# Patient Record
Sex: Male | Born: 1955
Health system: Southern US, Community
[De-identification: ages and names within clinical notes are randomized; demographics above are authoritative.]

## PROBLEM LIST (undated history)

## (undated) ENCOUNTER — Emergency Department: Admission: EM | Discharge status: Absent without leave | Payer: Medicare HMO

## (undated) DIAGNOSIS — C801 Malignant (primary) neoplasm, unspecified: Secondary | ICD-10-CM

## (undated) DIAGNOSIS — I071 Rheumatic tricuspid insufficiency: Secondary | ICD-10-CM

## (undated) DIAGNOSIS — C61 Malignant neoplasm of prostate: Secondary | ICD-10-CM

## (undated) DIAGNOSIS — K2970 Gastritis, unspecified, without bleeding: Secondary | ICD-10-CM

## (undated) DIAGNOSIS — R7303 Prediabetes: Secondary | ICD-10-CM

## (undated) DIAGNOSIS — I10 Essential (primary) hypertension: Secondary | ICD-10-CM

## (undated) DIAGNOSIS — I714 Abdominal aortic aneurysm, without rupture, unspecified: Secondary | ICD-10-CM

## (undated) DIAGNOSIS — I340 Nonrheumatic mitral (valve) insufficiency: Secondary | ICD-10-CM

## (undated) DIAGNOSIS — R319 Hematuria, unspecified: Secondary | ICD-10-CM

## (undated) DIAGNOSIS — I517 Cardiomegaly: Secondary | ICD-10-CM

## (undated) DIAGNOSIS — M1990 Unspecified osteoarthritis, unspecified site: Secondary | ICD-10-CM

## (undated) DIAGNOSIS — G4730 Sleep apnea, unspecified: Secondary | ICD-10-CM

## (undated) DIAGNOSIS — M5412 Radiculopathy, cervical region: Secondary | ICD-10-CM

## (undated) DIAGNOSIS — R252 Cramp and spasm: Secondary | ICD-10-CM

## (undated) DIAGNOSIS — K219 Gastro-esophageal reflux disease without esophagitis: Secondary | ICD-10-CM

## (undated) DIAGNOSIS — G500 Trigeminal neuralgia: Secondary | ICD-10-CM

## (undated) HISTORY — DX: Malignant neoplasm of prostate: C61

## (undated) HISTORY — PX: APPENDECTOMY: SHX54

## (undated) HISTORY — PX: HEMORROIDECTOMY: SUR656

---

## 2004-05-09 ENCOUNTER — Emergency Department: Payer: Self-pay | Admitting: Emergency Medicine

## 2004-06-27 ENCOUNTER — Ambulatory Visit: Payer: Self-pay | Admitting: Internal Medicine

## 2004-06-27 LAB — HM COLONOSCOPY

## 2005-07-08 ENCOUNTER — Ambulatory Visit: Payer: Self-pay | Admitting: Urology

## 2007-02-05 ENCOUNTER — Emergency Department: Payer: Self-pay | Admitting: Emergency Medicine

## 2007-09-23 ENCOUNTER — Other Ambulatory Visit: Payer: Self-pay

## 2007-09-24 ENCOUNTER — Inpatient Hospital Stay: Payer: Self-pay | Admitting: Surgery

## 2008-06-14 ENCOUNTER — Ambulatory Visit: Payer: Self-pay | Admitting: Family Medicine

## 2008-06-30 DIAGNOSIS — G4730 Sleep apnea, unspecified: Secondary | ICD-10-CM | POA: Insufficient documentation

## 2008-07-26 DIAGNOSIS — K219 Gastro-esophageal reflux disease without esophagitis: Secondary | ICD-10-CM | POA: Insufficient documentation

## 2008-07-26 DIAGNOSIS — M1990 Unspecified osteoarthritis, unspecified site: Secondary | ICD-10-CM | POA: Insufficient documentation

## 2008-07-31 DIAGNOSIS — Z87891 Personal history of nicotine dependence: Secondary | ICD-10-CM | POA: Insufficient documentation

## 2008-10-06 DIAGNOSIS — L723 Sebaceous cyst: Secondary | ICD-10-CM | POA: Insufficient documentation

## 2009-05-21 ENCOUNTER — Ambulatory Visit: Payer: Self-pay | Admitting: Family Medicine

## 2009-11-24 ENCOUNTER — Ambulatory Visit: Payer: Self-pay | Admitting: Internal Medicine

## 2009-11-26 DIAGNOSIS — M129 Arthropathy, unspecified: Secondary | ICD-10-CM | POA: Insufficient documentation

## 2009-12-24 DIAGNOSIS — R319 Hematuria, unspecified: Secondary | ICD-10-CM | POA: Insufficient documentation

## 2009-12-24 DIAGNOSIS — N390 Urinary tract infection, site not specified: Secondary | ICD-10-CM | POA: Insufficient documentation

## 2010-10-19 ENCOUNTER — Ambulatory Visit: Payer: Self-pay | Admitting: Family Medicine

## 2011-04-03 ENCOUNTER — Emergency Department: Payer: Self-pay | Admitting: Emergency Medicine

## 2011-06-21 ENCOUNTER — Observation Stay: Payer: Self-pay | Admitting: Internal Medicine

## 2011-06-21 DIAGNOSIS — R079 Chest pain, unspecified: Secondary | ICD-10-CM

## 2011-06-21 LAB — COMPREHENSIVE METABOLIC PANEL
Albumin: 4.4 g/dL (ref 3.4–5.0)
Alkaline Phosphatase: 98 U/L (ref 50–136)
Anion Gap: 9 (ref 7–16)
BUN: 20 mg/dL — ABNORMAL HIGH (ref 7–18)
Bilirubin,Total: 0.6 mg/dL (ref 0.2–1.0)
Calcium, Total: 9.4 mg/dL (ref 8.5–10.1)
Chloride: 105 mmol/L (ref 98–107)
Co2: 28 mmol/L (ref 21–32)
Creatinine: 1.19 mg/dL (ref 0.60–1.30)
EGFR (African American): >60
EGFR (Non-African Amer.): >60
Glucose: 154 mg/dL — ABNORMAL HIGH (ref 65–99)
Osmolality: 289 (ref 275–301)
Potassium: 4.5 mmol/L (ref 3.5–5.1)
SGOT(AST): 26 U/L (ref 15–37)
SGPT (ALT): 32 U/L
Sodium: 142 mmol/L (ref 136–145)
Total Protein: 7.7 g/dL (ref 6.4–8.2)

## 2011-06-21 LAB — CBC
HCT: 43.1 % (ref 40.0–52.0)
HGB: 14.4 g/dL (ref 13.0–18.0)
MCH: 27.5 pg (ref 26.0–34.0)
MCHC: 33.4 g/dL (ref 32.0–36.0)
MCV: 82 fL (ref 80–100)
Platelet: 213 10*3/uL (ref 150–440)
RBC: 5.23 10*6/uL (ref 4.40–5.90)
RDW: 13.6 % (ref 11.5–14.5)
WBC: 5.1 10*3/uL (ref 3.8–10.6)

## 2011-06-21 LAB — CK TOTAL AND CKMB (NOT AT ARMC)
CK, Total: 414 U/L — ABNORMAL HIGH (ref 35–232)
CK-MB: 1.6 ng/mL (ref 0.5–3.6)

## 2011-06-21 LAB — TROPONIN I: Troponin-I: <0.02 ng/mL

## 2011-06-21 LAB — PROTIME-INR
INR: 0.9
Prothrombin Time: 12.9 secs (ref 11.5–14.7)

## 2011-06-21 LAB — APTT: Activated PTT: 34.3 secs (ref 23.6–35.9)

## 2011-06-22 LAB — BASIC METABOLIC PANEL
Anion Gap: 11 (ref 7–16)
BUN: 25 mg/dL — ABNORMAL HIGH (ref 7–18)
Calcium, Total: 9.2 mg/dL (ref 8.5–10.1)
Chloride: 105 mmol/L (ref 98–107)
Co2: 28 mmol/L (ref 21–32)
Creatinine: 1.05 mg/dL (ref 0.60–1.30)
EGFR (African American): >60
EGFR (Non-African Amer.): >60
Glucose: 102 mg/dL — ABNORMAL HIGH (ref 65–99)
Osmolality: 291 (ref 275–301)
Potassium: 3.6 mmol/L (ref 3.5–5.1)
Sodium: 144 mmol/L (ref 136–145)

## 2011-06-22 LAB — CK TOTAL AND CKMB (NOT AT ARMC)
CK, Total: 375 U/L — ABNORMAL HIGH (ref 35–232)
CK, Total: 363 U/L — ABNORMAL HIGH (ref 35–232)
CK-MB: 1.6 ng/mL (ref 0.5–3.6)
CK-MB: 1.6 ng/mL (ref 0.5–3.6)

## 2011-06-22 LAB — CBC WITH DIFFERENTIAL/PLATELET
Basophil #: 0 10*3/uL (ref 0.0–0.1)
Basophil %: 0.7 %
Eosinophil #: 0.1 10*3/uL (ref 0.0–0.7)
Eosinophil %: 1.5 %
HCT: 42.4 % (ref 40.0–52.0)
HGB: 14.1 g/dL (ref 13.0–18.0)
Lymphocyte #: 3.2 10*3/uL (ref 1.0–3.6)
Lymphocyte %: 57.2 %
MCH: 27.3 pg (ref 26.0–34.0)
MCHC: 33.2 g/dL (ref 32.0–36.0)
MCV: 82 fL (ref 80–100)
Monocyte #: 0.6 10*3/uL (ref 0.0–0.7)
Monocyte %: 10.5 %
Neutrophil #: 1.7 10*3/uL (ref 1.4–6.5)
Neutrophil %: 30.1 %
Platelet: 207 10*3/uL (ref 150–440)
RBC: 5.16 10*6/uL (ref 4.40–5.90)
RDW: 13.3 % (ref 11.5–14.5)
WBC: 5.6 10*3/uL (ref 3.8–10.6)

## 2011-06-22 LAB — LIPID PANEL
Cholesterol: 178 mg/dL (ref 0–200)
HDL Cholesterol: 46 mg/dL (ref 40–60)
Ldl Cholesterol, Calc: 110 mg/dL — ABNORMAL HIGH (ref 0–100)
Triglycerides: 109 mg/dL (ref 0–200)
VLDL Cholesterol, Calc: 22 mg/dL (ref 5–40)

## 2011-06-22 LAB — TROPONIN I
Troponin-I: <0.02 ng/mL
Troponin-I: <0.02 ng/mL

## 2011-06-22 LAB — TSH: Thyroid Stimulating Horm: 2.1 u[IU]/mL

## 2011-06-22 LAB — HEMOGLOBIN A1C: Hemoglobin A1C: 6.6 % — ABNORMAL HIGH (ref 4.2–6.3)

## 2011-06-30 ENCOUNTER — Ambulatory Visit: Payer: Self-pay | Admitting: Family Medicine

## 2011-10-30 ENCOUNTER — Emergency Department: Payer: Self-pay

## 2012-01-02 ENCOUNTER — Ambulatory Visit: Payer: Self-pay | Admitting: Family Medicine

## 2012-09-04 ENCOUNTER — Ambulatory Visit: Payer: Self-pay | Admitting: Emergency Medicine

## 2012-12-31 ENCOUNTER — Ambulatory Visit: Payer: Self-pay | Admitting: Otolaryngology

## 2012-12-31 LAB — CREATININE, SERUM
Creatinine: 1.11 mg/dL (ref 0.60–1.30)
EGFR (African American): >60
EGFR (Non-African Amer.): >60

## 2013-07-05 ENCOUNTER — Ambulatory Visit: Payer: Self-pay | Admitting: Family Medicine

## 2014-07-06 LAB — CBC AND DIFFERENTIAL
HCT: 47 % (ref 41–53)
Hemoglobin: 15.6 g/dL (ref 13.5–17.5)
Neutrophils Absolute: 2 /uL
Platelets: 195 10*3/uL (ref 150–399)
WBC: 4 10^3/mL

## 2014-07-06 LAB — BASIC METABOLIC PANEL
BUN: 15 mg/dL (ref 4–21)
Creatinine: 1.1 mg/dL (ref 0.6–1.3)
Glucose: 98 mg/dL
Potassium: 4.8 mmol/L (ref 3.4–5.3)
Sodium: 142 mmol/L (ref 137–147)

## 2014-07-06 LAB — LIPID PANEL
Cholesterol: 161 mg/dL (ref 0–200)
HDL: 52 mg/dL (ref 35–70)
LDL Cholesterol: 86 mg/dL
LDl/HDL Ratio: 1.7
Triglycerides: 116 mg/dL (ref 40–160)

## 2014-07-06 LAB — HEPATIC FUNCTION PANEL
ALT: 27 U/L (ref 10–40)
AST: 26 U/L (ref 14–40)
Alkaline Phosphatase: 86 U/L (ref 25–125)

## 2014-07-06 LAB — TSH: TSH: 1.23 u[IU]/mL (ref 0.41–5.90)

## 2014-07-06 LAB — PSA: PSA: 2.7

## 2014-08-20 ENCOUNTER — Emergency Department: Payer: Self-pay | Admitting: Internal Medicine

## 2014-09-17 NOTE — Discharge Summary (Signed)
PATIENT NAME:  Jack Weaver, Jack Weaver MR#:  [id number] DATE OF BIRTH:  [birth year]  DATE OF ADMISSION:  06/21/2011 DATE OF DISCHARGE:  06/22/2011  DISCHARGE DIAGNOSES:  1. Palpitations.  2. Hypertension.  3. Prediabetes.  4. Hyperlipidemia.   DISPOSITION: The patient is being discharged home.   FOLLOW-UP: Follow-up with primary care physician, Dr. Rosanna Randy, and with Dr. Clayborn Bigness in one to two weeks after discharge.   DIET: Low sodium, ADA, low fat.   DISCHARGE MEDICATIONS:  1. Flonase 50 mcg two sprays each nostril daily.  2. Aspirin 81 mg daily.  3. Lopressor 25 mg daily.  4. Enalapril 5 mg daily.   CONSULTATION: Cardiology consultation with Dr. Clayborn Bigness   LABORATORY, DIAGNOSTIC, AND RADIOLOGICAL DATA: Chest x-ray showed no abnormalities. CBC normal. Cardiac enzymes x3 normal. Hemoglobin A1c 6.6. LDL 110. Rest of cholesterol profile normal. TSH 2.10.   HOSPITAL COURSE: The patient is a 59 year old male with past medical history of hypertension who presented with palpitations. He reported having palpitations for several months. He reports being told that he had PVCs. He came to the ED because he was having excessive palpitations. The patient was admitted to the hospital and placed on telemetry. On telemetry he had no events on telemetry. Three sets of cardiac enzymes were negative. TSH was normal. The patient was evaluated by Dr. Clayborn Bigness who recommended outpatient follow-up in his office. The patient's hypertension was well controlled. He was taking HCTZ and amlodipine at home. He has been switched to beta-blocker and ACE inhibitor given his symptoms. The patient had hyperglycemia and a hemoglobin A1c was checked which was found to be 6.6, possibly prediabetic. He has been advised about diet control. A fasting lipid profile was also checked and the patient's LDL was elevated. He has been advised about diet control. Will defer initiation of statin therapy to his PCP.  DISPOSITION: The patient is  being discharged home in a stable condition.   TIME SPENT: 45 minutes.   ____________________________ Cherre Huger, MD sp:drc D: 06/22/2011 15:41:44 ET T: 06/23/2011 10:43:54 ET JOB#: [id number]  cc: Cherre Huger, MD, <Dictator> Cherre Huger MD ELECTRONICALLY SIGNED 06/23/2011 15:11

## 2014-09-17 NOTE — H&P (Signed)
PATIENT NAME:  Jack Weaver, Jack Weaver MR#:  [id number] DATE OF BIRTH:  02/15/1956  DATE OF ADMISSION:  06/21/2011  PRIMARY CARE PHYSICIAN: Richard L. Rosanna Randy, MD   CHIEF COMPLAINT: Chest pressure.   HISTORY OF PRESENT ILLNESS: Mr. Luiz Blare is a 59 year old gentleman with hypertension who has been having chest tightness and palpitations for the past six years. He states that over the past six years the feeling of fluttering in his chest and chest tightness have come and gone. However, since November it has been more frequent and he presented to the Emergency Department. He was told he was having PVCs and sent home. He typically has them twice a day, and they are not associated with any nausea or vomiting, chest pain with radiation. They are not associated with activity. He runs 5 to 10 miles daily with no symptoms. He has worn a Holter monitor with no findings. He states that last night the fluttering in his chest and the tightness was coming back to back and very severe, but he did not come to the Emergency Department secondary to the weather. He finally came in today and states that he has only had about four episodes.   REVIEW OF SYSTEMS: CONSTITUTIONAL: He denies any weakness, weight loss or fatigue. No fevers or night sweats. HEENT: No changes in his vision, blurry vision, double vision. No changes in his hearing. No headaches or dizziness. No dysphagia, odynophagia or postnasal drip. CARDIORESPIRATORY: No shortness of breath. No chest pain, including pleuritic chest pain or wheezing. No edema, orthopnea, or PND. GASTROINTESTINAL: No nausea, vomiting, diarrhea, or constipation. No abdominal pain. HEMATOLOGIC/LYMPHATIC: No easy bruising, easy bleeding or swollen glands. ENDOCRINE: No polyuria, polydipsia or polyphagia. No heat or cold intolerance. MUSCULOSKELETAL: No weakness or swollen joints. No paresthesias. INTEGUMENTARY: No changes in his skin, hair, or nails. No rashes or lesions. PSYCHIATRIC: No anxiety or  depression.   PAST MEDICAL HISTORY: Hypertension   FAMILY HISTORY: Significant for a myocardial infarction in his mother in her 73s and hypertension in his father. No hyperlipidemia. No sudden cardiac death.   SOCIAL HISTORY: He lives with his wife. He smokes one cigar per day. Occasional alcohol. He denies any illicit drugs.   CODE STATUS:  He is a FULL CODE.    ALLERGIES: He has no known drug allergies.   HOME MEDICATIONS:  1. Naproxen 500 mg p.o. b.i.d. 2. Meloxicam 15 mg p.o. daily.  3. HCTZ 12.5 mg p.o. daily.  4. Amlodipine 10 mg p.o. daily.  5. Fluticasone nasal spray once a day.   PHYSICAL EXAMINATION:  VITAL SIGNS: On admission, temperature is 98.2, pulse is 61, respiratory rate 20, blood pressure 132/85, oxygen saturation is 96% on room air.   GENERAL: He is in no acute distress, well-developed, well-nourished.   HEENT: Conjunctivae and lids are without erythema or pallor. Pupils are equally round and reactive to light and accommodation. Sclera are injected but anicteric. External ears are without lesions or masses. Hearing is intact to whispered voice. Oral mucosa is moist. Posterior pharynx is clear without exudate.   NECK: Supple without masses. Thyroid is not enlarged. There are no nodules.   LUNGS: He is not in respiratory distress. No intercostal retractions or use of accessory muscles. Lungs are clear throughout. No wheezing, rales or rhonchi.   HEART: Heart is regular. No murmurs, rubs, or gallops. Pedal pulses are 2+ and equal bilaterally. No lower extremity edema.   ABDOMEN: Soft, nontender, nondistended. Liver and spleen are not enlarged.  LYMPH NODES: No axillary or cervical lymphadenopathy.   EXTREMITIES: Full range of motion in all four extremities with good strength and tone. No clubbing, cyanosis or edema.  SKIN: Skin and subcutaneous tissue are without rashes or lesions. Palpation of the skin reveals no induration or nodules.   NEUROLOGIC: Cranial  nerves II through XII are grossly intact. Deep tendon reflexes are 2+ and equal bilaterally.   PSYCHIATRIC: Judgment and insight are intact. The patient is oriented to person, place, and time.   LABORATORY, DIAGNOSTIC AND RADIOLOGICAL DATA:  White blood cell count 5.1, hematocrit 43.1, platelet count 213.  Sodium 142, potassium 4.5, chloride 105, bicarbonate 28, BUN 20, creatinine 1.2, glucose 154. INR is flat. Troponin is less than 0.02. CK is 414, MB is 1.6.  EKG is normal sinus rhythm with a PVC and what looks to be voltage criteria for left ventricular hypertrophy with a strain pattern.   ASSESSMENT/PLAN: A 59 year old man with hypertension who presents with palpitations and chest pressure.   1. Chest pressure and palpitations: We will keep him on telemetry. Lovenox, aspirin, beta blocker and ACE inhibitor. Cardiology consult. He could possibly benefit from a stress test, although he does one on a regular basis.  2. Hypertension: I did hold his Norvasc and HCTZ given that he is on a beta blocker and an ACE inhibitor while being worked up for angina. He does have an enlarged heart on EKG. He says he had an echocardiogram, but that is not found in the system, and he may benefit from one at this point.   TIME SPENT:   Total time spent on care and coordination is 40 minutes.    ____________________________ Stephani Police Owens Shark, MD jlb:cbb D: 06/21/2011 91:66:06 ET T: 06/22/2011 10:25:45 ET JOB#: [id number]  cc: Anderson Malta L. Owens Shark, MD, <Dictator> Richard L. Rosanna Randy, MD Osa Craver MD ELECTRONICALLY SIGNED 06/24/2011 13:37

## 2014-11-11 ENCOUNTER — Other Ambulatory Visit: Payer: Self-pay | Admitting: Family Medicine

## 2014-11-14 ENCOUNTER — Other Ambulatory Visit: Payer: Self-pay | Admitting: Family Medicine

## 2014-11-14 ENCOUNTER — Encounter: Payer: Self-pay | Admitting: Family Medicine

## 2014-11-14 DIAGNOSIS — J309 Allergic rhinitis, unspecified: Secondary | ICD-10-CM | POA: Insufficient documentation

## 2014-11-18 DIAGNOSIS — I10 Essential (primary) hypertension: Secondary | ICD-10-CM | POA: Insufficient documentation

## 2014-11-18 DIAGNOSIS — N529 Male erectile dysfunction, unspecified: Secondary | ICD-10-CM | POA: Insufficient documentation

## 2014-11-18 DIAGNOSIS — G629 Polyneuropathy, unspecified: Secondary | ICD-10-CM | POA: Insufficient documentation

## 2014-11-26 ENCOUNTER — Other Ambulatory Visit: Payer: Self-pay | Admitting: Family Medicine

## 2014-12-05 ENCOUNTER — Ambulatory Visit: Payer: Self-pay | Admitting: Family Medicine

## 2015-01-15 ENCOUNTER — Encounter: Payer: Self-pay | Admitting: Emergency Medicine

## 2015-01-15 ENCOUNTER — Emergency Department
Admission: EM | Admit: 2015-01-15 | Discharge: 2015-01-15 | Disposition: A | Discharge status: Home / Self Care | Payer: Commercial Indemnity | Attending: Emergency Medicine | Admitting: Emergency Medicine

## 2015-01-15 DIAGNOSIS — Z720 Tobacco use: Secondary | ICD-10-CM | POA: Diagnosis not present

## 2015-01-15 DIAGNOSIS — H8110 Benign paroxysmal vertigo, unspecified ear: Secondary | ICD-10-CM | POA: Insufficient documentation

## 2015-01-15 DIAGNOSIS — Z79899 Other long term (current) drug therapy: Secondary | ICD-10-CM | POA: Diagnosis not present

## 2015-01-15 DIAGNOSIS — R42 Dizziness and giddiness: Secondary | ICD-10-CM | POA: Diagnosis present

## 2015-01-15 DIAGNOSIS — Z7951 Long term (current) use of inhaled steroids: Secondary | ICD-10-CM | POA: Insufficient documentation

## 2015-01-15 LAB — BASIC METABOLIC PANEL
Anion gap: 7 (ref 5–15)
BUN: 23 mg/dL — ABNORMAL HIGH (ref 6–20)
CO2: 29 mmol/L (ref 22–32)
Calcium: 9.3 mg/dL (ref 8.9–10.3)
Chloride: 108 mmol/L (ref 101–111)
Creatinine, Ser: 1.12 mg/dL (ref 0.61–1.24)
GFR calc Af Amer: >60 mL/min (ref >60)
GFR calc non Af Amer: >60 mL/min (ref >60)
Glucose, Bld: 116 mg/dL — ABNORMAL HIGH (ref 65–99)
Potassium: 3.8 mmol/L (ref 3.5–5.1)
Sodium: 144 mmol/L (ref 135–145)

## 2015-01-15 LAB — CBC
HCT: 41.6 % (ref 40.0–52.0)
Hemoglobin: 13.6 g/dL (ref 13.0–18.0)
MCH: 27.4 pg (ref 26.0–34.0)
MCHC: 32.8 g/dL (ref 32.0–36.0)
MCV: 83.7 fL (ref 80.0–100.0)
Platelets: 164 10*3/uL (ref 150–440)
RBC: 4.97 MIL/uL (ref 4.40–5.90)
RDW: 13.6 % (ref 11.5–14.5)
WBC: 6.1 10*3/uL (ref 3.8–10.6)

## 2015-01-15 LAB — URINALYSIS COMPLETE WITH MICROSCOPIC (ARMC ONLY)
Bacteria, UA: NONE SEEN
Bilirubin Urine: NEGATIVE
Glucose, UA: NEGATIVE mg/dL
Leukocytes, UA: NEGATIVE
Nitrite: NEGATIVE
Protein, ur: NEGATIVE mg/dL
Specific Gravity, Urine: 1.019 (ref 1.005–1.030)
pH: 7 (ref 5.0–8.0)

## 2015-01-15 MED ORDER — PROMETHAZINE HCL 12.5 MG PO TABS: 12.5000 mg | ORAL_TABLET | Freq: Four times a day (QID) | ORAL | Status: DC | PRN

## 2015-01-15 MED ORDER — MECLIZINE HCL 25 MG PO TABS
25.0000 mg | ORAL_TABLET | Freq: Once | ORAL | Status: AC
  Administered 2015-01-15: 25 mg via ORAL
  Filled 2015-01-15: qty 1

## 2015-01-15 MED ORDER — PROMETHAZINE HCL 25 MG PO TABS
12.5000 mg | ORAL_TABLET | Freq: Once | ORAL | Status: AC
  Administered 2015-01-15: 12.5 mg via ORAL
  Filled 2015-01-15: qty 1

## 2015-01-15 MED ORDER — ONDANSETRON 4 MG PO TBDP
4.0000 mg | ORAL_TABLET | Freq: Once | ORAL | Status: AC
  Administered 2015-01-15: 4 mg via ORAL
  Filled 2015-01-15: qty 1

## 2015-01-15 MED ORDER — MECLIZINE HCL 25 MG PO TABS
25.0000 mg | ORAL_TABLET | Freq: Three times a day (TID) | ORAL | Status: DC | PRN
Start: 2015-01-15 — End: 2015-02-06

## 2015-01-15 NOTE — ED Notes (Signed)
Pt brought to triage via wheelchair from the lobby bathroom where pt had gone to vomit. Registration clerk reports pt has been in the bathroom vomiting. Pt reports started around midnight with feeling dizzy and then started vomiting.

## 2015-01-15 NOTE — ED Provider Notes (Signed)
Time Seen: Approximately ----------------------------------------- 7:28 AM on 01/15/2015 -----------------------------------------    I have reviewed the triage notes  Chief Complaint: Dizziness   History of Present Illness: Jack Weaver is a 59 y.o. male who presents with acute onset of vertiginous symptoms. Patient states he felt fine up until 12:30 this morning. He states he is feeling symptomatically improved at this point as far as his nausea and vomiting goes which was rather persistent. Patient received a Zofran to triage area. He denies any head trauma, ringing in the ears, trouble with speech or swallowing, he denies any focal weakness either upper or lower extremities. He denies any ear pain or deafness. He denies any facial pain or facial weakness. He denies any recent tick bite or exposure tickborne disease. He states he had persistent vomiting without any hematemesis or biliary emesis. He denies any chest pain or shortness of breath, visual acuity issues etc. Feelings of being off balance or only with movement and her exhaustive when the patient holds still.   History reviewed. No pertinent past medical history.  Patient Active Problem List   Diagnosis Date Noted  . ED (erectile dysfunction) of organic origin 11/18/2014  . BP (high blood pressure) 11/18/2014  . Neuropathy 11/18/2014  . Allergic rhinitis 11/14/2014  . Arthropathia 11/26/2009  . History of tobacco use 07/31/2008  . Acid reflux 07/26/2008  . Arthritis, degenerative 07/26/2008  . Apnea, sleep 06/30/2008    Past Surgical History  Procedure Laterality Date  . Hemorroidectomy  1930  . Appendectomy      At age 41    Past Surgical History  Procedure Laterality Date  . Hemorroidectomy  1930  . Appendectomy      At age 33    Current Outpatient Rx  Name  Route  Sig  Dispense  Refill  . diltiazem (DILACOR XR) 120 MG 24 hr capsule   Oral   Take 1 capsule by mouth daily.         . fluticasone  (FLONASE) 50 MCG/ACT nasal spray      USE 2 SPRAYS EACH NOSTRIL EVERY DAY   16 g   3   . lisinopril (PRINIVIL,ZESTRIL) 40 MG tablet   Oral   Take 1 tablet by mouth daily.         . meloxicam (MOBIC) 15 MG tablet      TAKE 1 TABLET BY MOUTH EVERY DAY AFTER MEALS      3   . metoprolol succinate (TOPROL-XL) 25 MG 24 hr tablet   Oral   Take 25 mg by mouth daily.      12   . MULTIPLE VITAMIN PO   Oral   Take 1 tablet by mouth daily.         . naproxen (NAPROSYN) 500 MG tablet   Oral   Take 1 tablet by mouth 2 (two) times daily as needed.         . sildenafil (VIAGRA) 100 MG tablet   Oral   Take 1 tablet by mouth daily as needed.         . tadalafil (CIALIS) 20 MG tablet   Oral   Take 20 mg by mouth daily as needed for erectile dysfunction.         . traMADol (ULTRAM) 50 MG tablet   Oral   Take 50 mg by mouth every 6 (six) hours as needed. for pain      0     Allergies:  Review of  patient's allergies indicates no known allergies.  Family History: Family History  Problem Relation Age of Onset  . Heart attack Mother   . Stroke Father   . Asthma Father   . Cancer Brother   . Diabetes Brother     Social History: Social History  Substance Use Topics  . Smoking status: Light Tobacco Smoker  . Smokeless tobacco: None     Comment: quit in 06/2013. Pt recently started 1 cigarette daily.  . Alcohol Use: 0.0 oz/week    0 Standard drinks or equivalent per week     Review of Systems:   10 point review of systems was performed and was otherwise negative:  Constitutional: No fever Eyes: No visual disturbances ENT: No sore throat, ear pain Cardiac: No chest pain Respiratory: No shortness of breath, wheezing, or stridor Abdomen: No abdominal pain, no vomiting, No diarrhea Endocrine: No weight loss, No night sweats Extremities: No peripheral edema, cyanosis Skin: No rashes, easy bruising Neurologic: No focal weakness, trouble with speech or  swollowing Urologic: No dysuria, Hematuria, or urinary frequency   Physical Exam:  ED Triage Vitals  Enc Vitals Group     BP 01/15/15 0207 135/89 mmHg     Pulse Rate 01/15/15 0207 59     Resp 01/15/15 0207 18     Temp 01/15/15 0207 97.8 F (36.6 C)     Temp Source 01/15/15 0207 Oral     SpO2 01/15/15 0207 100 %     Weight 01/15/15 0207 202 lb (91.627 kg)     Height 01/15/15 0207 5\' 11"  (1.803 m)     Head Cir --      Peak Flow --      Pain Score 01/15/15 0208 0     Pain Loc --      Pain Edu? --      Excl. in [place]? --     General: Awake , Alert , and Oriented times 3; GCS 15 Head: Normal cephalic , atraumatic Eyes: Pupils equal , round, reactive to light Nose/Throat: No nasal drainage, patent upper airway without erythema or exudate.  Neck: Supple, Full range of motion, No anterior adenopathy or palpable thyroid masses Lungs: Clear to ascultation without wheezes , rhonchi, or rales Heart: Regular rate, regular rhythm without murmurs , gallops , or rubs Abdomen: Soft, non tender without rebound, guarding , or rigidity; bowel sounds positive and symmetric in all 4 quadrants. No organomegaly .        Extremities: 2 plus symmetric pulses. No edema, clubbing or cyanosis Neurologic: normal ambulation, Motor symmetric without deficits, sensory intact Skin: warm, dry, no rashes   Labs:   All laboratory work was reviewed including any pertinent negatives or positives listed below:  [place] - Abnormal; Notable for the following:    Glucose, Bld 116 (*)    BUN 23 (*)    All other components within normal limits  URINALYSIS COMPLETEWITH MICROSCOPIC ([facility] ONLY) - Abnormal; Notable for the following:    Color, Urine YELLOW (*)    APPearance CLEAR (*)    Ketones, ur TRACE (*)    Hgb urine dipstick 1+ (*)    Squamous Epithelial / LPF 0-5 (*)    All other components within normal limits  CBC   laboratory work was reviewed and showed no significant  abnormalities  EKG:ED ECG REPORT I, Daymon Larsen, the attending physician, personally viewed and interpreted this ECG.  Date: 01/15/2015 EKG Time: 0221 Rate: 53  Rhythm: normal sinus rhythm QRS Axis: normal Intervals: normal ST/T Wave abnormalities: normal Conduction Disutrbances: none Narrative Interpretation: unremarkable     Procedures: Patient had Epley maneuver with reproduction of symptoms   Critical Care: None    ED Course:  Patient was symptomatically improved though still had some vertiginous symptoms but most of his nausea and vomiting had resolved. His differential narrowed to vertigo versus non-syncope. Given his clinical presentation and objective findings I felt most likely this was dizziness consistent with benign positional vertigo. I do not clinically suspect any central vertigo given exhaustive nature of his symptoms reproduction of his symptoms with Epley maneuver and a nonfocal neurologic exam. Patient was referred back to his primary physician and given a prescription for Phenergan and Antivert.    Assessment: Benign positional vertigo   Final Clinical Impression: Benign positional vertigo Final diagnoses:  None     Plan: Patient was discharged with vertigo instructions. Advised drink plenty of fluids and advance diet as tolerated. He is advised not to drive until symptoms resolve. He was given a work note.          Daymon Larsen, MD 01/15/15 [phone]

## 2015-01-15 NOTE — Discharge Instructions (Signed)
Benign Positional Vertigo Vertigo means you feel like you or your surroundings are moving when they are not. Benign positional vertigo is the most common form of vertigo. Benign means that the cause of your condition is not serious. Benign positional vertigo is more common in older adults. CAUSES  Benign positional vertigo is the result of an upset in the labyrinth system. This is an area in the middle ear that helps control your balance. This may be caused by a viral infection, head injury, or repetitive motion. However, often no specific cause is found. SYMPTOMS  Symptoms of benign positional vertigo occur when you move your head or eyes in different directions. Some of the symptoms may include:  Loss of balance and falls.  Vomiting.  Blurred vision.  Dizziness.  Nausea.  Involuntary eye movements (nystagmus). DIAGNOSIS  Benign positional vertigo is usually diagnosed by physical exam. If the specific cause of your benign positional vertigo is unknown, your caregiver may perform imaging tests, such as magnetic resonance imaging (MRI) or computed tomography (CT). TREATMENT  Your caregiver may recommend movements or procedures to correct the benign positional vertigo. Medicines such as meclizine, benzodiazepines, and medicines for nausea may be used to treat your symptoms. In rare cases, if your symptoms are caused by certain conditions that affect the inner ear, you may need surgery. HOME CARE INSTRUCTIONS   Follow your caregiver's instructions.  Move slowly. Do not make sudden body or head movements.  Avoid driving.  Avoid operating heavy machinery.  Avoid performing any tasks that would be dangerous to you or others during a vertigo episode.  Drink enough fluids to keep your urine clear or pale yellow. SEEK IMMEDIATE MEDICAL CARE IF:   You develop problems with walking, weakness, numbness, or using your arms, hands, or legs.  You have difficulty speaking.  You develop  severe headaches.  Your nausea or vomiting continues or gets worse.  You develop visual changes.  Your family or friends notice any behavioral changes.  Your condition gets worse.  You have a fever.  You develop a stiff neck or sensitivity to light. MAKE SURE YOU:   Understand these instructions.  Will watch your condition.  Will get help right away if you are not doing well or get worse. Document Released: 02/17/2006 Document Revised: 08/04/2011 Document Reviewed: 01/30/2011 [facility] Patient Information 2015 [place], [place]. This information is not intended to replace advice given to you by your health care provider. Make sure you discuss any questions you have with your health care provider.   Please return immediately if condition worsens. Please contact her primary physician or the physician you were given for referral. If you have any specialist physicians involved in her treatment and plan please also contact them. Thank you for using [facility].

## 2015-01-26 DIAGNOSIS — J301 Allergic rhinitis due to pollen: Secondary | ICD-10-CM | POA: Insufficient documentation

## 2015-01-26 DIAGNOSIS — R42 Dizziness and giddiness: Secondary | ICD-10-CM | POA: Insufficient documentation

## 2015-02-06 ENCOUNTER — Ambulatory Visit (INDEPENDENT_AMBULATORY_CARE_PROVIDER_SITE_OTHER): Payer: Commercial Indemnity | Admitting: Family Medicine

## 2015-02-06 ENCOUNTER — Encounter: Payer: Self-pay | Admitting: Family Medicine

## 2015-02-06 VITALS — BP 118/64 | HR 54 | Temp 98.2°F | Resp 16 | Wt 212.0 lb

## 2015-02-06 DIAGNOSIS — J018 Other acute sinusitis: Secondary | ICD-10-CM | POA: Diagnosis not present

## 2015-02-06 DIAGNOSIS — R42 Dizziness and giddiness: Secondary | ICD-10-CM

## 2015-02-06 MED ORDER — MECLIZINE HCL 25 MG PO TABS: 25.0000 mg | ORAL_TABLET | Freq: Three times a day (TID) | ORAL | Status: AC | PRN

## 2015-02-06 MED ORDER — CLARITHROMYCIN 500 MG PO TABS: 500.0000 mg | ORAL_TABLET | Freq: Two times a day (BID) | ORAL | Status: DC

## 2015-02-06 MED ORDER — PREDNISONE 10 MG (48) PO TBPK: ORAL_TABLET | Freq: Every day | ORAL | Status: DC

## 2015-02-06 NOTE — Progress Notes (Signed)
Patient ID: Jack Weaver, male   DOB: 14-Dec-1955, 59 y.o.   MRN: [id number]    Subjective:  HPI Pt reports that he is dizzy when he moves his head from side to side or up and down. He was seen by ENT on 01/26/15 and they treated him for sinusitis with Augmentin and prednisone taper. He reports that did not help. Per pt they tested him for vertigo but said he did not have that. He took Meclizine that they gave him and he was better, but he ran out of that. He also reports that he has sinus pain/ pressure above his left eye. Pt reports that he is only dizzy when he turns his head.  Prior to Admission medications   Medication Sig Start Date End Date Taking? Authorizing Provider  diltiazem (DILACOR XR) 120 MG 24 hr capsule Take 1 capsule by mouth daily. 10/16/14  Yes Historical Provider, MD  fluticasone (FLONASE) 50 MCG/ACT nasal spray USE 2 SPRAYS EACH NOSTRIL EVERY DAY 11/28/14  Yes Dennis E Chrismon, PA  lisinopril (PRINIVIL,ZESTRIL) 40 MG tablet Take 1 tablet by mouth daily. 08/03/14  Yes Historical Provider, MD  meloxicam (MOBIC) 15 MG tablet TAKE 1 TABLET BY MOUTH EVERY DAY AFTER MEALS 10/16/14  Yes Historical Provider, MD  MULTIPLE VITAMIN PO Take 1 tablet by mouth daily.   Yes Historical Provider, MD    Patient Active Problem List   Diagnosis Date Noted  . ED (erectile dysfunction) of organic origin 11/18/2014  . BP (high blood pressure) 11/18/2014  . Neuropathy 11/18/2014  . Allergic rhinitis 11/14/2014  . Arthropathia 11/26/2009  . History of tobacco use 07/31/2008  . Acid reflux 07/26/2008  . Arthritis, degenerative 07/26/2008  . Apnea, sleep 06/30/2008    History reviewed. No pertinent past medical history.  Social History   Social History  . Marital Status: Married    Spouse Name: N/A  . Number of Children: N/A  . Years of Education: N/A   Occupational History  . Not on file.   Social History Main Topics  . Smoking status: Current Some Day Smoker  . Smokeless tobacco:  Not on file     Comment: quit in 06/2013. Pt recently started 1 cigarette daily. Now smokes once a week.  . Alcohol Use: 0.0 oz/week    0 Standard drinks or equivalent per week     Comment: occasionally  . Drug Use: No  . Sexual Activity: Not on file   Other Topics Concern  . Not on file   Social History Narrative    No Known Allergies  Review of Systems  Constitutional: Negative.   Eyes: Negative.   Respiratory: Negative.   Cardiovascular: Negative.   Gastrointestinal: Negative.   Genitourinary: Negative.   Musculoskeletal: Negative.   Skin: Negative.   Neurological: Positive for dizziness and headaches.  Endo/Heme/Allergies: Negative.   Psychiatric/Behavioral: Negative.     Immunization History  Administered Date(s) Administered  . Td 05/26/1989  . Tdap 12/24/2009   Objective:  BP 118/64 mmHg  Pulse 54  Temp(Src) 98.2 F (36.8 C) (Oral)  Resp 16  Wt 212 lb (96.163 kg)  Physical Exam  Constitutional: He is oriented to person, place, and time and well-developed, well-nourished, and in no distress.  HENT:  Head: Normocephalic and atraumatic.  Right Ear: External ear normal.  Left Ear: External ear normal.  Nose: Nose normal.  Mouth/Throat: Oropharynx is clear and moist.  Eyes: Conjunctivae are normal.  Neck: Neck supple.  Cardiovascular: Normal rate, regular  rhythm and normal heart sounds.   Pulmonary/Chest: Effort normal and breath sounds normal.  Abdominal: Soft.  Neurological: He is alert and oriented to person, place, and time. Gait normal.  Skin: Skin is warm and dry.  Psychiatric: Mood, memory, affect and judgment normal.    Lab Results  Component Value Date   WBC 6.1 01/15/2015   HGB 13.6 01/15/2015   HCT 41.6 01/15/2015   PLT 164 01/15/2015   GLUCOSE 116* 01/15/2015   CHOL 161 07/06/2014   TRIG 116 07/06/2014   HDL 52 07/06/2014   LDLCALC 86 07/06/2014   TSH 1.23 07/06/2014   PSA 2.7 07/06/2014   INR 0.9 06/21/2011   HGBA1C 6.6*  06/22/2011    CMP     Component Value Date/Time   NA 144 01/15/2015 0234   NA 142 07/06/2014   NA 144 06/22/2011 0025   K 3.8 01/15/2015 0234   K 3.6 06/22/2011 0025   CL 108 01/15/2015 0234   CL 105 06/22/2011 0025   CO2 29 01/15/2015 0234   CO2 28 06/22/2011 0025   GLUCOSE 116* 01/15/2015 0234   GLUCOSE 102* 06/22/2011 0025   BUN 23* 01/15/2015 0234   BUN 15 07/06/2014   BUN 25* 06/22/2011 0025   CREATININE 1.12 01/15/2015 0234   CREATININE 1.1 07/06/2014   CREATININE 1.11 12/31/2012 0708   CALCIUM 9.3 01/15/2015 0234   CALCIUM 9.2 06/22/2011 0025   PROT 7.7 06/21/2011 1622   ALBUMIN 4.4 06/21/2011 1622   AST 26 07/06/2014   AST 26 06/21/2011 1622   ALT 27 07/06/2014   ALT 32 06/21/2011 1622   ALKPHOS 86 07/06/2014   ALKPHOS 98 06/21/2011 1622   BILITOT 0.6 06/21/2011 1622   GFRNONAA >60 01/15/2015 0234   GFRNONAA >60 12/31/2012 0708   GFRNONAA >60 06/22/2011 0025   GFRAA >60 01/15/2015 0234   GFRAA >60 12/31/2012 0708   GFRAA >60 06/22/2011 0025    Assessment and Plan :  1. Vertigo  - meclizine (ANTIVERT) 25 MG tablet; Take 1 tablet (25 mg total) by mouth 3 (three) times daily as needed for dizziness.  Dispense: 100 tablet; Refill: 0  2. Other acute sinusitis  - clarithromycin (BIAXIN) 500 MG tablet; Take 1 tablet (500 mg total) by mouth 2 (two) times daily.  Dispense: 30 tablet; Refill: 0 - predniSONE (STERAPRED UNI-PAK 48 TAB) 10 MG (48) TBPK tablet; Take by mouth daily. Use as directed.  Dispense: 48 tablet; Refill: [place] MD [facility] 02/06/2015 3:38 PM

## 2015-02-18 ENCOUNTER — Encounter (HOSPITAL_BASED_OUTPATIENT_CLINIC_OR_DEPARTMENT_OTHER): Payer: Self-pay | Admitting: *Deleted

## 2015-02-18 ENCOUNTER — Emergency Department (HOSPITAL_BASED_OUTPATIENT_CLINIC_OR_DEPARTMENT_OTHER)
Admission: EM | Admit: 2015-02-18 | Discharge: 2015-02-18 | Disposition: A | Discharge status: Home / Self Care | Payer: Managed Care, Other (non HMO) | Attending: Emergency Medicine | Admitting: Emergency Medicine

## 2015-02-18 DIAGNOSIS — Z7951 Long term (current) use of inhaled steroids: Secondary | ICD-10-CM | POA: Diagnosis not present

## 2015-02-18 DIAGNOSIS — Z720 Tobacco use: Secondary | ICD-10-CM | POA: Diagnosis not present

## 2015-02-18 DIAGNOSIS — I10 Essential (primary) hypertension: Secondary | ICD-10-CM | POA: Diagnosis not present

## 2015-02-18 DIAGNOSIS — R42 Dizziness and giddiness: Secondary | ICD-10-CM | POA: Insufficient documentation

## 2015-02-18 DIAGNOSIS — R002 Palpitations: Secondary | ICD-10-CM

## 2015-02-18 DIAGNOSIS — Z79899 Other long term (current) drug therapy: Secondary | ICD-10-CM | POA: Diagnosis not present

## 2015-02-18 DIAGNOSIS — R008 Other abnormalities of heart beat: Secondary | ICD-10-CM | POA: Insufficient documentation

## 2015-02-18 DIAGNOSIS — Z792 Long term (current) use of antibiotics: Secondary | ICD-10-CM | POA: Insufficient documentation

## 2015-02-18 DIAGNOSIS — Z791 Long term (current) use of non-steroidal anti-inflammatories (NSAID): Secondary | ICD-10-CM | POA: Diagnosis not present

## 2015-02-18 DIAGNOSIS — I493 Ventricular premature depolarization: Secondary | ICD-10-CM

## 2015-02-18 DIAGNOSIS — I499 Cardiac arrhythmia, unspecified: Secondary | ICD-10-CM | POA: Diagnosis present

## 2015-02-18 HISTORY — DX: Essential (primary) hypertension: I10

## 2015-02-18 LAB — MAGNESIUM: Magnesium: 2.1 mg/dL (ref 1.7–2.4)

## 2015-02-18 LAB — CBC
HCT: 44.4 % (ref 39.0–52.0)
Hemoglobin: 14.8 g/dL (ref 13.0–17.0)
MCH: 27.7 pg (ref 26.0–34.0)
MCHC: 33.3 g/dL (ref 30.0–36.0)
MCV: 83 fL (ref 78.0–100.0)
Platelets: 177 10*3/uL (ref 150–400)
RBC: 5.35 MIL/uL (ref 4.22–5.81)
RDW: 14 % (ref 11.5–15.5)
WBC: 5 10*3/uL (ref 4.0–10.5)

## 2015-02-18 LAB — BASIC METABOLIC PANEL
Anion gap: 5 (ref 5–15)
BUN: 19 mg/dL (ref 6–20)
CO2: 26 mmol/L (ref 22–32)
Calcium: 9.2 mg/dL (ref 8.9–10.3)
Chloride: 107 mmol/L (ref 101–111)
Creatinine, Ser: 1.1 mg/dL (ref 0.61–1.24)
GFR calc Af Amer: >60 mL/min (ref >60)
GFR calc non Af Amer: >60 mL/min (ref >60)
Glucose, Bld: 107 mg/dL — ABNORMAL HIGH (ref 65–99)
Potassium: 4.7 mmol/L (ref 3.5–5.1)
Sodium: 138 mmol/L (ref 135–145)

## 2015-02-18 LAB — TROPONIN I: Troponin I: <0.03 ng/mL (ref <0.031)

## 2015-02-18 NOTE — ED Notes (Signed)
Pt reports 2-3 weeks of feeling light-headed and palpitations. Seen at Urgent Care today and sent here for further evaluation

## 2015-02-18 NOTE — ED Notes (Signed)
Resident MD at bedside.

## 2015-02-18 NOTE — Discharge Instructions (Signed)
Blood work up in ED was unremarkable for any cardiac damage or elecytrolyte abnormalities Please follow-up with your PCP or Cardiologist for further evaluation.  Possible need for Holter monitor Review below information. Seek help if you get faint, have chest pain, shortness of breath, or become altered.   Premature Ventricular Contraction Premature ventricular contraction (PVC) is an irregularity of the heart rhythm involving extra or skipped heartbeats. In some cases, they may occur without obvious cause or heart disease. Other times, they can be caused by an electrolyte change in the blood. These need to be corrected. They can also be seen when there is not enough oxygen going to the heart. A common cause of this is plaque or cholesterol buildup. This buildup decreases the blood supply to the heart. In addition, extra beats may be caused or aggravated by:  Excessive smoking.  Alcohol consumption.  Caffeine.  Certain medications  Some street drugs. SYMPTOMS   The sensation of feeling your heart skipping a beat (palpitations).  In many cases, the person may have no symptoms. SIGNS AND TESTS   A physical examination may show an occasional irregularity, but if the PVC beats do not happen often, they may not be found on physical exam.  Blood pressure is usually normal.  Other tests that may find extra beats of the heart are:  An EKG (electrocardiogram)  A Holter monitor which can monitor your heart over longer periods of time  An Angiogram (study of the heart arteries). TREATMENT  Usually extra heartbeats do not need treatment. The condition is treated only if symptoms are severe or if extra beats are very frequent or are causing problems. An underlying cause, if discovered, may also require treatment.  Treatment may also be needed if there may be a risk for other more serious cardiac arrhythmias.  PREVENTION   Moderation in caffeine, alcohol, and tobacco use may reduce the  risk of ectopic heartbeats in some people.  Exercise often helps people who lead a sedentary (inactive) lifestyle. PROGNOSIS  PVC heartbeats are generally harmless and do not need treatment.  RISKS AND COMPLICATIONS   Ventricular tachycardia (occasionally).  There usually are no complications.  Other arrhythmias (occasionally). SEEK IMMEDIATE MEDICAL CARE IF:   You feel palpitations that are frequent or continual.  You develop chest pain or other problems such as shortness of breath, sweating, or nausea and vomiting.  You become light-headed or faint (pass out).  You get worse or do not improve with treatment. Document Released: 12/28/2003 Document Revised: 08/04/2011 Document Reviewed: 07/09/2007 [facility] Patient Information 2015 [place], [place]. This information is not intended to replace advice given to you by your health care provider. Make sure you discuss any questions you have with your health care provider.

## 2015-02-18 NOTE — ED Provider Notes (Signed)
CSN: [id number]     Arrival date & time 02/18/15  1120 History   First MD Initiated Contact with Patient 02/18/15 1131     Chief Complaint  Patient presents with  . Irregular Heart Beat    HPI Jack Weaver is a 59 y.o. male who presents to the ED with complaints of heart flutter. He states that his heart is beating irregularly. Says this has been going on for 3 weeks and feels that the episodes are getting more frequent. He has associated lightheadedness and pressure when episodes occur. Episodes last for about 10 seconds.Feelins as if his heart is skipping a beat. Does follow with cardiology as an outpatient. Patient was seen at urgent care this morning and was sent here for further workup of irregular heart rhythm.  Past Medical History  Diagnosis Date  . Hypertension    Past Surgical History  Procedure Laterality Date  . Hemorroidectomy  1930  . Appendectomy      At age 70   Family History  Problem Relation Age of Onset  . Heart attack Mother   . Stroke Father   . Asthma Father   . Cancer Brother   . Diabetes Brother    Social History  Substance Use Topics  . Smoking status: Current Some Day Smoker    Types: Cigars  . Smokeless tobacco: Never Used     Comment: quit in 06/2013. Pt recently started 1 cigarette daily. Now smokes once a week.  . Alcohol Use: 0.0 oz/week    0 Standard drinks or equivalent per week     Comment: Wine 5x week    Review of Systems  Constitutional: Negative for fever.  Respiratory: Negative for shortness of breath.   Cardiovascular: Positive for palpitations. Negative for chest pain.  Gastrointestinal: Negative for abdominal distention.  Neurological: Positive for light-headedness. Negative for syncope.  Also per HPI  Allergies  Review of patient's allergies indicates no known allergies.  Home Medications   Prior to Admission medications   Medication Sig Start Date End Date Taking? Authorizing Provider  clarithromycin (BIAXIN) 500 MG  tablet Take 1 tablet (500 mg total) by mouth 2 (two) times daily. 02/06/15  Yes Richard Maceo Pro., MD  diltiazem (DILACOR XR) 120 MG 24 hr capsule Take 1 capsule by mouth daily. 10/16/14  Yes Historical Provider, MD  lisinopril (PRINIVIL,ZESTRIL) 40 MG tablet Take 1 tablet by mouth daily. 08/03/14  Yes Historical Provider, MD  meloxicam (MOBIC) 15 MG tablet TAKE 1 TABLET BY MOUTH EVERY DAY AFTER MEALS 10/16/14  Yes Historical Provider, MD  MULTIPLE VITAMIN PO Take 1 tablet by mouth daily.   Yes Historical Provider, MD  fluticasone (FLONASE) 50 MCG/ACT nasal spray USE 2 SPRAYS EACH NOSTRIL EVERY DAY 11/28/14   Vickki Muff Chrismon, PA  predniSONE (STERAPRED UNI-PAK 48 TAB) 10 MG (48) TBPK tablet Take by mouth daily. Use as directed. 02/06/15   Richard Maceo Pro., MD   BP 143/85 mmHg  Pulse 56  Temp(Src) 98.5 F (36.9 C) (Oral)  Resp 25  Ht 6' (1.829 m)  Wt 210 lb (95.255 kg)  BMI 28.47 kg/m2  SpO2 100% Physical Exam  Constitutional: He is oriented to person, place, and time. He appears well-developed and well-nourished. No distress.  HENT:  Head: Normocephalic and atraumatic.  Eyes: EOM are normal.  Cardiovascular: Normal rate, regular rhythm and intact distal pulses.   Pulmonary/Chest: Effort normal and breath sounds normal. He exhibits no tenderness.  Abdominal: Soft. Bowel sounds  are normal. There is no tenderness.  Musculoskeletal: Normal range of motion. He exhibits no edema.  Neurological: He is alert and oriented to person, place, and time.  Skin: Skin is warm and dry.  Psychiatric: He has a normal mood and affect.    ED Course  Procedures (including critical care time) Labs Review Labs Reviewed  BASIC METABOLIC PANEL - Abnormal; Notable for the following:    Glucose, Bld 107 (*)    All other components within normal limits  CBC  TROPONIN I  MAGNESIUM    Imaging Review No results found. I have personally reviewed and evaluated these images and lab results as part of my  medical decision-making.   EKG Interpretation   Date/Time:  Sunday February 18 2015 11:37:34 EDT Ventricular Rate:  54 PR Interval:  150 QRS Duration: 88 QT Interval:  418 QTC Calculation: 396 R Axis:   62 Text Interpretation:  Sinus bradycardia Nonspecific T wave abnormality  Abnormal ECG No significant change since last tracing Confirmed by FLOYD  MD, DANIEL ([id number]) on 02/18/2015 11:41:09 AM      MDM   Final diagnoses:  Heart palpitations  Symptomatic PVCs   Patient presenting with irregular heart beat for 3 weeks. Denies any associated chest pain. Episodes noticed on monitor consistent with intermittent PVCs. EKG with normal sinus rhythm. Labs unremarkable; no elevated troponin, Mg and other electrolytes normal.   Discussed diagnosis with patient. No concerning red flags on exam and work-up. Will discharge home in stable condition. Patient given return precautions. Should follow-up with cardiologist or PCP for further evaluation. Possible need for Holter monitor.    Luiz Blare, DO 02/18/2015, 2:12 PM PGY-2, [place], DO 02/18/15 [place], DO 02/18/15 [phone]

## 2015-02-27 ENCOUNTER — Ambulatory Visit: Payer: Commercial Indemnity | Admitting: Family Medicine

## 2015-04-02 ENCOUNTER — Telehealth: Payer: Self-pay | Admitting: Family Medicine

## 2015-04-02 NOTE — Telephone Encounter (Signed)
Please review, what directions?-aa

## 2015-04-02 NOTE — Telephone Encounter (Signed)
Needs refill on his viagra 25mg    Uses [facility] [address].  Thanks Con Memos

## 2015-04-03 MED ORDER — SILDENAFIL CITRATE 20 MG PO TABS: ORAL_TABLET | ORAL | Status: DC

## 2015-04-03 NOTE — Telephone Encounter (Signed)
Ok to rf for 1 year--generic is 20mg  dose--2-5 tablets daily prn,#100.  thx

## 2015-04-03 NOTE — Telephone Encounter (Signed)
RX sent in-aa 

## 2015-04-13 ENCOUNTER — Encounter: Payer: Self-pay | Admitting: Family Medicine

## 2015-04-13 ENCOUNTER — Ambulatory Visit (INDEPENDENT_AMBULATORY_CARE_PROVIDER_SITE_OTHER): Payer: Managed Care, Other (non HMO) | Admitting: Family Medicine

## 2015-04-13 VITALS — BP 124/74 | HR 60 | Temp 98.3°F | Resp 16 | Ht 71.0 in | Wt 217.0 lb

## 2015-04-13 DIAGNOSIS — J3489 Other specified disorders of nose and nasal sinuses: Secondary | ICD-10-CM

## 2015-04-13 DIAGNOSIS — J309 Allergic rhinitis, unspecified: Secondary | ICD-10-CM | POA: Diagnosis not present

## 2015-04-13 MED ORDER — FLUTICASONE PROPIONATE 50 MCG/ACT NA SUSP: NASAL | Status: DC

## 2015-04-13 MED ORDER — CEFDINIR 300 MG PO CAPS: 300.0000 mg | ORAL_CAPSULE | Freq: Two times a day (BID) | ORAL | Status: DC

## 2015-04-13 NOTE — Progress Notes (Signed)
Subjective:     Patient ID: Jack Weaver, male   DOB: [birth year], 59 y.o.   MRN: [id number]  HPI  Chief Complaint  Patient presents with  . Sinus Problem    Patient comes in office today with complaints of sinus pain and pressue for the past week and half. Patient reports that he was seen in September and was treated and symptoms cleared. Patient has been taking otc Claritin and Sudafed  States he developed frontal sinus pressure with initial PND over the last two weeks. Reports occasional milky to clear drainage. States he occasionally smokes and is exposed to dust on job sites.   Review of Systems  Constitutional: Negative for fever and chills.  Respiratory: Negative for cough.        Objective:   Physical Exam  Constitutional: He appears well-developed and well-nourished. No distress.  Ears: T.M's intact without inflammation Sinuses: mild frontal sinus tenderness Throat: no tonsillar enlargement or exudate Neck: no cervical adenopathy Lungs: clear     Assessment:    1. Allergic rhinitis, unspecified allergic rhinitis type - fluticasone (FLONASE) 50 MCG/ACT nasal spray; USE 2 SPRAYS EACH NOSTRIL EVERY DAY  Dispense: 16 g; Refill: 3  2. Sinus pressure - cefdinir (OMNICEF) 300 MG capsule; Take 1 capsule (300 mg total) by mouth 2 (two) times daily.  Dispense: 20 capsule; Refill: 0    Plan:    Start steroid nasal spray for a week. If no improvement or purulent nasal drainage to start abx.

## 2015-04-13 NOTE — Patient Instructions (Signed)
Try steroid nasal spray for a week; If not improving or see yellow or green drainage start the antibiotic.

## 2015-04-18 IMAGING — CR DG KNEE COMPLETE 4+V*L*
1 series · 4 of 4 positions shown · non-contrast
Comparison: 05/21/2009

CLINICAL DATA: Fall from deck with twisted knee and pain, initial
encounter

EXAM:
LEFT KNEE - COMPLETE 4+ VIEW

[Series 1: dxr knee lt comp with obliques · 0.14mm/px · 4 of 4 slices shown]
[im 1/4]
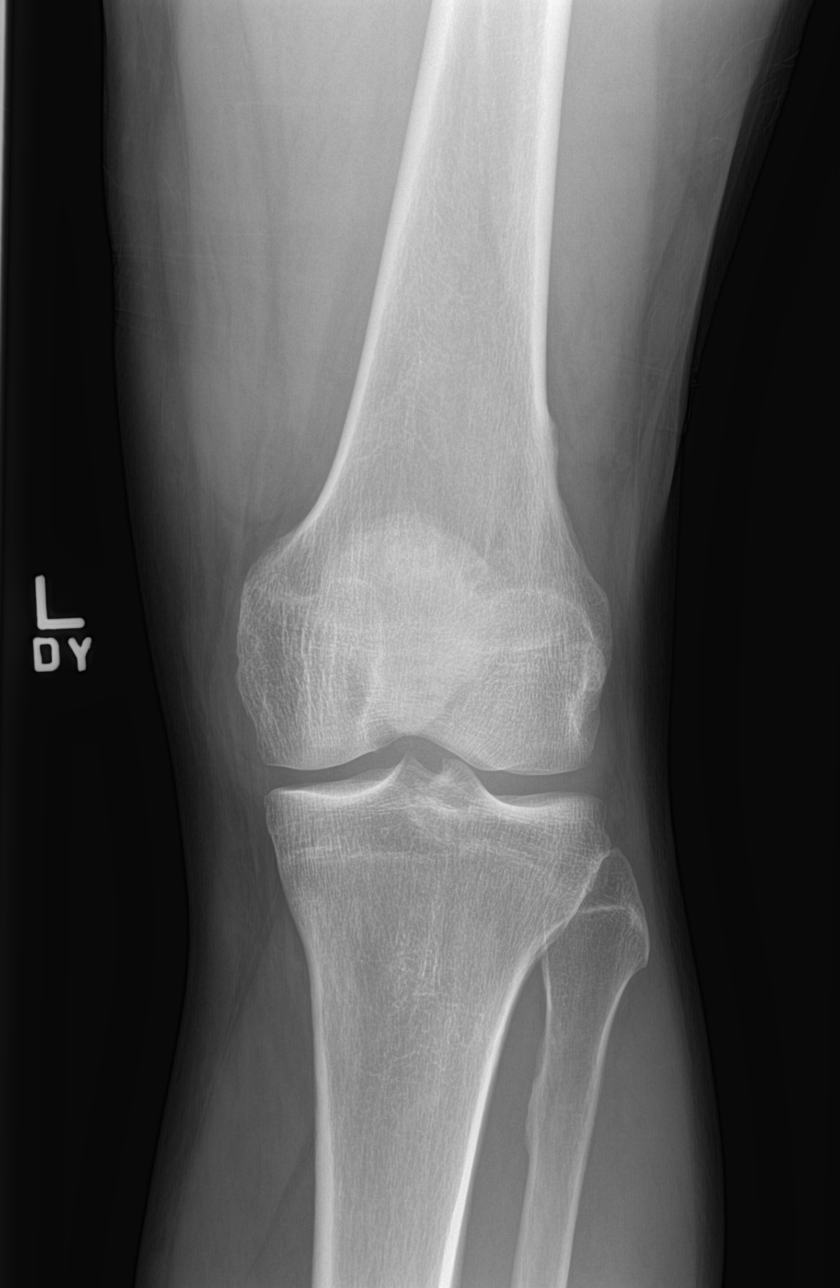
[im 2/4]
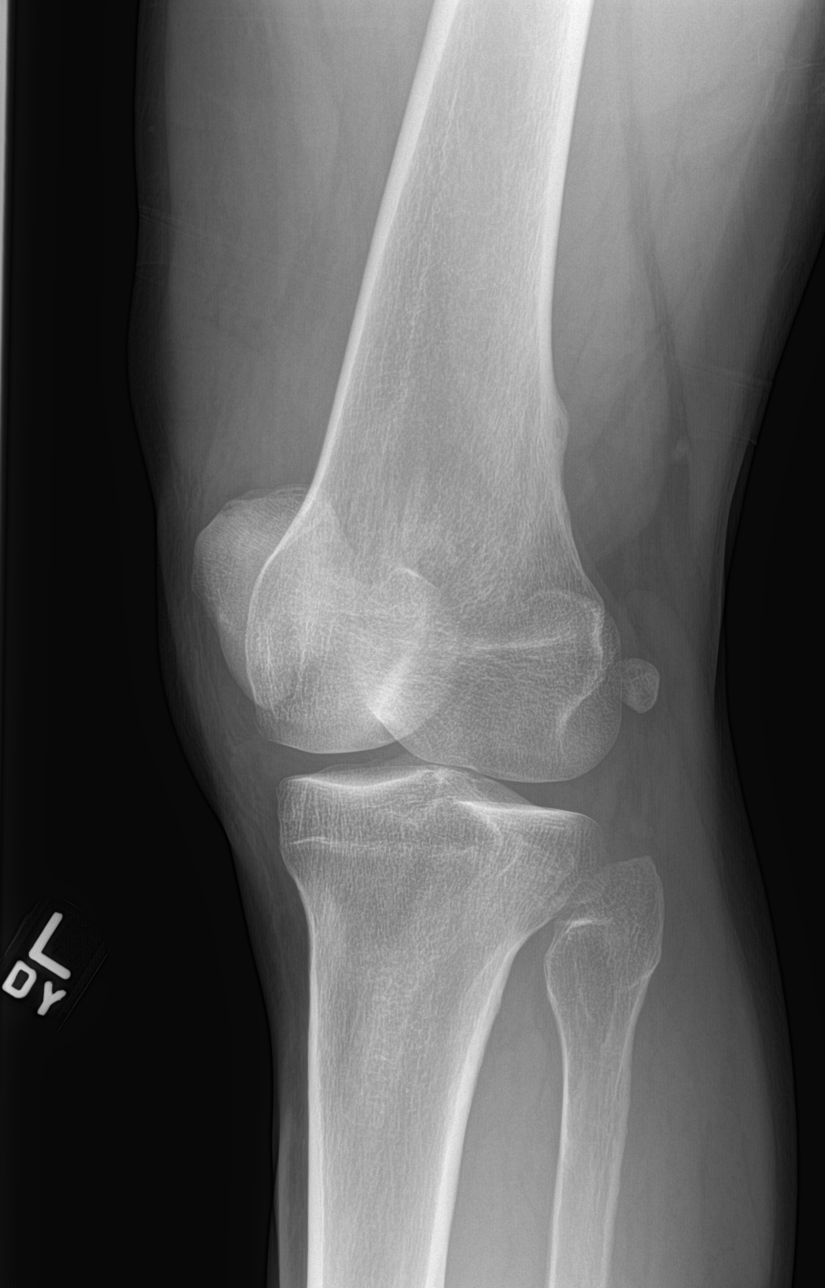
[im 3/4]
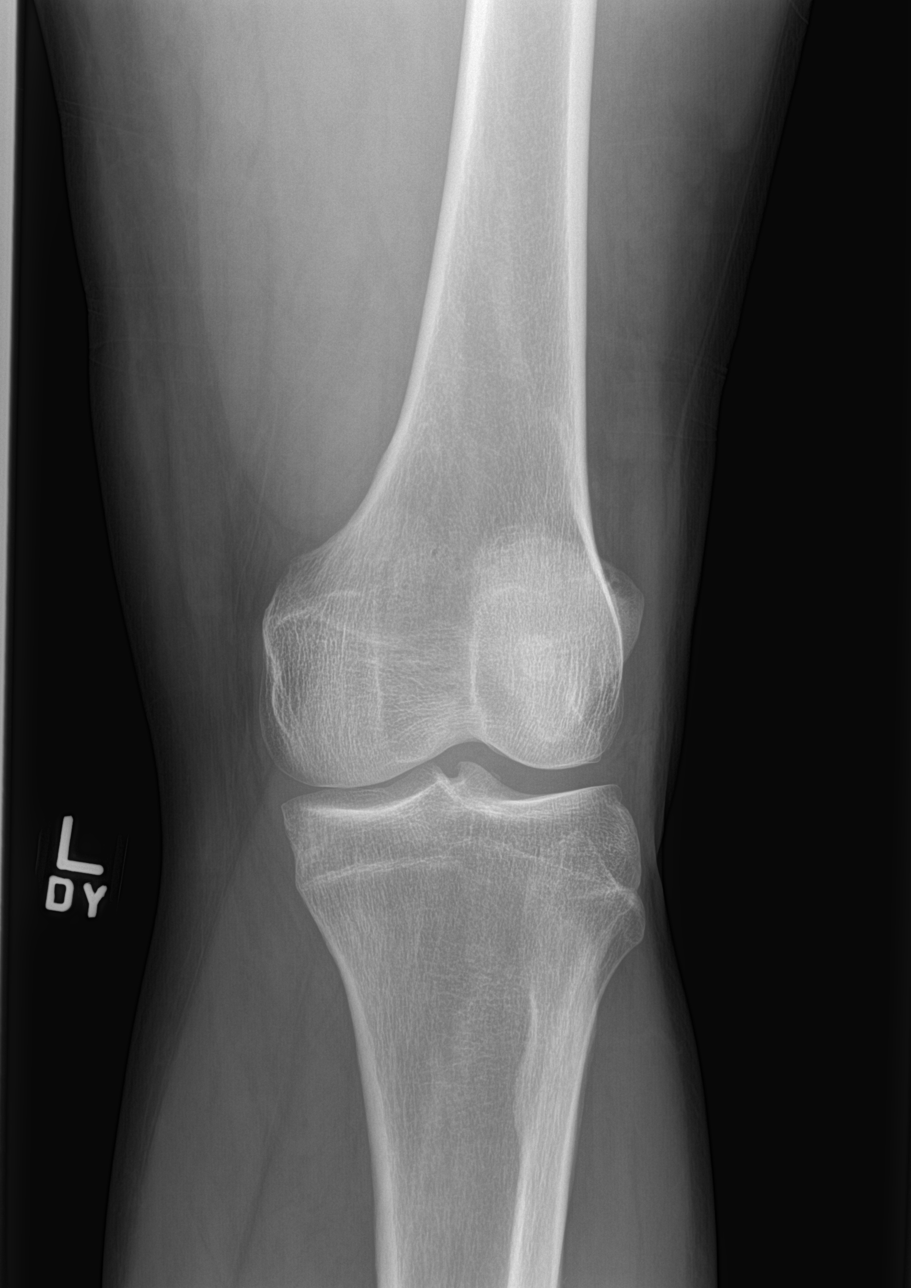
[im 4/4]
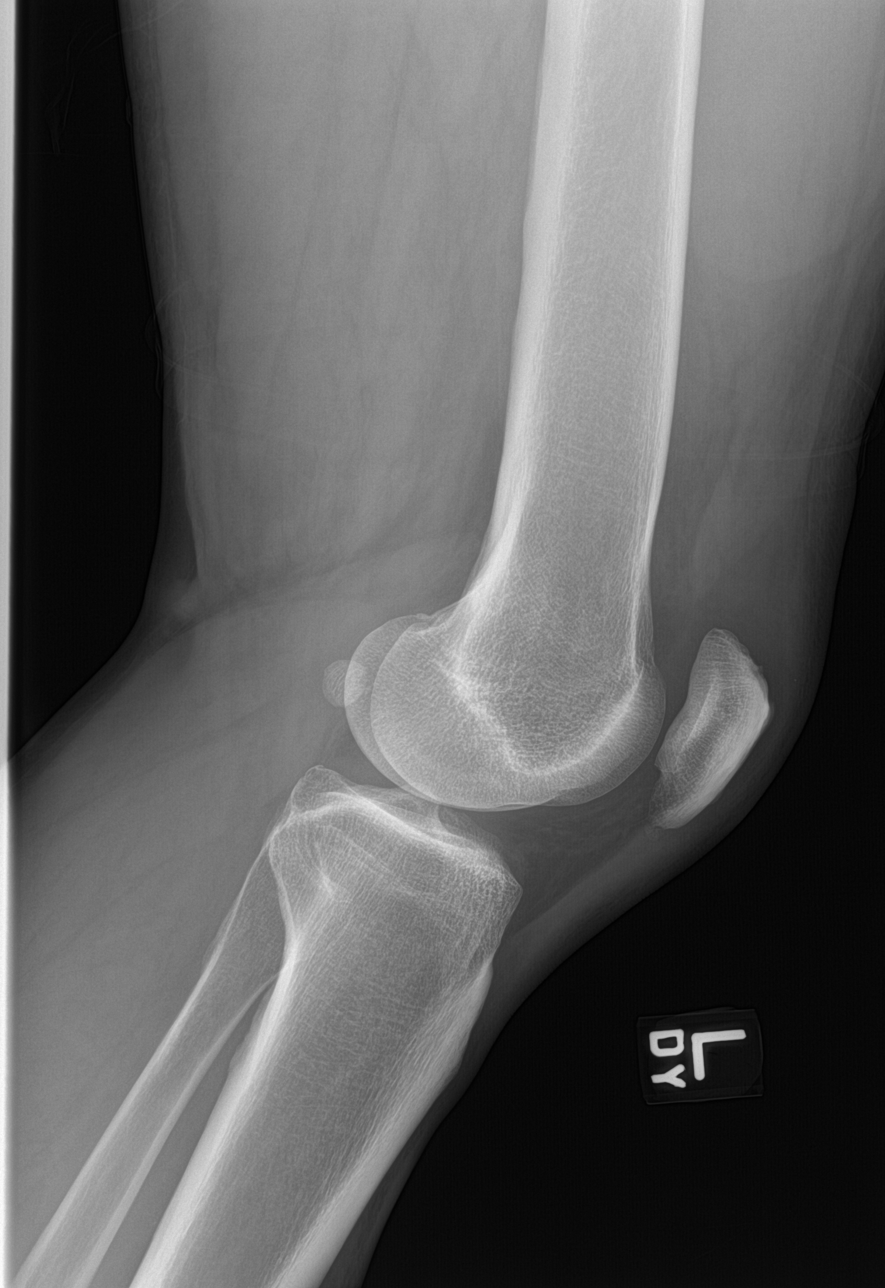

[4 of 4 positions shown; findings below may reference images not displayed]

FINDINGS: There is no evidence of fracture, dislocation, or joint effusion.
There is no evidence of arthropathy or other focal bone abnormality.
Soft tissues are unremarkable.
IMPRESSION: No acute abnormality noted.

## 2015-05-11 ENCOUNTER — Other Ambulatory Visit: Payer: Self-pay | Admitting: Family Medicine

## 2015-05-13 ENCOUNTER — Other Ambulatory Visit: Payer: Self-pay | Admitting: Family Medicine

## 2015-05-16 ENCOUNTER — Telehealth: Payer: Self-pay | Admitting: Family Medicine

## 2015-05-16 NOTE — Telephone Encounter (Signed)
Patient is requesting a refill on this medicine  cefdinir (OMNICEF) 300 MG capsule [id number]  [place]

## 2015-05-17 ENCOUNTER — Ambulatory Visit (INDEPENDENT_AMBULATORY_CARE_PROVIDER_SITE_OTHER): Payer: Managed Care, Other (non HMO) | Admitting: Family Medicine

## 2015-05-17 ENCOUNTER — Encounter: Payer: Self-pay | Admitting: Family Medicine

## 2015-05-17 VITALS — BP 110/70 | HR 59 | Temp 98.5°F | Resp 16 | Wt 216.4 lb

## 2015-05-17 DIAGNOSIS — J0101 Acute recurrent maxillary sinusitis: Secondary | ICD-10-CM | POA: Diagnosis not present

## 2015-05-17 MED ORDER — LEVOFLOXACIN 500 MG PO TABS: 500.0000 mg | ORAL_TABLET | Freq: Every day | ORAL | Status: DC

## 2015-05-17 MED ORDER — PREDNISONE 10 MG PO TABS: ORAL_TABLET | ORAL | Status: DC

## 2015-05-17 NOTE — Telephone Encounter (Signed)
Patient being seen in office today. KW

## 2015-05-17 NOTE — Telephone Encounter (Signed)
Office visit as scheduled

## 2015-05-17 NOTE — Patient Instructions (Signed)
If not better we can do a CT scan of your sinuses.

## 2015-05-17 NOTE — Progress Notes (Signed)
Subjective:     Patient ID: Jack Weaver, male   DOB: [birth year], 59 y.o.   MRN: [id number]  HPI  Chief Complaint  Patient presents with  . Facial Pain    Paitent comes in office today for follow up, last office visit was 11/18. Patient was diagnosed with Allergic Rhinitis and sinus pressure, patient was prescribed Omnicef 300mg  and Flonase 32mcg. Patient states that he is still using flonase but once he finished antibiotic symptoms returned and today complains of sinus pressure on the left side of his face.   States he also had seen ENT prior to his previous visit (note not in EMR) and treatment did not help. States nasal drainage is not clear. Reports he has been using Flonase daily.   Review of Systems  Constitutional: Negative for fever and chills.       Objective:   Physical Exam  Constitutional: He appears well-developed and well-nourished. No distress.  Ears: T.M's intact without inflammation Sinuses: mild left maxillary sinus tenderness Throat: no tonsillar enlargement or exudate Neck: no cervical adenopathy Lungs: clear     Assessment:    1. Acute recurrent maxillary sinusitis - levofloxacin (LEVAQUIN) 500 MG tablet; Take 1 tablet (500 mg total) by mouth daily.  Dispense: 10 tablet; Refill: 0 - predniSONE (DELTASONE) 10 MG tablet; Taper daily as follows: 6 pills, 5, 4, 3, 2, 1  Dispense: 21 tablet; Refill: 0    Plan:    Consider CT scan if not improved and/or return to ENT.

## 2015-05-28 ENCOUNTER — Emergency Department
Admission: EM | Admit: 2015-05-28 | Discharge: 2015-05-29 | Disposition: A | Discharge status: Home / Self Care | Payer: Managed Care, Other (non HMO) | Attending: Emergency Medicine | Admitting: Emergency Medicine

## 2015-05-28 DIAGNOSIS — K625 Hemorrhage of anus and rectum: Secondary | ICD-10-CM | POA: Diagnosis present

## 2015-05-28 DIAGNOSIS — F17200 Nicotine dependence, unspecified, uncomplicated: Secondary | ICD-10-CM | POA: Diagnosis not present

## 2015-05-28 DIAGNOSIS — Z79899 Other long term (current) drug therapy: Secondary | ICD-10-CM | POA: Diagnosis not present

## 2015-05-28 DIAGNOSIS — I10 Essential (primary) hypertension: Secondary | ICD-10-CM | POA: Insufficient documentation

## 2015-05-28 DIAGNOSIS — K644 Residual hemorrhoidal skin tags: Secondary | ICD-10-CM | POA: Diagnosis not present

## 2015-05-28 DIAGNOSIS — Z791 Long term (current) use of non-steroidal anti-inflammatories (NSAID): Secondary | ICD-10-CM | POA: Diagnosis not present

## 2015-05-28 LAB — COMPREHENSIVE METABOLIC PANEL
ALT: 18 U/L (ref 17–63)
AST: 23 U/L (ref 15–41)
Albumin: 4.4 g/dL (ref 3.5–5.0)
Alkaline Phosphatase: 94 U/L (ref 38–126)
Anion gap: 4 — ABNORMAL LOW (ref 5–15)
BUN: 22 mg/dL — ABNORMAL HIGH (ref 6–20)
CO2: 27 mmol/L (ref 22–32)
Calcium: 8.9 mg/dL (ref 8.9–10.3)
Chloride: 109 mmol/L (ref 101–111)
Creatinine, Ser: 1.21 mg/dL (ref 0.61–1.24)
GFR calc Af Amer: >60 mL/min (ref >60)
GFR calc non Af Amer: >60 mL/min (ref >60)
Glucose, Bld: 115 mg/dL — ABNORMAL HIGH (ref 65–99)
Potassium: 4 mmol/L (ref 3.5–5.1)
Sodium: 140 mmol/L (ref 135–145)
Total Bilirubin: 0.2 mg/dL — ABNORMAL LOW (ref 0.3–1.2)
Total Protein: 6.6 g/dL (ref 6.5–8.1)

## 2015-05-28 LAB — CBC WITH DIFFERENTIAL/PLATELET
Basophils Absolute: 0 10*3/uL (ref 0–0.1)
Basophils Relative: 1 %
Eosinophils Absolute: 0.1 10*3/uL (ref 0–0.7)
Eosinophils Relative: 3 %
HCT: 42.1 % (ref 40.0–52.0)
Hemoglobin: 14.1 g/dL (ref 13.0–18.0)
Lymphocytes Relative: 52 %
Lymphs Abs: 2.7 10*3/uL (ref 1.0–3.6)
MCH: 28.5 pg (ref 26.0–34.0)
MCHC: 33.5 g/dL (ref 32.0–36.0)
MCV: 85 fL (ref 80.0–100.0)
Monocytes Absolute: 0.4 10*3/uL (ref 0.2–1.0)
Monocytes Relative: 7 %
Neutro Abs: 1.9 10*3/uL (ref 1.4–6.5)
Neutrophils Relative %: 37 %
Platelets: 166 10*3/uL (ref 150–440)
RBC: 4.95 MIL/uL (ref 4.40–5.90)
RDW: 13.8 % (ref 11.5–14.5)
WBC: 5.2 10*3/uL (ref 3.8–10.6)

## 2015-05-28 MED ORDER — HYDROCORTISONE ACETATE 25 MG RE SUPP
25.0000 mg | Freq: Once | RECTAL | Status: DC
  Filled 2015-05-28: qty 1

## 2015-05-28 MED ORDER — HYDROCORTISONE ACETATE 25 MG RE SUPP: 25.0000 mg | Freq: Two times a day (BID) | RECTAL | Status: DC | PRN

## 2015-05-28 NOTE — ED Notes (Signed)
Pt states he had one episode of bloody stool, no hx of the same, no co pain.

## 2015-05-28 NOTE — Discharge Instructions (Signed)

## 2015-05-28 NOTE — ED Notes (Signed)
Patient states that when he had a BM tonight he noticed "a lot" of bright red blood in the toilet and blood on the toilet paper. Patient denies passing any clots, rectal pain and abdominal pain. Has only had one episode of this.

## 2015-05-28 NOTE — ED Provider Notes (Signed)
[facility] Emergency Department Provider Note  ____________________________________________  Time seen: 11:25 PM  I have reviewed the triage vital signs and the nursing notes.   HISTORY  Chief Complaint Rectal Bleeding      HPI Jack Weaver is a 60 y.o. male presents with painless rectal bleeding bright red blood noted in the toilet on the toilet paper following defecation tonight. Patient admits to previous episode of the same secondary to hemorrhoids for which she had a hemorrhoidectomy performed many years ago. Patient denies any vomiting no diarrhea no fever. Patient states that he recently did some heavy lifting helping a friend move.     Past Medical History  Diagnosis Date  . Hypertension     Patient Active Problem List   Diagnosis Date Noted  . ED (erectile dysfunction) of organic origin 11/18/2014  . BP (high blood pressure) 11/18/2014  . Neuropathy ([place]) 11/18/2014  . Allergic rhinitis 11/14/2014  . Arthropathia 11/26/2009  . History of tobacco use 07/31/2008  . Acid reflux 07/26/2008  . Arthritis, degenerative 07/26/2008  . Apnea, sleep 06/30/2008    Past Surgical History  Procedure Laterality Date  . Hemorroidectomy  1930  . Appendectomy      At age 65    Current Outpatient Rx  Name  Route  Sig  Dispense  Refill  . diltiazem (DILACOR XR) 120 MG 24 hr capsule   Oral   Take 120 mg by mouth daily.         . fluticasone (FLONASE) 50 MCG/ACT nasal spray   Each Nare   Place 2 sprays into both nostrils daily as needed for rhinitis.         Marland Kitchen ibuprofen (ADVIL,MOTRIN) 200 MG tablet   Oral   Take 400-600 mg by mouth every 6 (six) hours as needed for headache or mild pain.         Marland Kitchen lisinopril (PRINIVIL,ZESTRIL) 40 MG tablet   Oral   Take 40 mg by mouth daily.          . meloxicam (MOBIC) 15 MG tablet   Oral   Take 15 mg by mouth daily.         . Multiple Vitamin (MULTIVITAMIN WITH MINERALS) TABS tablet    Oral   Take 1 tablet by mouth daily.         . sildenafil (REVATIO) 20 MG tablet   Oral   Take 40-100 mg by mouth as needed (for erectile dysfunction).          Marland Kitchen levofloxacin (LEVAQUIN) 500 MG tablet   Oral   Take 1 tablet (500 mg total) by mouth daily. Patient not taking: Reported on 05/28/2015   10 tablet   0   . predniSONE (DELTASONE) 10 MG tablet      Taper daily as follows: 6 pills, 5, 4, 3, 2, 1 Patient not taking: Reported on 05/28/2015   21 tablet   0     Allergies No known drug allergies  Family History  Problem Relation Age of Onset  . Heart attack Mother   . Stroke Father   . Asthma Father   . Cancer Brother   . Diabetes Brother     Social History Social History  Substance Use Topics  . Smoking status: Current Some Day Smoker    Types: Cigars  . Smokeless tobacco: Never Used     Comment: quit in 06/2013. Pt recently started 1 cigarette daily. Now smokes once a week.  Marland Kitchen  Alcohol Use: 0.0 oz/week    0 Standard drinks or equivalent per week     Comment: Wine 5x week    Review of Systems  Constitutional: Negative for fever. Eyes: Negative for visual changes. ENT: Negative for sore throat. Cardiovascular: Negative for chest pain. Respiratory: Negative for shortness of breath. Gastrointestinal: Negative for abdominal pain, vomiting and diarrhea. Positive for rectal bleeding Genitourinary: Negative for dysuria. Musculoskeletal: Negative for back pain. Skin: Negative for rash. Neurological: Negative for headaches, focal weakness or numbness.   10-point ROS otherwise negative.  ____________________________________________   PHYSICAL EXAM:  VITAL SIGNS: ED Triage Vitals  Enc Vitals Group     BP 05/28/15 1912 183/102 mmHg     Pulse Rate 05/28/15 1912 66     Resp 05/28/15 1912 18     Temp 05/28/15 1912 98.3 F (36.8 C)     Temp Source 05/28/15 1912 Oral     SpO2 05/28/15 1912 99 %     Weight 05/28/15 1912 207 lb (93.895 kg)     Height  05/28/15 1912 5\' 11"  (1.803 m)     Head Cir --      Peak Flow --      Pain Score 05/28/15 2300 0     Pain Loc --      Pain Edu? --      Excl. in [place]? --      Constitutional: Alert and oriented. Well appearing and in no distress. Eyes: Conjunctivae are normal. PERRL. Normal extraocular movements. ENT   Head: Normocephalic and atraumatic.   Nose: No congestion/rhinnorhea.   Mouth/Throat: Mucous membranes are moist.   Neck: No stridor. Hematological/Lymphatic/Immunilogical: No cervical lymphadenopathy. Cardiovascular: Normal rate, regular rhythm. Normal and symmetric distal pulses are present in all extremities. No murmurs, rubs, or gallops. Respiratory: Normal respiratory effort without tachypnea nor retractions. Breath sounds are clear and equal bilaterally. No wheezes/rales/rhonchi. Gastrointestinal: Soft and nontender. No distention. There is no CVA tenderness. External hemorrhoid noted with evidence of recent bleeding. Genitourinary: deferred Musculoskeletal: Nontender with normal range of motion in all extremities. No joint effusions.  No lower extremity tenderness nor edema. Neurologic:  Normal speech and language. No gross focal neurologic deficits are appreciated. Speech is normal.  Skin:  Skin is warm, dry and intact. No rash noted. Psychiatric: Mood and affect are normal. Speech and behavior are normal. Patient exhibits appropriate insight and judgment.  ____________________________________________    LABS (pertinent positives/negatives)  Labs Reviewed  COMPREHENSIVE METABOLIC PANEL - Abnormal; Notable for the following:    Glucose, Bld 115 (*)    BUN 22 (*)    Total Bilirubin 0.2 (*)    Anion gap 4 (*)    All other components within normal limits  CBC WITH DIFFERENTIAL/PLATELET         INITIAL IMPRESSION / ASSESSMENT AND PLAN / ED COURSE  Pertinent labs & imaging results that were available during my care of the patient were reviewed by me and  considered in my medical decision making (see chart for details).  History of physical exam consistent with bleeding secondary to hemorrhoids. Patient had no abdominal pain on exam. Patient received Anusol will be prescribed the same for home  ____________________________________________   FINAL CLINICAL IMPRESSION(S) / ED DIAGNOSES  Final diagnoses:  External hemorrhoids      Gregor Hams, MD 05/28/15 2352

## 2015-05-31 DIAGNOSIS — M7070 Other bursitis of hip, unspecified hip: Secondary | ICD-10-CM | POA: Insufficient documentation

## 2015-06-06 ENCOUNTER — Other Ambulatory Visit: Payer: Self-pay | Admitting: Family Medicine

## 2015-06-18 ENCOUNTER — Ambulatory Visit (INDEPENDENT_AMBULATORY_CARE_PROVIDER_SITE_OTHER): Payer: Managed Care, Other (non HMO) | Admitting: Family Medicine

## 2015-06-18 VITALS — BP 158/72 | HR 60 | Temp 98.4°F | Resp 16 | Wt 213.0 lb

## 2015-06-18 DIAGNOSIS — J329 Chronic sinusitis, unspecified: Secondary | ICD-10-CM | POA: Diagnosis not present

## 2015-06-18 MED ORDER — PREDNISONE 20 MG PO TABS: 20.0000 mg | ORAL_TABLET | Freq: Every day | ORAL | Status: DC

## 2015-06-18 MED ORDER — AMOXICILLIN-POT CLAVULANATE 875-125 MG PO TABS: 1.0000 | ORAL_TABLET | Freq: Two times a day (BID) | ORAL | Status: DC

## 2015-06-18 NOTE — Progress Notes (Signed)
Patient ID: Jack Weaver, male   DOB: [birth year], 60 y.o.   MRN: [id number]   MONTAE KRUPA  MRN: [id number] DOB: 1955-09-18  Subjective:  HPI   1. Chronic sinusitis, unspecified location The patient is a 60 year old male who presents for evaluation of continued sinusitis.  The patient has been having symptoms for 4 moths and has been seen by several providers.  He has been on antibiotics about 3 times and 2 times on the prednisone.  He states that while on the medicine he gets better but once he stops it he gets sick again.  Patient Active Problem List   Diagnosis Date Noted  . ED (erectile dysfunction) of organic origin 11/18/2014  . BP (high blood pressure) 11/18/2014  . Neuropathy ([place]) 11/18/2014  . Allergic rhinitis 11/14/2014  . Arthropathia 11/26/2009  . History of tobacco use 07/31/2008  . Acid reflux 07/26/2008  . Arthritis, degenerative 07/26/2008  . Apnea, sleep 06/30/2008    Past Medical History  Diagnosis Date  . Hypertension     Social History   Social History  . Marital Status: Married    Spouse Name: N/A  . Number of Children: N/A  . Years of Education: N/A   Occupational History  . Not on file.   Social History Main Topics  . Smoking status: Current Some Day Smoker    Types: Cigars  . Smokeless tobacco: Never Used     Comment: quit in 06/2013. Pt recently started 1 cigarette daily. Now smokes once a week.  . Alcohol Use: 0.0 oz/week    0 Standard drinks or equivalent per week     Comment: Wine 5x week  . Drug Use: No  . Sexual Activity: Not on file   Other Topics Concern  . Not on file   Social History Narrative    Outpatient Prescriptions Prior to Visit  Medication Sig Dispense Refill  . diltiazem (DILACOR XR) 120 MG 24 hr capsule Take 120 mg by mouth daily.    . fluticasone (FLONASE) 50 MCG/ACT nasal spray Place 2 sprays into both nostrils daily as needed for rhinitis.    . hydrocortisone (ANUSOL-HC) 25 MG suppository Place 1 suppository  (25 mg total) rectally 2 (two) times daily as needed for hemorrhoids or itching. 12 suppository 0  . ibuprofen (ADVIL,MOTRIN) 200 MG tablet Take 400-600 mg by mouth every 6 (six) hours as needed for headache or mild pain.    Marland Kitchen lisinopril (PRINIVIL,ZESTRIL) 40 MG tablet Take 40 mg by mouth daily.     . meloxicam (MOBIC) 15 MG tablet Take 15 mg by mouth daily.    . Multiple Vitamin (MULTIVITAMIN WITH MINERALS) TABS tablet Take 1 tablet by mouth daily.    Marland Kitchen levofloxacin (LEVAQUIN) 500 MG tablet Take 1 tablet (500 mg total) by mouth daily. (Patient not taking: Reported on 05/28/2015) 10 tablet 0  . predniSONE (DELTASONE) 10 MG tablet Taper daily as follows: 6 pills, 5, 4, 3, 2, 1 (Patient not taking: Reported on 05/28/2015) 21 tablet 0  . sildenafil (REVATIO) 20 MG tablet Take 40-100 mg by mouth as needed (for erectile dysfunction).     . sildenafil (REVATIO) 20 MG tablet TAKE 2 TO 5 TABLETS DAILY AS NEEDED 100 tablet 0   No facility-administered medications prior to visit.    No Known Allergies  Review of Systems  Constitutional: Negative for fever, chills, weight loss, malaise/fatigue and diaphoresis.  HENT: Positive for congestion. Negative for ear discharge, ear pain, nosebleeds, sore  throat and tinnitus. Hearing loss: Muffled sound like in a tunnel.   Eyes: Positive for blurred vision. Negative for double vision, photophobia, pain, discharge and redness.  Respiratory: Negative for cough, hemoptysis, sputum production, shortness of breath, wheezing and stridor.   Cardiovascular: Positive for palpitations (chronic and unchanged). Negative for chest pain, orthopnea, claudication and leg swelling.  Neurological: Negative.  Negative for weakness.  Psychiatric/Behavioral: Negative.    Objective:  BP 158/72 mmHg  Pulse 60  Temp(Src) 98.4 F (36.9 C) (Oral)  Resp 16  Wt 213 lb (96.616 kg)  Physical Exam  Constitutional: He is oriented to person, place, and time and well-developed,  well-nourished, and in no distress.  HENT:  Head: Normocephalic and atraumatic.  Right Ear: External ear normal.  Left Ear: External ear normal.  Nose: Nose normal.  Mouth/Throat: Oropharynx is clear and moist.  Frontal and ethmoid tenderness Biltateral TM fullness  Eyes: Conjunctivae are normal. Pupils are equal, round, and reactive to light.  Neck: Normal range of motion.  Cardiovascular: Normal rate, regular rhythm and normal heart sounds.   Pulmonary/Chest: Effort normal and breath sounds normal.  Abdominal: Soft.  Neurological: He is alert and oriented to person, place, and time.  Skin: Skin is warm and dry.  Psychiatric: Mood, memory, affect and judgment normal.    Assessment and Plan :  Chronic sinusitis, unspecified location  Treat and refer back to ENT due to chronicity of this situation.  Miguel Aschoff MD [facility] 06/18/2015 3:39 PM

## 2015-06-20 DIAGNOSIS — H6120 Impacted cerumen, unspecified ear: Secondary | ICD-10-CM | POA: Insufficient documentation

## 2015-07-02 ENCOUNTER — Other Ambulatory Visit: Payer: Self-pay | Admitting: Family Medicine

## 2015-07-02 DIAGNOSIS — J329 Chronic sinusitis, unspecified: Secondary | ICD-10-CM

## 2015-07-02 MED ORDER — PREDNISONE 10 MG (21) PO TBPK: 10.0000 mg | ORAL_TABLET | Freq: Every day | ORAL | Status: DC

## 2015-07-02 NOTE — Telephone Encounter (Signed)
Patient states that his symptoms have improved but not resolved. Patient was afraid to get off the medication and symptoms get worse, he told Anderson [place] that you told him to call for refill, please review-aa

## 2015-07-02 NOTE — Telephone Encounter (Signed)
Pt informed. Med sent to pharmacy.  

## 2015-07-02 NOTE — Telephone Encounter (Signed)
I am ok to rf Prednisone taper as long as he has ENT appt.

## 2015-07-02 NOTE — Telephone Encounter (Signed)
Patient would like a refill of predniSONE (DELTASONE) 20 MG tablet sent [facility].

## 2015-07-11 ENCOUNTER — Other Ambulatory Visit: Payer: Self-pay | Admitting: Family Medicine

## 2015-07-12 ENCOUNTER — Other Ambulatory Visit: Payer: Self-pay | Admitting: Emergency Medicine

## 2015-07-12 MED ORDER — PREDNISONE 10 MG PO TABS: 20.0000 mg | ORAL_TABLET | Freq: Every day | ORAL | Status: DC

## 2015-08-11 ENCOUNTER — Other Ambulatory Visit: Payer: Self-pay | Admitting: Family Medicine

## 2015-11-02 ENCOUNTER — Encounter: Payer: Self-pay | Admitting: Family Medicine

## 2015-11-02 ENCOUNTER — Ambulatory Visit (INDEPENDENT_AMBULATORY_CARE_PROVIDER_SITE_OTHER): Payer: Managed Care, Other (non HMO) | Admitting: Family Medicine

## 2015-11-02 VITALS — BP 132/80 | HR 60 | Temp 97.6°F | Resp 16 | Wt 218.0 lb

## 2015-11-02 DIAGNOSIS — R1011 Right upper quadrant pain: Secondary | ICD-10-CM

## 2015-11-02 NOTE — Patient Instructions (Signed)
Eat low fat meals to minimize gallbladder pain.

## 2015-11-02 NOTE — Progress Notes (Signed)
Subjective:     Patient ID: Jack Weaver, male   DOB: [birth year], 60 y.o.   MRN: [id number]  HPI  Chief Complaint  Patient presents with  . Bloated    Patient comes in office today with concerns of RUQ abdominal discomfort for the past 2 months and bloating intermittent. Patient states that " it fels like a runners cramp." Patient denies symptoms of gas, constipation or diarrhea.   States it seems to get worse with meals and has become persistent. No associated with fever, nausea or constipation. "My wife says it sounds like a gallbladder." Normal colonoscopy in 2006.   Review of Systems     Objective:   Physical Exam  Constitutional: He appears well-developed and well-nourished. No distress.  Abdominal: Soft. Bowel sounds are normal. There is tenderness (moderate in right UQ/+ Murphy's sign). There is no guarding.       Assessment:    1. Right upper quadrant pain - US Abdomen Limited RUQ; Future    Plan:    Avoid fatty meals pending further evaluation. Patient defers pain medication.

## 2015-11-07 ENCOUNTER — Ambulatory Visit
Admission: RE | Admit: 2015-11-07 | Discharge: 2015-11-07 | Disposition: A | Discharge status: Home / Self Care | Payer: Managed Care, Other (non HMO) | Source: Ambulatory Visit | Attending: Family Medicine | Admitting: Family Medicine

## 2015-11-07 DIAGNOSIS — K7689 Other specified diseases of liver: Secondary | ICD-10-CM | POA: Diagnosis not present

## 2015-11-07 DIAGNOSIS — R1011 Right upper quadrant pain: Secondary | ICD-10-CM | POA: Diagnosis present

## 2015-11-19 ENCOUNTER — Other Ambulatory Visit: Payer: Self-pay | Admitting: Family Medicine

## 2016-03-26 ENCOUNTER — Encounter: Payer: Self-pay | Admitting: Family Medicine

## 2016-03-26 ENCOUNTER — Ambulatory Visit (INDEPENDENT_AMBULATORY_CARE_PROVIDER_SITE_OTHER): Payer: Managed Care, Other (non HMO) | Admitting: Family Medicine

## 2016-03-26 VITALS — BP 124/82 | HR 72 | Temp 97.7°F | Resp 16 | Wt 221.0 lb

## 2016-03-26 DIAGNOSIS — S96911A Strain of unspecified muscle and tendon at ankle and foot level, right foot, initial encounter: Secondary | ICD-10-CM

## 2016-03-26 DIAGNOSIS — R202 Paresthesia of skin: Secondary | ICD-10-CM

## 2016-03-26 NOTE — Progress Notes (Signed)
Subjective:     Patient ID: Jack Weaver, male   DOB: 04-Sep-1955, 60 y.o.   MRN: [id number]  HPI  Chief Complaint  Patient presents with  . Foot Pain    Patient comes in office today with complaints of foot pain in both feet. Patient states "It feels like Im walking on marbles," patient complains of right foot pain for 2-3 weeks which he describes as stabbing pain on top of foot. Patient reports pain in the left foot for the past 3 years that he describes as numbness.   Reports that he jogs for an hour daily. States his running shoes are getting old and acknowledges he need a new pair. Job involves driving more than walking. States he takes meloxicam daily for a left knee problem. Has added two ibuprofen daily with little improvement over the last 4 days. Review of Systems     Objective:   Physical Exam  Constitutional: He appears well-developed and well-nourished. No distress.  Cardiovascular:  Pulses:      Dorsalis pedis pulses are 2+ on the right side, and 2+ on the left side.       Posterior tibial pulses are 2+ on the right side, and 2+ on the left side.  Musculoskeletal:  Right foot: DF/PF 5/5. Tender over dorsum of foot/4th MTP Area.Bunion present. Ankle ligaments stable Left foot: DF/PF 5/5. No tenderness elicited; bunion present        Assessment:    1. Paresthesia of left foot - Ambulatory referral to Podiatry  2. Right foot strain, initial encounter - Ambulatory referral to Podiatry    Plan:    Discussed cutting back on jogging for a week and adding Tylenol ES as needed. Get new running shoes.

## 2016-03-26 NOTE — Patient Instructions (Signed)
Add Tylenol extra strength two pills 3 x day as needed for right foot pain. Get new running shoes and consider cutting back on jogging for at least a week.

## 2016-04-07 ENCOUNTER — Encounter: Payer: Self-pay | Admitting: Podiatry

## 2016-04-07 ENCOUNTER — Ambulatory Visit (INDEPENDENT_AMBULATORY_CARE_PROVIDER_SITE_OTHER): Payer: 59

## 2016-04-07 ENCOUNTER — Ambulatory Visit (INDEPENDENT_AMBULATORY_CARE_PROVIDER_SITE_OTHER): Payer: 59 | Admitting: Podiatry

## 2016-04-07 VITALS — BP 157/101 | HR 58 | Temp 97.5°F | Resp 16 | Ht 71.0 in | Wt 207.0 lb

## 2016-04-07 DIAGNOSIS — M2042 Other hammer toe(s) (acquired), left foot: Secondary | ICD-10-CM

## 2016-04-07 DIAGNOSIS — M79673 Pain in unspecified foot: Secondary | ICD-10-CM | POA: Diagnosis not present

## 2016-04-07 DIAGNOSIS — M19079 Primary osteoarthritis, unspecified ankle and foot: Secondary | ICD-10-CM | POA: Diagnosis not present

## 2016-04-07 DIAGNOSIS — M7751 Other enthesopathy of right foot: Secondary | ICD-10-CM

## 2016-04-07 DIAGNOSIS — M779 Enthesopathy, unspecified: Secondary | ICD-10-CM

## 2016-04-07 DIAGNOSIS — R52 Pain, unspecified: Secondary | ICD-10-CM

## 2016-04-07 DIAGNOSIS — G629 Polyneuropathy, unspecified: Secondary | ICD-10-CM | POA: Diagnosis not present

## 2016-04-07 DIAGNOSIS — M2041 Other hammer toe(s) (acquired), right foot: Secondary | ICD-10-CM

## 2016-04-07 DIAGNOSIS — M778 Other enthesopathies, not elsewhere classified: Secondary | ICD-10-CM

## 2016-04-07 NOTE — Progress Notes (Signed)
   Subjective:    Patient ID: Jack Weaver, male    DOB: [birth year], 60 y.o.   MRN: [id number]  HPI : He presents today with a 2 year duration of an uncomfortable feeling beneath his forefoot left. He states that isn't present and feels like a water in his shoes. he is also complaining of a more recent within the past 2-3 months pain to the dorsal lateral aspect of the right foot. ibuprofen and meloxicam did not provide any relief. the dorsal aspect of the right foot hurts worse with running or jogging.    Review of Systems  All other systems reviewed and are negative.      Objective:   Physical Exam: Vital signs are stable he is alert and oriented 3 pulses are strongly palpable. Neurologic sensorium is intact. Deep tendon reflexes are intact. Muscle strength is 5 over 5 dorsiflexion plantar flexors and inverters everters all of his musculature is intact. Orthopedic evaluation of his result joints distal to the angle full range of motion without crepitus a tibial deformity and hammertoe deformities are noted bilaterally symmetrical. He has no pain on range of motion or palpation of the left forefoot. He does however have pain on direct palpation of the base of the fourth metatarsal at the cuneiforms cuboid articulation. Radiographs 3 views bilateral foot today only confirms hallux valgus deformity and hammertoe deformities bilaterally. Mild osteoarthritic changes at the base of the fourth metatarsal. Cutaneous evaluation shows supple well-hydrated cutis.        Assessment & Plan:  Capsulitis osteoarthritis base of the fourth metatarsal right foot. Hallux abductovalgus deformity with hammertoes bilateral. Cannot rule out a radiculopathy or some type of early neuropathy to his forefoot left.  Plan: I injected the right dorsal aspect of the foot today at the base of the fourth metatarsal, the area of maximal tenderness. Follow-up with me in 6 weeks if necessary.

## 2016-04-22 ENCOUNTER — Ambulatory Visit: Payer: 59 | Admitting: Podiatry

## 2016-05-21 ENCOUNTER — Encounter (INDEPENDENT_AMBULATORY_CARE_PROVIDER_SITE_OTHER): Payer: 59 | Admitting: Podiatry

## 2016-05-21 NOTE — Progress Notes (Signed)
This encounter was created in error - please disregard.

## 2016-06-27 ENCOUNTER — Other Ambulatory Visit: Payer: Self-pay | Admitting: Family Medicine

## 2016-06-30 ENCOUNTER — Ambulatory Visit (INDEPENDENT_AMBULATORY_CARE_PROVIDER_SITE_OTHER): Payer: Managed Care, Other (non HMO) | Admitting: Family Medicine

## 2016-06-30 ENCOUNTER — Encounter: Payer: Self-pay | Admitting: Family Medicine

## 2016-06-30 VITALS — BP 162/84 | HR 60 | Temp 98.6°F | Resp 14 | Wt 219.0 lb

## 2016-06-30 DIAGNOSIS — Z125 Encounter for screening for malignant neoplasm of prostate: Secondary | ICD-10-CM

## 2016-06-30 DIAGNOSIS — I10 Essential (primary) hypertension: Secondary | ICD-10-CM

## 2016-06-30 DIAGNOSIS — G629 Polyneuropathy, unspecified: Secondary | ICD-10-CM | POA: Diagnosis not present

## 2016-06-30 DIAGNOSIS — M5442 Lumbago with sciatica, left side: Secondary | ICD-10-CM

## 2016-06-30 DIAGNOSIS — Z8639 Personal history of other endocrine, nutritional and metabolic disease: Secondary | ICD-10-CM

## 2016-06-30 DIAGNOSIS — M25562 Pain in left knee: Secondary | ICD-10-CM

## 2016-06-30 DIAGNOSIS — Z6830 Body mass index (BMI) 30.0-30.9, adult: Secondary | ICD-10-CM

## 2016-06-30 LAB — POCT GLYCOSYLATED HEMOGLOBIN (HGB A1C): Hemoglobin A1C: 5.7

## 2016-06-30 MED ORDER — DILTIAZEM HCL ER COATED BEADS 240 MG PO CP24: 240.0000 mg | ORAL_CAPSULE | Freq: Every day | ORAL | 3 refills | Status: DC

## 2016-06-30 NOTE — Progress Notes (Signed)
Jack Weaver  MRN: [id number] DOB: [birth year]  Subjective:  HPI  Patient is here for follow up. Patient checks his b/p sometimes and readings are usually around 160s/80s. No cardiac symptoms. BP Readings from Last 3 Encounters:  06/30/16 (!) 162/84  04/07/16 (!) 157/101  03/26/16 124/82   Has chronic pains, mainly in the left knee. Right now dealing with back and hip pain and has been started on Prednisone 12 day taper. He takes Ibuprofen and meloxicam as needed for the pain. He is also having left foot numbness, he saw Carmon Ginsberg in November 2017 for this and then was referred to Dr Milinda Pointer. He did have A1C checked in 2013 and it was 6.6, this is re checked today and result is 5.7 . Labs" 05/28/15 MetC and CBC, 07/06/14 Lipids, TSH Patient Active Problem List   Diagnosis Date Noted  . ED (erectile dysfunction) of organic origin 11/18/2014  . BP (high blood pressure) 11/18/2014  . Neuropathy ([place]) 11/18/2014  . Allergic rhinitis 11/14/2014  . Arthropathia 11/26/2009  . History of tobacco use 07/31/2008  . Acid reflux 07/26/2008  . Arthritis, degenerative 07/26/2008  . Apnea, sleep 06/30/2008    Past Medical History:  Diagnosis Date  . Hypertension     Social History   Social History  . Marital status: Married    Spouse name: N/A  . Number of children: N/A  . Years of education: N/A   Occupational History  . Not on file.   Social History Main Topics  . Smoking status: Current Some Day Smoker    Types: Cigars  . Smokeless tobacco: Never Used     Comment: quit in 06/2013. Pt recently started 1 cigarette daily. Now smokes once a week.  . Alcohol use 0.0 oz/week     Comment: Wine 5x week  . Drug use: No  . Sexual activity: Not on file   Other Topics Concern  . Not on file   Social History Narrative  . No narrative on file    Outpatient Encounter Prescriptions as of 06/30/2016  Medication Sig  . diltiazem (CARDIZEM CD) 120 MG 24 hr capsule TAKE 1 CAPSULE BY  MOUTH DAILY  . ibuprofen (ADVIL,MOTRIN) 200 MG tablet Take 400-600 mg by mouth every 6 (six) hours as needed for headache or mild pain.  Marland Kitchen lisinopril (PRINIVIL,ZESTRIL) 40 MG tablet TAKE 1 TABLET BY MOUTH DAILY  . meloxicam (MOBIC) 15 MG tablet Take 15 mg by mouth daily.  . Multiple Vitamin (MULTIVITAMIN WITH MINERALS) TABS tablet Take 1 tablet by mouth daily.  . predniSONE (STERAPRED UNI-PAK 48 TAB) 5 MG (48) TBPK tablet    No facility-administered encounter medications on file as of 06/30/2016.     No Known Allergies  Review of Systems  Constitutional: Negative.   Respiratory: Negative.   Cardiovascular: Negative.   Musculoskeletal: Positive for back pain, joint pain (knee and hip pain) and neck pain.  Neurological:       Left foot numbness    Objective:  BP (!) 162/84   Pulse 60   Temp 98.6 F (37 C)   Resp 14   Wt 219 lb (99.3 kg)   BMI 30.54 kg/m   Physical Exam  Constitutional: He is oriented to person, place, and time and well-developed, well-nourished, and in no distress.  HENT:  Head: Normocephalic and atraumatic.  Eyes: Conjunctivae are normal. Pupils are equal, round, and reactive to light.  Neck: Normal range of motion. Neck supple.  Cardiovascular: Normal rate,  regular rhythm, normal heart sounds and intact distal pulses.   No murmur heard. Pulmonary/Chest: Breath sounds normal. No respiratory distress. He has no wheezes.  Neurological: He is alert and oriented to person, place, and time.  Psychiatric: Mood, memory, affect and judgment normal.    Assessment and Plan :  1. Essential hypertension Elevated. Increase Diltiazem to 240 mg today and re check in 1 month. - CBC w/Diff/Platelet - Comprehensive metabolic panel - TSH  2. Arthralgia of left knee Stable.  3. History of hyperglycemia Much better. - POCT HgB A1C  4. Acute bilateral low back pain with left-sided sciatica Doing better on Prednisone.  5. BMI 30.0-30.9,adult Check labs. - Lipid  Panel With LDL/HDL Ratio - TSH  6. Prostate cancer screening - PSA 7.Chronic ED 8.Neuropathy HPI, Exam and A&P transcribed under direction and in the presence of Miguel Aschoff, MD. I have done the exam and reviewed the chart and it is accurate to the best of my knowledge. Development worker, community has been used and  any errors in dictation or transcription are unintentional. Miguel Aschoff M.D. [facility]

## 2016-07-03 ENCOUNTER — Telehealth: Payer: Self-pay

## 2016-07-03 LAB — VITAMIN B12: Vitamin B-12: 772 pg/mL (ref 232–1245)

## 2016-07-03 LAB — COMPREHENSIVE METABOLIC PANEL
ALT: 20 IU/L (ref 0–44)
AST: 20 IU/L (ref 0–40)
Albumin/Globulin Ratio: 1.8 (ref 1.2–2.2)
Albumin: 4.2 g/dL (ref 3.6–4.8)
Alkaline Phosphatase: 85 IU/L (ref 39–117)
BUN/Creatinine Ratio: 15 (ref 10–24)
BUN: 17 mg/dL (ref 8–27)
Bilirubin Total: 0.6 mg/dL (ref 0.0–1.2)
CO2: 24 mmol/L (ref 18–29)
Calcium: 9.2 mg/dL (ref 8.6–10.2)
Chloride: 103 mmol/L (ref 96–106)
Creatinine, Ser: 1.17 mg/dL (ref 0.76–1.27)
GFR calc Af Amer: 77 mL/min/{1.73_m2} (ref >59)
GFR calc non Af Amer: 66 mL/min/{1.73_m2} (ref >59)
Globulin, Total: 2.3 g/dL (ref 1.5–4.5)
Glucose: 103 mg/dL — ABNORMAL HIGH (ref 65–99)
Potassium: 4.2 mmol/L (ref 3.5–5.2)
Sodium: 142 mmol/L (ref 134–144)
Total Protein: 6.5 g/dL (ref 6.0–8.5)

## 2016-07-03 LAB — CBC WITH DIFFERENTIAL/PLATELET
Basophils Absolute: 0 10*3/uL (ref 0.0–0.2)
Basos: 0 %
EOS (ABSOLUTE): 0 10*3/uL (ref 0.0–0.4)
Eos: 0 %
Hematocrit: 44 % (ref 37.5–51.0)
Hemoglobin: 14.8 g/dL (ref 13.0–17.7)
Immature Grans (Abs): 0.1 10*3/uL (ref 0.0–0.1)
Immature Granulocytes: 1 %
Lymphocytes Absolute: 2.4 10*3/uL (ref 0.7–3.1)
Lymphs: 33 %
MCH: 28.1 pg (ref 26.6–33.0)
MCHC: 33.6 g/dL (ref 31.5–35.7)
MCV: 84 fL (ref 79–97)
Monocytes Absolute: 0.6 10*3/uL (ref 0.1–0.9)
Monocytes: 8 %
Neutrophils Absolute: 4.2 10*3/uL (ref 1.4–7.0)
Neutrophils: 58 %
Platelets: 213 10*3/uL (ref 150–379)
RBC: 5.26 x10E6/uL (ref 4.14–5.80)
RDW: 13.8 % (ref 12.3–15.4)
WBC: 7.3 10*3/uL (ref 3.4–10.8)

## 2016-07-03 LAB — LIPID PANEL WITH LDL/HDL RATIO
Cholesterol, Total: 167 mg/dL (ref 100–199)
HDL: 63 mg/dL (ref >39)
LDL Calculated: 86 mg/dL (ref 0–99)
LDl/HDL Ratio: 1.4 ratio units (ref 0.0–3.6)
Triglycerides: 90 mg/dL (ref 0–149)
VLDL Cholesterol Cal: 18 mg/dL (ref 5–40)

## 2016-07-03 LAB — TSH: TSH: 2.31 u[IU]/mL (ref 0.450–4.500)

## 2016-07-03 LAB — PSA: Prostate Specific Ag, Serum: 3.8 ng/mL (ref 0.0–4.0)

## 2016-07-03 NOTE — Telephone Encounter (Signed)
Advised pt of lab results. Pt verbally acknowledges understanding. Aprile Dickenson Drozdowski, CMA   

## 2016-07-03 NOTE — Telephone Encounter (Signed)
lmtcb Emily Drozdowski, CMA  

## 2016-07-03 NOTE — Telephone Encounter (Signed)
-----   Message from [name], MD sent at 07/03/2016  9:21 AM EST ----- Labs okay. About 40%  increase in PSA over the past year. Discuss with urology when he sees them. We can follow-up repeat his PSA in 6 months.

## 2016-07-08 ENCOUNTER — Other Ambulatory Visit: Payer: Self-pay | Admitting: Family Medicine

## 2016-07-30 ENCOUNTER — Ambulatory Visit (INDEPENDENT_AMBULATORY_CARE_PROVIDER_SITE_OTHER): Payer: Managed Care, Other (non HMO) | Admitting: Family Medicine

## 2016-07-30 ENCOUNTER — Encounter: Payer: Self-pay | Admitting: Family Medicine

## 2016-07-30 VITALS — BP 138/82 | HR 62 | Temp 97.7°F | Resp 14 | Wt 221.0 lb

## 2016-07-30 DIAGNOSIS — M5442 Lumbago with sciatica, left side: Secondary | ICD-10-CM

## 2016-07-30 DIAGNOSIS — I10 Essential (primary) hypertension: Secondary | ICD-10-CM

## 2016-07-30 DIAGNOSIS — G8929 Other chronic pain: Secondary | ICD-10-CM | POA: Diagnosis not present

## 2016-07-30 DIAGNOSIS — R202 Paresthesia of skin: Secondary | ICD-10-CM

## 2016-07-30 NOTE — Progress Notes (Signed)
Jack Weaver  MRN: [id number] DOB: 10/11/1955  Subjective:  HPI  Patient is here to follow up on b/p. LOV was 06/30/16 and at that time Diltiazem was increased. Routine labs were done at that time also and were stable. Patient has been taking b/p and the last reading was around 138/90. Tolerating medication well. BP Readings from Last 3 Encounters:  07/30/16 138/82  06/30/16 (!) 162/84  04/07/16 (!) 157/101   Patient Active Problem List   Diagnosis Date Noted  . ED (erectile dysfunction) of organic origin 11/18/2014  . BP (high blood pressure) 11/18/2014  . Neuropathy ([place]) 11/18/2014  . Allergic rhinitis 11/14/2014  . Arthropathia 11/26/2009  . History of tobacco use 07/31/2008  . Acid reflux 07/26/2008  . Arthritis, degenerative 07/26/2008  . Apnea, sleep 06/30/2008    Past Medical History:  Diagnosis Date  . Hypertension     Social History   Social History  . Marital status: Married    Spouse name: N/A  . Number of children: N/A  . Years of education: N/A   Occupational History  . Not on file.   Social History Main Topics  . Smoking status: Current Some Day Smoker    Types: Cigars  . Smokeless tobacco: Never Used     Comment: quit in 06/2013. Pt recently started 1 cigarette daily. Now smokes once a week.  . Alcohol use 0.0 oz/week     Comment: Wine 5x week  . Drug use: No  . Sexual activity: Not on file   Other Topics Concern  . Not on file   Social History Narrative  . No narrative on file    Outpatient Encounter Prescriptions as of 07/30/2016  Medication Sig  . diltiazem (CARDIZEM CD) 240 MG 24 hr capsule Take 1 capsule (240 mg total) by mouth daily.  Marland Kitchen ibuprofen (ADVIL,MOTRIN) 200 MG tablet Take 400-600 mg by mouth every 6 (six) hours as needed for headache or mild pain.  Marland Kitchen lisinopril (PRINIVIL,ZESTRIL) 40 MG tablet TAKE 1 TABLET BY MOUTH DAILY  . meloxicam (MOBIC) 15 MG tablet Take 15 mg by mouth daily.  . Multiple Vitamin (MULTIVITAMIN WITH  MINERALS) TABS tablet Take 1 tablet by mouth daily.  . [DISCONTINUED] predniSONE (STERAPRED UNI-PAK 48 TAB) 5 MG (48) TBPK tablet    No facility-administered encounter medications on file as of 07/30/2016.     No Known Allergies  Review of Systems  Constitutional: Negative.   Respiratory: Negative.   Cardiovascular: Negative.   Musculoskeletal: Positive for back pain.  Neurological:       Numbness in the left foot is improving.    Objective:  BP 138/82 (BP Location: Right Arm, Cuff Size: Large)   Pulse 62   Temp 97.7 F (36.5 C)   Resp 14   Wt 221 lb (100.2 kg)   BMI 30.82 kg/m   Physical Exam  Constitutional: He is oriented to person, place, and time and well-developed, well-nourished, and in no distress.  HENT:  Head: Normocephalic and atraumatic.  Eyes: Conjunctivae are normal. Pupils are equal, round, and reactive to light.  Neck: Normal range of motion. Neck supple.  Cardiovascular: Normal rate, regular rhythm, normal heart sounds and intact distal pulses.   No murmur heard. Pulmonary/Chest: Effort normal and breath sounds normal. No respiratory distress. He has no wheezes.  Musculoskeletal: He exhibits no edema or tenderness.  Neurological: He is alert and oriented to person, place, and time.    Assessment and Plan :  1. Essential  hypertension Improved. Will follow for now. Recheck BP is 138/82. 2. Chronic bilateral low back pain with left-sided sciatica Discussed getting physical therapy referral in the future when patient decides. Follow.  3. Paresthesia of left foot Improving per patient.  HPI, Exam and A&P transcribed under direction and in the presence of Miguel Aschoff, MD.

## 2016-09-06 ENCOUNTER — Other Ambulatory Visit: Payer: Self-pay | Admitting: Family Medicine

## 2016-09-06 DIAGNOSIS — J309 Allergic rhinitis, unspecified: Secondary | ICD-10-CM

## 2016-12-17 ENCOUNTER — Ambulatory Visit: Payer: 59 | Admitting: Podiatry

## 2017-02-17 ENCOUNTER — Other Ambulatory Visit: Payer: Self-pay | Admitting: Family Medicine

## 2017-02-17 NOTE — Telephone Encounter (Signed)
Please advise 

## 2017-02-17 NOTE — Telephone Encounter (Signed)
Patient walked in wanting refill on Amoxicillin for "sinus infection".  No fever, no sore throat, just says nose is runny with yellow/green mucous.  [facility] [address] [address]    I told patient that it was doubtful he would get this without an appt. But pt wanted me to send message back anyway.

## 2017-02-18 MED ORDER — AMOXICILLIN 500 MG PO CAPS: 500.0000 mg | ORAL_CAPSULE | Freq: Three times a day (TID) | ORAL | 0 refills | Status: DC

## 2017-02-18 NOTE — Telephone Encounter (Signed)
[facility] Left patient a voicemail advising him that RX has been sent to pharmacy.

## 2017-02-18 NOTE — Telephone Encounter (Signed)
Amoxil 500mg TID for 1 week 

## 2017-04-19 ENCOUNTER — Other Ambulatory Visit: Payer: Self-pay | Admitting: Family Medicine

## 2017-04-19 DIAGNOSIS — J309 Allergic rhinitis, unspecified: Secondary | ICD-10-CM

## 2017-04-20 NOTE — Telephone Encounter (Signed)
See refill request.

## 2017-04-26 DIAGNOSIS — R1011 Right upper quadrant pain: Secondary | ICD-10-CM | POA: Diagnosis not present

## 2017-04-26 DIAGNOSIS — M898X1 Other specified disorders of bone, shoulder: Secondary | ICD-10-CM | POA: Diagnosis not present

## 2017-04-26 DIAGNOSIS — I10 Essential (primary) hypertension: Secondary | ICD-10-CM | POA: Diagnosis not present

## 2017-05-22 ENCOUNTER — Other Ambulatory Visit: Payer: Self-pay | Admitting: Family Medicine

## 2017-06-17 ENCOUNTER — Other Ambulatory Visit: Payer: Self-pay | Admitting: Family Medicine

## 2017-08-24 DIAGNOSIS — K090 Developmental odontogenic cysts: Secondary | ICD-10-CM | POA: Diagnosis not present

## 2017-08-25 ENCOUNTER — Other Ambulatory Visit: Payer: Self-pay | Admitting: Family Medicine

## 2017-08-31 ENCOUNTER — Other Ambulatory Visit: Payer: Self-pay

## 2017-08-31 ENCOUNTER — Emergency Department: Payer: No Typology Code available for payment source

## 2017-08-31 ENCOUNTER — Emergency Department
Admission: EM | Admit: 2017-08-31 | Discharge: 2017-08-31 | Disposition: A | Discharge status: Home / Self Care | Payer: No Typology Code available for payment source | Attending: Emergency Medicine | Admitting: Emergency Medicine

## 2017-08-31 ENCOUNTER — Encounter: Payer: Self-pay | Admitting: Emergency Medicine

## 2017-08-31 DIAGNOSIS — I10 Essential (primary) hypertension: Secondary | ICD-10-CM | POA: Insufficient documentation

## 2017-08-31 DIAGNOSIS — M25562 Pain in left knee: Secondary | ICD-10-CM | POA: Insufficient documentation

## 2017-08-31 DIAGNOSIS — F17290 Nicotine dependence, other tobacco product, uncomplicated: Secondary | ICD-10-CM | POA: Insufficient documentation

## 2017-08-31 DIAGNOSIS — Z79899 Other long term (current) drug therapy: Secondary | ICD-10-CM | POA: Diagnosis not present

## 2017-08-31 DIAGNOSIS — W19XXXA Unspecified fall, initial encounter: Secondary | ICD-10-CM | POA: Diagnosis not present

## 2017-08-31 MED ORDER — OXYCODONE-ACETAMINOPHEN 5-325 MG PO TABS: 1.0000 | ORAL_TABLET | Freq: Four times a day (QID) | ORAL | 0 refills | Status: DC | PRN

## 2017-08-31 MED ORDER — HYDROMORPHONE HCL 1 MG/ML IJ SOLN
1.0000 mg | Freq: Once | INTRAMUSCULAR | Status: AC
  Administered 2017-08-31: 1 mg via INTRAMUSCULAR
  Filled 2017-08-31: qty 1

## 2017-08-31 NOTE — ED Triage Notes (Addendum)
Presents vis ems s/p fall states he fell from the back of work truck  Landed on left knee  Pos deformity noted to knee

## 2017-08-31 NOTE — ED Provider Notes (Signed)
[facility] Emergency Department Provider Note  ____________________________________________  Time seen: Approximately 6:13 PM  I have reviewed the triage vital signs and the nursing notes.   HISTORY  Chief Complaint Fall and Knee Pain    HPI Jack Weaver is a 62 y.o. male presenting to the emergency department with 10 out of 10 aching left knee pain after patient reports that he fell from the back of a work truck and landed on left knee.  Patient has been unable to bear weight since the incident.  He denies hitting his head or losing consciousness.  He denies weakness or changes in sensation of the lower extremities.  No alleviating measures have been attempted.  Past Medical History:  Diagnosis Date  . Hypertension     Patient Active Problem List   Diagnosis Date Noted  . ED (erectile dysfunction) of organic origin 11/18/2014  . BP (high blood pressure) 11/18/2014  . Neuropathy 11/18/2014  . Allergic rhinitis 11/14/2014  . Arthropathia 11/26/2009  . History of tobacco use 07/31/2008  . Acid reflux 07/26/2008  . Arthritis, degenerative 07/26/2008  . Apnea, sleep 06/30/2008    Past Surgical History:  Procedure Laterality Date  . APPENDECTOMY     At age 61  . HEMORROIDECTOMY  1930    Prior to Admission medications   Medication Sig Start Date End Date Taking? Authorizing Provider  amoxicillin (AMOXIL) 500 MG capsule Take 1 capsule (500 mg total) by mouth 3 (three) times daily. 02/18/17   [name], MD  diltiazem [facility] CD) 240 MG 24 hr capsule TAKE 1 CAPSULE BY MOUTH EVERY DAY 08/25/17   [name], MD  fluticasone [facility]) 50 MCG/ACT nasal spray USE 2 SPRAYS EACH NOSTRIL EVERY DAY 04/20/17   [name], MD  ibuprofen (ADVIL,MOTRIN) 200 MG tablet Take 400-600 mg by mouth every 6 (six) hours as needed for headache or mild pain.    [provider]  lisinopril (PRINIVIL,ZESTRIL) 40 MG tablet TAKE 1  TABLET BY MOUTH DAILY 06/17/17   [name], MD  meloxicam (MOBIC) 15 MG tablet Take 15 mg by mouth daily.    [provider]  Multiple Vitamin (MULTIVITAMIN WITH MINERALS) TABS tablet Take 1 tablet by mouth daily.    [provider]  oxyCODONE-acetaminophen (PERCOCET/ROXICET) 5-325 MG tablet Take 1 tablet by mouth every 6 (six) hours as needed for up to 5 days for severe pain. 08/31/17 09/05/17  Lannie Fields, PA-C    Allergies Patient has no known allergies.  Family History  Problem Relation Age of Onset  . Heart attack Mother   . Stroke Father   . Asthma Father   . Cancer Brother   . Diabetes Brother     Social History Social History   Tobacco Use  . Smoking status: Current Some Day Smoker    Types: Cigars  . Smokeless tobacco: Never Used  . Tobacco comment: quit in 06/2013. Pt recently started 1 cigarette daily. Now smokes once a week.  Substance Use Topics  . Alcohol use: Yes    Alcohol/week: 0.0 oz    Comment: Wine 5x week  . Drug use: No     Review of Systems  Constitutional: No fever/chills Eyes: No visual changes. No discharge ENT: No upper respiratory complaints. Cardiovascular: no chest pain. Respiratory: no cough. No SOB. Gastrointestinal: No abdominal pain.  No nausea, no vomiting.  No diarrhea.  No constipation. Genitourinary: Negative for dysuria. No hematuria  Musculoskeletal: Patient has left knee pain. Skin: Negative for rash, abrasions, lacerations, ecchymosis. Neurological: Negative for headaches, focal weakness or numbness.   ____________________________________________   PHYSICAL EXAM:  VITAL SIGNS: ED Triage Vitals  Enc Vitals Group     BP 08/31/17 1653 (!) 163/97     Pulse Rate 08/31/17 1653 63     Resp 08/31/17 1653 20     Temp 08/31/17 1653 97.9 F (36.6 C)     Temp Source 08/31/17 1653 Oral     SpO2 08/31/17 1653 98 %     Weight 08/31/17 1654 210 lb (95.3 kg)     Height 08/31/17 1654 6' (1.829 m)      Head Circumference --      Peak Flow --      Pain Score 08/31/17 1653 9     Pain Loc --      Pain Edu? --      Excl. in [place]? --      Constitutional: Alert and oriented. Well appearing and in no acute distress. Eyes: Conjunctivae are normal. PERRL. EOMI. Head: Atraumatic. Cardiovascular: Normal rate, regular rhythm. Normal S1 and S2.  Good peripheral circulation. Respiratory: Normal respiratory effort without tachypnea or retractions. Lungs CTAB. Good air entry to the bases with no decreased or absent breath sounds. Musculoskeletal: Patient is unable to perform full range of motion at the left knee.  Patient has unnatural laxity of the patella and a palpable deficit is appreciated at the insertion of the quadriceps tendon.  Patient cannot perform a straight leg raise test, left.  Negative anterior and posterior drawer test, left.  No laxity with MCL or LCL testing, left.  Palpable dorsalis pedis pulse, left. Neurologic:  Normal speech and language. No gross focal neurologic deficits are appreciated.  Skin:  Skin is warm, dry and intact. No rash noted. Psychiatric: Mood and affect are normal. Speech and behavior are normal. Patient exhibits appropriate insight and judgement.   ____________________________________________   LABS (all labs ordered are listed, but only abnormal results are displayed)  Labs Reviewed - No data to display ____________________________________________  EKG   ____________________________________________  RADIOLOGY Unk Pinto, personally viewed and evaluated these images (plain radiographs) as part of my medical decision making, as well as reviewing the written report by the radiologist.  Dg Knee Complete 4 Views Left  Result Date: 08/31/2017 CLINICAL DATA:  Status post fall. EXAM: LEFT KNEE - COMPLETE 4+ VIEW COMPARISON:  None FINDINGS: Small suprapatellar joint effusion. Sharpening the tibial spines. Joint spaces appear well preserved. No fracture or  dislocation. IMPRESSION: 1. Mild osteoarthritis. 2. Small suprapatellar joint effusion. Electronically Signed   By: Kerby Moors M.D.   On: 08/31/2017 17:29    ____________________________________________    PROCEDURES  Procedure(s) performed:    Procedures    Medications  HYDROmorphone (DILAUDID) injection 1 mg (1 mg Intramuscular Given 08/31/17 1712)     ____________________________________________   INITIAL IMPRESSION / ASSESSMENT AND PLAN / ED COURSE  Pertinent labs & imaging results that were available during my care of the patient were reviewed by me and considered in my medical decision making (see chart for details).  Review of the [place] CSRS was performed in accordance of the [place] prior to dispensing any controlled drugs.     Assessment and plan Quadriceps tendon tear Knee pain Differential diagnosis included patellar fracture, ACL tear, meniscal tear and quadriceps tendon tear.  History and physical exam findings are consistent with quadriceps tendon tear/rupture.  Patient  was placed in a knee immobilizer in the emergency department and Dilaudid was given for pain.  Patient was discharged with Roxicet.  Patient was advised to follow-up with orthopedics as soon as possible.  All patient questions were answered.   ____________________________________________  FINAL CLINICAL IMPRESSION(S) / ED DIAGNOSES  Final diagnoses:  Acute pain of left knee      NEW MEDICATIONS STARTED DURING THIS VISIT:  ED Discharge Orders        Ordered    oxyCODONE-acetaminophen (PERCOCET/ROXICET) 5-325 MG tablet  Every 6 hours PRN     08/31/17 1800          This chart was dictated using voice recognition software/Dragon. Despite best efforts to proofread, errors can occur which can change the meaning. Any change was purely unintentional.    Lannie Fields, PA-C 08/31/17 1817    Darel Hong, MD 08/31/17 1840

## 2017-09-02 DIAGNOSIS — S76119A Strain of unspecified quadriceps muscle, fascia and tendon, initial encounter: Secondary | ICD-10-CM | POA: Insufficient documentation

## 2017-09-03 ENCOUNTER — Other Ambulatory Visit: Payer: Self-pay | Admitting: Specialist

## 2017-09-04 ENCOUNTER — Encounter: Payer: Self-pay | Admitting: Family Medicine

## 2017-09-04 ENCOUNTER — Ambulatory Visit: Payer: 59 | Admitting: Family Medicine

## 2017-09-04 ENCOUNTER — Telehealth: Payer: Self-pay | Admitting: Family Medicine

## 2017-09-04 VITALS — BP 122/88 | HR 64 | Temp 98.8°F | Resp 16 | Wt 216.4 lb

## 2017-09-04 DIAGNOSIS — Z01818 Encounter for other preprocedural examination: Secondary | ICD-10-CM

## 2017-09-04 DIAGNOSIS — S335XXA Sprain of ligaments of lumbar spine, initial encounter: Secondary | ICD-10-CM | POA: Insufficient documentation

## 2017-09-04 DIAGNOSIS — R319 Hematuria, unspecified: Secondary | ICD-10-CM | POA: Diagnosis not present

## 2017-09-04 LAB — POCT URINALYSIS DIPSTICK
Bilirubin, UA: NEGATIVE
Glucose, UA: NEGATIVE
Leukocytes, UA: NEGATIVE
Nitrite, UA: NEGATIVE
Protein, UA: NEGATIVE
Spec Grav, UA: >=1.03 — AB (ref 1.010–1.025)
Urobilinogen, UA: 0.2 E.U./dL
pH, UA: 5 (ref 5.0–8.0)

## 2017-09-04 LAB — URINALYSIS, MICROSCOPIC ONLY
Bacteria, U Microscopic: NEGATIVE
Crystals: NEGATIVE
Mucous: NEGATIVE
WBC: 0

## 2017-09-04 LAB — EKG 12-LEAD: EKG 12 lead: NORMAL

## 2017-09-04 NOTE — Patient Instructions (Signed)
Will call you with the lab results. We will fax in the pre-op labs and form Monday.

## 2017-09-04 NOTE — Telephone Encounter (Signed)
Received [facility] [employer] form to be fill out. Form placed in providers box. Thanks CC

## 2017-09-04 NOTE — Progress Notes (Signed)
Subjective:     Patient ID: Jack Weaver, male   DOB: [birth year], 62 y.o.   MRN: [id number] Chief Complaint  Patient presents with  . Pre-op Exam    Paitent comes in office today for pre-op visit, paitent is scheduled to have surgery on 09/09/17 by Dr. Earnestine Leys to repair quadricep left tendon.    HPI Comes into the clinic today using one crutch for ambulation and a knee immobilizer. Reports compliance with medication. States he smoke 2 cigars daily.  Review of Systems  Respiratory: Negative for cough and shortness of breath.   Cardiovascular: Negative for chest pain and palpitations.  Genitourinary: Negative for dysuria.       No nocturia       Objective:   Physical Exam  Constitutional: He appears well-developed and well-nourished. No distress.  HENT:  TM's intact without inflammation. No enlarged tonsils.  Eyes: Pupils are equal, round, and reactive to light. EOM are normal.  Cardiovascular: Normal rate and regular rhythm.  Pulmonary/Chest: Breath sounds normal. He has no wheezes.  Abdominal: Soft. There is no tenderness.  Musculoskeletal: He exhibits no edema (of lower extremities).       Assessment:    1. Pre-op examination - EKG 12-Lead - POCT urinalysis dipstick - Renal function panel - CBC with Differential/Platelet - Hemoglobin A1c  2. Hematuria, unspecified type: ? Due to heavy exertion with crutches. - PSA - Urinalysis, microscopic only    Plan:    Will complete pre-op form and fax labs/EKG when available next week.

## 2017-09-05 LAB — HEMOGLOBIN A1C
Est. average glucose Bld gHb Est-mCnc: 117 mg/dL
Hgb A1c MFr Bld: 5.7 % — ABNORMAL HIGH (ref 4.8–5.6)

## 2017-09-05 LAB — RENAL FUNCTION PANEL
Albumin: 4.3 g/dL (ref 3.6–4.8)
BUN/Creatinine Ratio: 15 (ref 10–24)
BUN: 17 mg/dL (ref 8–27)
CO2: 21 mmol/L (ref 20–29)
Calcium: 9.6 mg/dL (ref 8.6–10.2)
Chloride: 107 mmol/L — ABNORMAL HIGH (ref 96–106)
Creatinine, Ser: 1.13 mg/dL (ref 0.76–1.27)
GFR calc Af Amer: 79 mL/min/{1.73_m2} (ref >59)
GFR calc non Af Amer: 68 mL/min/{1.73_m2} (ref >59)
Glucose: 101 mg/dL — ABNORMAL HIGH (ref 65–99)
Phosphorus: 3.8 mg/dL (ref 2.5–4.5)
Potassium: 4.1 mmol/L (ref 3.5–5.2)
Sodium: 143 mmol/L (ref 134–144)

## 2017-09-05 LAB — CBC WITH DIFFERENTIAL/PLATELET
Basophils Absolute: 0 10*3/uL (ref 0.0–0.2)
Basos: 1 %
EOS (ABSOLUTE): 0 10*3/uL (ref 0.0–0.4)
Eos: 1 %
Hematocrit: 40.3 % (ref 37.5–51.0)
Hemoglobin: 13.8 g/dL (ref 13.0–17.7)
Immature Grans (Abs): 0 10*3/uL (ref 0.0–0.1)
Immature Granulocytes: 0 %
Lymphocytes Absolute: 1.9 10*3/uL (ref 0.7–3.1)
Lymphs: 43 %
MCH: 27.7 pg (ref 26.6–33.0)
MCHC: 34.2 g/dL (ref 31.5–35.7)
MCV: 81 fL (ref 79–97)
Monocytes Absolute: 0.3 10*3/uL (ref 0.1–0.9)
Monocytes: 7 %
Neutrophils Absolute: 2.1 10*3/uL (ref 1.4–7.0)
Neutrophils: 48 %
Platelets: 222 10*3/uL (ref 150–379)
RBC: 4.98 x10E6/uL (ref 4.14–5.80)
RDW: 13.8 % (ref 12.3–15.4)
WBC: 4.4 10*3/uL (ref 3.4–10.8)

## 2017-09-05 LAB — PSA: Prostate Specific Ag, Serum: 4.3 ng/mL — ABNORMAL HIGH (ref 0.0–4.0)

## 2017-09-08 ENCOUNTER — Encounter
Admission: RE | Admit: 2017-09-08 | Discharge: 2017-09-08 | Disposition: A | Discharge status: Home / Self Care | Payer: 59 | Source: Ambulatory Visit | Attending: Specialist | Admitting: Specialist

## 2017-09-08 ENCOUNTER — Other Ambulatory Visit: Payer: Self-pay

## 2017-09-08 HISTORY — DX: Unspecified osteoarthritis, unspecified site: M19.90

## 2017-09-08 MED ORDER — CEFAZOLIN SODIUM-DEXTROSE 2-4 GM/100ML-% IV SOLN
2.0000 g | INTRAVENOUS | Status: AC
  Administered 2017-09-09: 2 g via INTRAVENOUS
  Filled 2017-09-08: qty 100

## 2017-09-08 MED ORDER — CLINDAMYCIN PHOSPHATE 600 MG/50ML IV SOLN
600.0000 mg | INTRAVENOUS | Status: AC
  Administered 2017-09-09: 600 mg via INTRAVENOUS

## 2017-09-08 NOTE — Telephone Encounter (Signed)
Faxed again

## 2017-09-08 NOTE — Patient Instructions (Signed)
  Your procedure is scheduled on: Wednesday September 09, 2017, Arrive @ 6:00am Report to Same Day Surgery 2nd floor medical mall ([facility]-take elevator on left to 2nd floor.  Check in with surgery information desk.)  Remember: Instructions that are not followed completely may result in serious medical risk, up to and including death, or upon the discretion of your surgeon and anesthesiologist your surgery may need to be rescheduled.    _x___ 1. Do not eat food after midnight the night before your procedure. You may drink clear liquids up to 2 hours before you are scheduled to arrive at the hospital for your procedure.  Do not drink clear liquids within 2 hours of your scheduled arrival to the hospital.  Clear liquids include  --Water or Apple juice without pulp  --Clear carbohydrate beverage such as Gatorade  --Black Coffee or Clear Tea (No milk, no creamers, do not add anything to the coffee or tea   No gum chewing or hard candies.      __x__ 2. No Alcohol for 24 hours before or after surgery.   __x__3. No Smoking or e-cigarettes for 24 prior to surgery.  Do not use any chewable tobacco products for at least 6 hour prior to surgery   ____  4. Bring all medications with you on the day of surgery if instructed.    __x__ 5. Notify your doctor if there is any change in your medical condition     (cold, fever, infections).   __x__6. On the morning of surgery brush your teeth with toothpaste and water.  You may rinse your mouth with mouth wash if you wish.  Do not swallow any toothpaste or mouthwash.   Do not wear jewelry, make-up, hairpins, clips or nail polish.  Do not wear lotions, powders, deodorant, or perfumes.   Do not shave 48 hours prior to surgery.   Do not bring valuables to the hospital.    [facility] is not responsible for any belongings or valuables.               Contacts, dentures or bridgework may not be worn into surgery.  Leave your suitcase in the car.  After surgery it may be brought to your room.  For patients admitted to the hospital, discharge time is determined by your treatment team.  Please read over the following fact sheets that you were given:   [facility] Preparing for Surgery and or MRSA Information   _x___ Take anti-hypertensive listed below, cardiac, seizure, asthma, anti-reflux and psychiatric medicines. These include:  1. Diltiazem (Cardizem)  2. Fluticasone (Flonase)  3. Tramadol or Tylenol if needed for pain   _x___ Follow recommendations from Cardiologist, Pulmonologist or PCP regarding stopping Aspirin, Coumadin, Plavix ,Eliquis, Effient, or Pradaxa, and Pletal.  _x___Stop Anti-inflammatories such as Advil, Aleve, Ibuprofen, Motrin, Naproxen, Naprosyn, Goodies powders or aspirin products. OK to take Tylenol and Celebrex.   _x___ Stop supplements until after surgery.  But may continue Vitamin D, Vitamin B, and multivitamin.

## 2017-09-08 NOTE — Telephone Encounter (Signed)
Mikki Santee do you still have this form on your desk? I dont see a copy scanned in chart? KW

## 2017-09-08 NOTE — Telephone Encounter (Signed)
Sherry with [employer] called needing patients Medical Clearance form faxed back because his surgery is tomorrow.  Their fax is [phone] Or [phone]  Porfirio Oar.  Thanks  Con Memos

## 2017-09-08 NOTE — Telephone Encounter (Signed)
Carolyn-did you fax the pre-op form yesterday? See fax number provided below. thanks

## 2017-09-09 ENCOUNTER — Ambulatory Visit: Payer: No Typology Code available for payment source | Admitting: Anesthesiology

## 2017-09-09 ENCOUNTER — Encounter
Admission: RE | Disposition: A | Discharge status: Home / Self Care | Payer: Self-pay | Source: Ambulatory Visit | Attending: Specialist

## 2017-09-09 ENCOUNTER — Ambulatory Visit
Admission: RE | Admit: 2017-09-09 | Discharge: 2017-09-09 | Disposition: A | Discharge status: Home / Self Care | Payer: No Typology Code available for payment source | Source: Ambulatory Visit | Attending: Specialist | Admitting: Specialist

## 2017-09-09 ENCOUNTER — Encounter: Payer: Self-pay | Admitting: *Deleted

## 2017-09-09 DIAGNOSIS — G4730 Sleep apnea, unspecified: Secondary | ICD-10-CM | POA: Diagnosis not present

## 2017-09-09 DIAGNOSIS — F17200 Nicotine dependence, unspecified, uncomplicated: Secondary | ICD-10-CM | POA: Insufficient documentation

## 2017-09-09 DIAGNOSIS — S76112A Strain of left quadriceps muscle, fascia and tendon, initial encounter: Secondary | ICD-10-CM | POA: Insufficient documentation

## 2017-09-09 DIAGNOSIS — Y990 Civilian activity done for income or pay: Secondary | ICD-10-CM | POA: Insufficient documentation

## 2017-09-09 DIAGNOSIS — Z79899 Other long term (current) drug therapy: Secondary | ICD-10-CM | POA: Insufficient documentation

## 2017-09-09 DIAGNOSIS — I10 Essential (primary) hypertension: Secondary | ICD-10-CM | POA: Insufficient documentation

## 2017-09-09 DIAGNOSIS — Z7951 Long term (current) use of inhaled steroids: Secondary | ICD-10-CM | POA: Diagnosis not present

## 2017-09-09 HISTORY — PX: QUADRICEPS TENDON REPAIR: SHX756

## 2017-09-09 SURGERY — REPAIR, TENDON, QUADRICEPS
Anesthesia: General | Site: Knee | Laterality: Left | Wound class: Clean

## 2017-09-09 MED ORDER — NEOMYCIN-POLYMYXIN B GU 40-200000 IR SOLN
Status: DC | PRN
  Administered 2017-09-09: 300 mL

## 2017-09-09 MED ORDER — PROPOFOL 10 MG/ML IV BOLUS
INTRAVENOUS | Status: AC
  Filled 2017-09-09: qty 20

## 2017-09-09 MED ORDER — MELOXICAM 7.5 MG PO TABS
15.0000 mg | ORAL_TABLET | ORAL | Status: AC
  Administered 2017-09-09: 15 mg via ORAL

## 2017-09-09 MED ORDER — LACTATED RINGERS IV SOLN
INTRAVENOUS | Status: DC
  Administered 2017-09-09 (×2): via INTRAVENOUS

## 2017-09-09 MED ORDER — EPHEDRINE SULFATE 50 MG/ML IJ SOLN
INTRAMUSCULAR | Status: DC | PRN
  Administered 2017-09-09 (×2): 10 mg via INTRAVENOUS

## 2017-09-09 MED ORDER — GABAPENTIN 400 MG PO CAPS: 400.0000 mg | ORAL_CAPSULE | Freq: Two times a day (BID) | ORAL | 3 refills | Status: DC

## 2017-09-09 MED ORDER — GABAPENTIN 300 MG PO CAPS
300.0000 mg | ORAL_CAPSULE | ORAL | Status: AC
  Administered 2017-09-09: 300 mg via ORAL

## 2017-09-09 MED ORDER — MIDAZOLAM HCL 2 MG/2ML IJ SOLN
INTRAMUSCULAR | Status: AC
  Filled 2017-09-09: qty 2

## 2017-09-09 MED ORDER — FAMOTIDINE 20 MG PO TABS
ORAL_TABLET | ORAL | Status: AC
  Administered 2017-09-09: 20 mg via ORAL
  Filled 2017-09-09: qty 1

## 2017-09-09 MED ORDER — MELOXICAM 15 MG PO TABS: 15.0000 mg | ORAL_TABLET | Freq: Every day | ORAL | 2 refills | Status: DC

## 2017-09-09 MED ORDER — GLYCOPYRROLATE 0.2 MG/ML IJ SOLN
INTRAMUSCULAR | Status: DC | PRN
  Administered 2017-09-09: 0.2 mg via INTRAVENOUS

## 2017-09-09 MED ORDER — ONDANSETRON HCL 4 MG/2ML IJ SOLN
4.0000 mg | Freq: Once | INTRAMUSCULAR | Status: DC | PRN
Start: 2017-09-09 — End: 2017-09-09

## 2017-09-09 MED ORDER — HYDROCODONE-ACETAMINOPHEN 5-325 MG PO TABS
ORAL_TABLET | ORAL | Status: AC
  Filled 2017-09-09: qty 1

## 2017-09-09 MED ORDER — MIDAZOLAM HCL 2 MG/2ML IJ SOLN
INTRAMUSCULAR | Status: DC | PRN
  Administered 2017-09-09: 2 mg via INTRAVENOUS

## 2017-09-09 MED ORDER — CLINDAMYCIN PHOSPHATE 600 MG/50ML IV SOLN
INTRAVENOUS | Status: AC
  Filled 2017-09-09: qty 50

## 2017-09-09 MED ORDER — FENTANYL CITRATE (PF) 100 MCG/2ML IJ SOLN
INTRAMUSCULAR | Status: AC
  Filled 2017-09-09: qty 2

## 2017-09-09 MED ORDER — BUPIVACAINE HCL (PF) 0.5 % IJ SOLN
INTRAMUSCULAR | Status: AC
  Filled 2017-09-09: qty 30

## 2017-09-09 MED ORDER — ONDANSETRON HCL 4 MG/2ML IJ SOLN
INTRAMUSCULAR | Status: AC
  Filled 2017-09-09: qty 2

## 2017-09-09 MED ORDER — ACETAMINOPHEN 10 MG/ML IV SOLN
INTRAVENOUS | Status: DC | PRN
  Administered 2017-09-09: 1000 mg via INTRAVENOUS

## 2017-09-09 MED ORDER — NEOMYCIN-POLYMYXIN B GU 40-200000 IR SOLN
Status: AC
  Filled 2017-09-09: qty 20

## 2017-09-09 MED ORDER — MELOXICAM 7.5 MG PO TABS
ORAL_TABLET | ORAL | Status: AC
  Administered 2017-09-09: 15 mg via ORAL
  Filled 2017-09-09: qty 2

## 2017-09-09 MED ORDER — FENTANYL CITRATE (PF) 100 MCG/2ML IJ SOLN
INTRAMUSCULAR | Status: AC
  Administered 2017-09-09: 25 ug via INTRAVENOUS
  Filled 2017-09-09: qty 2

## 2017-09-09 MED ORDER — ONDANSETRON HCL 4 MG/2ML IJ SOLN
INTRAMUSCULAR | Status: DC | PRN
  Administered 2017-09-09: 4 mg via INTRAVENOUS

## 2017-09-09 MED ORDER — PROPOFOL 10 MG/ML IV BOLUS
INTRAVENOUS | Status: AC
Start: 2017-09-09 — End: ?
  Filled 2017-09-09: qty 20

## 2017-09-09 MED ORDER — FAMOTIDINE 20 MG PO TABS
20.0000 mg | ORAL_TABLET | Freq: Once | ORAL | Status: AC
  Administered 2017-09-09: 20 mg via ORAL

## 2017-09-09 MED ORDER — BUPIVACAINE HCL (PF) 0.5 % IJ SOLN
INTRAMUSCULAR | Status: DC | PRN
  Administered 2017-09-09: 30 mL

## 2017-09-09 MED ORDER — FENTANYL CITRATE (PF) 100 MCG/2ML IJ SOLN
25.0000 ug | INTRAMUSCULAR | Status: AC | PRN
  Administered 2017-09-09 (×6): 25 ug via INTRAVENOUS

## 2017-09-09 MED ORDER — CHLORHEXIDINE GLUCONATE CLOTH 2 % EX PADS: 6.0000 | MEDICATED_PAD | Freq: Once | CUTANEOUS | Status: DC

## 2017-09-09 MED ORDER — GABAPENTIN 300 MG PO CAPS
ORAL_CAPSULE | ORAL | Status: AC
  Administered 2017-09-09: 300 mg via ORAL
  Filled 2017-09-09: qty 1

## 2017-09-09 MED ORDER — HYDROCODONE-ACETAMINOPHEN 5-325 MG PO TABS
1.0000 | ORAL_TABLET | Freq: Four times a day (QID) | ORAL | Status: DC | PRN
  Administered 2017-09-09: 1 via ORAL

## 2017-09-09 MED ORDER — HYDROCODONE-ACETAMINOPHEN 5-325 MG PO TABS: 1.0000 | ORAL_TABLET | Freq: Four times a day (QID) | ORAL | 0 refills | Status: DC | PRN

## 2017-09-09 MED ORDER — DEXAMETHASONE SODIUM PHOSPHATE 10 MG/ML IJ SOLN
INTRAMUSCULAR | Status: DC | PRN
  Administered 2017-09-09: 10 mg via INTRAVENOUS

## 2017-09-09 MED ORDER — PROPOFOL 10 MG/ML IV BOLUS
INTRAVENOUS | Status: DC | PRN
  Administered 2017-09-09: 40 mg via INTRAVENOUS

## 2017-09-09 MED ORDER — CEFAZOLIN SODIUM-DEXTROSE 2-3 GM-%(50ML) IV SOLR
INTRAVENOUS | Status: AC
  Filled 2017-09-09: qty 50

## 2017-09-09 MED ORDER — FENTANYL CITRATE (PF) 100 MCG/2ML IJ SOLN
INTRAMUSCULAR | Status: DC | PRN
  Administered 2017-09-09: 50 ug via INTRAVENOUS
  Administered 2017-09-09: 25 ug via INTRAVENOUS
  Administered 2017-09-09 (×2): 50 ug via INTRAVENOUS
  Administered 2017-09-09: 25 ug via INTRAVENOUS

## 2017-09-09 SURGICAL SUPPLY — 33 items
BUR 4X45 EGG (BURR) ×4 IMPLANT
CANISTER SUCT 1200ML W/VALVE (MISCELLANEOUS) ×2 IMPLANT
CHLORAPREP W/TINT 26ML (MISCELLANEOUS) ×2 IMPLANT
COOLER POLAR GLACIER W/PUMP (MISCELLANEOUS) ×2 IMPLANT
CUFF TOURN 24 STER (MISCELLANEOUS) IMPLANT
CUFF TOURN 30 STER DUAL PORT (MISCELLANEOUS) ×2 IMPLANT
DRSG AQUACEL AG ADV 3.5X 6 (GAUZE/BANDAGES/DRESSINGS) ×2 IMPLANT
ELECT REM PT RETURN 9FT ADLT (ELECTROSURGICAL) ×2
ELECTRODE REM PT RTRN 9FT ADLT (ELECTROSURGICAL) ×1 IMPLANT
GAUZE SPONGE 4X4 12PLY STRL (GAUZE/BANDAGES/DRESSINGS) ×2 IMPLANT
GLOVE INDICATOR 8.0 STRL GRN (GLOVE) ×2 IMPLANT
GLOVE SURG ORTHO 8.5 STRL (GLOVE) ×2 IMPLANT
GOWN STRL REUS W/ TWL LRG LVL3 (GOWN DISPOSABLE) ×1 IMPLANT
GOWN STRL REUS W/TWL LRG LVL3 (GOWN DISPOSABLE) ×1
GOWN STRL REUS W/TWL LRG LVL4 (GOWN DISPOSABLE) ×2 IMPLANT
IMMOB KNEE 24 THIGH 24 443303 (SOFTGOODS) ×4 IMPLANT
KIT TURNOVER KIT A (KITS) ×2 IMPLANT
NEEDLE SPNL 20GX3.5 QUINCKE YW (NEEDLE) ×2 IMPLANT
NS IRRIG 1000ML POUR BTL (IV SOLUTION) ×2 IMPLANT
PACK TOTAL KNEE (MISCELLANEOUS) ×2 IMPLANT
PAD WRAPON POLAR KNEE (MISCELLANEOUS) ×1 IMPLANT
PASSER SUT SWANSON 36MM LOOP (INSTRUMENTS) ×2 IMPLANT
RETRIEVER SUT HEWSON (MISCELLANEOUS) ×4 IMPLANT
STAPLER SKIN PROX 35W (STAPLE) ×2 IMPLANT
SUT FIBERWIRE #5 38 CONV BLUE (SUTURE) ×6
SUT ORTHOCORD OS-6 NDL 36 (SUTURE) ×2 IMPLANT
SUT QUILL 0 20X36 (SUTURE) ×2 IMPLANT
SUT QUILL PDO 0 36 36 VIOLET (SUTURE) ×2 IMPLANT
SUT TICRON 2-0 30IN 311381 (SUTURE) ×4 IMPLANT
SUT VIC AB 2-0 CT1 27 (SUTURE) ×1
SUT VIC AB 2-0 CT1 TAPERPNT 27 (SUTURE) ×1 IMPLANT
SUTURE FIBERWR #5 38 CONV BLUE (SUTURE) ×3 IMPLANT
WRAPON POLAR PAD KNEE (MISCELLANEOUS) ×2

## 2017-09-09 NOTE — Anesthesia Preprocedure Evaluation (Signed)
Anesthesia Evaluation  Patient identified by MRN, date of birth, ID band Patient awake    Reviewed: Allergy & Precautions, NPO status , Patient's Chart, lab work & pertinent test results, reviewed documented beta blocker date and time   Airway Mallampati: III  TM Distance: >3 FB     Dental  (+) Chipped   Pulmonary sleep apnea , Current Smoker,           Cardiovascular hypertension, Pt. on medications      Neuro/Psych    GI/Hepatic GERD  Controlled,  Endo/Other    Renal/GU      Musculoskeletal  (+) Arthritis ,   Abdominal   Peds  Hematology   Anesthesia Other Findings   Reproductive/Obstetrics                             Anesthesia Physical Anesthesia Plan  ASA: III  Anesthesia Plan: General   Post-op Pain Management:    Induction: Intravenous  PONV Risk Score and Plan:   Airway Management Planned: LMA  Additional Equipment:   Intra-op Plan:   Post-operative Plan:   Informed Consent: I have reviewed the patients History and Physical, chart, labs and discussed the procedure including the risks, benefits and alternatives for the proposed anesthesia with the patient or authorized representative who has indicated his/her understanding and acceptance.     Plan Discussed with: CRNA  Anesthesia Plan Comments:         Anesthesia Quick Evaluation

## 2017-09-09 NOTE — Progress Notes (Signed)
Blood pressure 158/102  Dr Marcello Moores called  No new orders

## 2017-09-09 NOTE — Anesthesia Procedure Notes (Signed)
Procedure Name: LMA Insertion Date/Time: 09/09/2017 7:54 AM Performed by: Lorie Apley, CRNA Pre-anesthesia Checklist: Patient identified, Patient being monitored, Timeout performed, Emergency Drugs available and Suction available Patient Re-evaluated:Patient Re-evaluated prior to induction Oxygen Delivery Method: Circle system utilized Preoxygenation: Pre-oxygenation with 100% oxygen Induction Type: IV induction Ventilation: Mask ventilation without difficulty LMA: LMA inserted LMA Size: 5.0 Tube type: Oral Number of attempts: 1 Placement Confirmation: positive ETCO2 and breath sounds checked- equal and bilateral Tube secured with: Tape Dental Injury: Teeth and Oropharynx as per pre-operative assessment

## 2017-09-09 NOTE — Op Note (Signed)
   09/09/2017  9:28 AM  PATIENT:  Jack Weaver    PRE-OPERATIVE DIAGNOSIS:  W09.811B Strain of left quadricpes muscle, fascia and tendon, init  POST-OPERATIVE DIAGNOSIS:  Same  PROCEDURE:  REPAIR QUADRICEP TENDON LEFT  SURGEON:  Park Breed, MD  ANESTHESIA:   General  PREOPERATIVE INDICATIONS:  [name] is a  62 y.o. male with a diagnosis of S76.112A Strain of left quadricpes muscle, fascia and tendon, init who failed conservative measures and elected for surgical management.    The risks benefits and alternatives were discussed with the patient preoperatively including but not limited to the risks of infection, bleeding, nerve injury, cardiopulmonary complications, the need for revision surgery, among others, and the patient was willing to proceed.  EBL: None  TOURNIQUET TIME: 55  MIN  OPERATIVE IMPLANTS: 3 #5 Fiberwire sutures  OPERATIVE FINDINGS: Complete left quadriceps and capsule rupture  OPERATIVE PROCEDURE: The patient was brought to the operating room and underwent general endotracheal anesthesia in the supine position. The operative leg was prepped and draped in a sterile fashion. Esmarch was applied and tourniquet inflated to 350 mmHg. An anterior midline incision was made and dissection carried out sharply through subcutaneous tissue. . A complete rupture of the quadriceps was seen along with the retinaculum to the midlines. Blood and clots were removed and irrigated. The tendon ends were freshened up. The superior aspect of patella was exposed and a bur used to freshen up the bone. 4 drill holes were then made through the patella proximal to distal.  3 #5 Fiberwire sutures were then passed through the quadriceps tendon proximally in a zigzag fashion and with the knee in full extension, the sutures were tied securely, bringing the tendon back to anatomic position. The tendons were further repaired with multiple #2 Orthocord sutures. Subcutaneous tissue was  closed with0 Quill suture and the skin was closed with staples. Sponge and needle counts were correct. 1/2% Sensorcaine was placed in the soft tissues. Dry sterile dressing was applied along with Polar Care and knee immobilizer. Tourniquet was deflated with good return of blood flow to the foot. The patient was awakened and taken to recovery in good condition.  Park Breed, MD

## 2017-09-09 NOTE — Discharge Instructions (Signed)

## 2017-09-09 NOTE — H&P (Signed)
THE PATIENT WAS SEEN PRIOR TO SURGERY TODAY.  HISTORY, ALLERGIES, HOME MEDICATIONS AND OPERATIVE PROCEDURE WERE REVIEWED. RISKS AND BENEFITS OF SURGERY DISCUSSED WITH PATIENT AGAIN.  NO CHANGES FROM INITIAL HISTORY AND PHYSICAL NOTED.    

## 2017-09-09 NOTE — Progress Notes (Signed)
Pt thinks that he is [place]  Reassurance given frequently

## 2017-09-09 NOTE — Transfer of Care (Signed)
Immediate Anesthesia Transfer of Care Note  Patient: Jack Weaver  Procedure(s) Performed: REPAIR QUADRICEP TENDON (Left Knee)  Patient Location: PACU  Anesthesia Type:General  Level of Consciousness: awake, alert  and oriented  Airway & Oxygen Therapy: Patient Spontanous Breathing and Patient connected to face mask oxygen  Post-op Assessment: Report given to RN and Post -op Vital signs reviewed and stable  Post vital signs: Reviewed and stable  Last Vitals:  Vitals Value Taken Time  BP    Temp    Pulse 97 09/09/2017  9:41 AM  Resp 19 09/09/2017  9:41 AM  SpO2 100 % 09/09/2017  9:41 AM  Vitals shown include unvalidated device data.  Last Pain:  Vitals:   09/09/17 0626  TempSrc: Temporal  PainSc: 3          Complications: No apparent anesthesia complications

## 2017-09-09 NOTE — Anesthesia Postprocedure Evaluation (Signed)
Anesthesia Post Note  Patient: Jack Weaver  Procedure(s) Performed: REPAIR QUADRICEP TENDON (Left Knee)  Patient location during evaluation: PACU Anesthesia Type: General Level of consciousness: awake and alert Pain management: pain level controlled Vital Signs Assessment: post-procedure vital signs reviewed and stable Respiratory status: spontaneous breathing, nonlabored ventilation, respiratory function stable and patient connected to nasal cannula oxygen Cardiovascular status: blood pressure returned to baseline and stable Postop Assessment: no apparent nausea or vomiting Anesthetic complications: no     Last Vitals:  Vitals:   09/09/17 1054 09/09/17 1120  BP: (!) 160/75 (!) 162/75  Pulse: (!) 57 (!) 58  Resp: 16 16  Temp: 36.4 C   SpO2: 100% 100%    Last Pain:  Vitals:   09/09/17 1120  TempSrc:   PainSc: [place]

## 2017-09-09 NOTE — Anesthesia Post-op Follow-up Note (Signed)
Anesthesia QCDR form completed.        

## 2017-10-16 DIAGNOSIS — M7542 Impingement syndrome of left shoulder: Secondary | ICD-10-CM | POA: Diagnosis not present

## 2017-10-16 DIAGNOSIS — M5412 Radiculopathy, cervical region: Secondary | ICD-10-CM | POA: Diagnosis not present

## 2017-10-20 DIAGNOSIS — Z9889 Other specified postprocedural states: Secondary | ICD-10-CM | POA: Diagnosis not present

## 2018-01-05 ENCOUNTER — Ambulatory Visit: Payer: 59 | Admitting: Family Medicine

## 2018-01-05 VITALS — BP 163/80 | HR 55 | Temp 98.5°F | Resp 16 | Wt 217.0 lb

## 2018-01-05 DIAGNOSIS — K219 Gastro-esophageal reflux disease without esophagitis: Secondary | ICD-10-CM | POA: Diagnosis not present

## 2018-01-05 DIAGNOSIS — R1013 Epigastric pain: Secondary | ICD-10-CM | POA: Diagnosis present

## 2018-01-05 DIAGNOSIS — K2970 Gastritis, unspecified, without bleeding: Secondary | ICD-10-CM

## 2018-01-05 DIAGNOSIS — Z79899 Other long term (current) drug therapy: Secondary | ICD-10-CM | POA: Diagnosis not present

## 2018-01-05 DIAGNOSIS — R1011 Right upper quadrant pain: Secondary | ICD-10-CM | POA: Diagnosis not present

## 2018-01-05 DIAGNOSIS — F17290 Nicotine dependence, other tobacco product, uncomplicated: Secondary | ICD-10-CM | POA: Diagnosis not present

## 2018-01-05 DIAGNOSIS — I10 Essential (primary) hypertension: Secondary | ICD-10-CM | POA: Diagnosis not present

## 2018-01-05 DIAGNOSIS — Z125 Encounter for screening for malignant neoplasm of prostate: Secondary | ICD-10-CM | POA: Diagnosis not present

## 2018-01-05 DIAGNOSIS — R109 Unspecified abdominal pain: Secondary | ICD-10-CM | POA: Diagnosis not present

## 2018-01-05 MED ORDER — OMEPRAZOLE 20 MG PO CPDR: 20.0000 mg | DELAYED_RELEASE_CAPSULE | Freq: Every day | ORAL | 3 refills | Status: DC

## 2018-01-05 NOTE — Progress Notes (Signed)
Jack Weaver  MRN: [id number] DOB: [birth year]  Subjective:  HPI  The patient is a 62 year old male who presents for evaluation of abdominal pain.  He describes it as feeling like a runners cramp.  He has been experiencing it for a few months.  He reports that exercise makes it feel better.  He states that it is on the right side and radiates into his back at times.  No nausea or vomiting.  He feels like he is bloated with it.  MSK pain. He has been taking meloxicam regularly for MSK pain.It is not related to food.  Patient Active Problem List   Diagnosis Date Noted  . Lumbar sprain 09/04/2017  . Traumatic rupture of quadriceps tendon 09/02/2017  . Bursitis of hip 05/31/2015  . ED (erectile dysfunction) of organic origin 11/18/2014  . BP (high blood pressure) 11/18/2014  . Neuropathy 11/18/2014  . Allergic rhinitis 11/14/2014  . Arthropathia 11/26/2009  . History of tobacco use 07/31/2008  . Acid reflux 07/26/2008  . Arthritis, degenerative 07/26/2008  . Apnea, sleep 06/30/2008    Past Medical History:  Diagnosis Date  . Arthritis   . Hypertension     Social History   Socioeconomic History  . Marital status: Married    Spouse name: Not on file  . Number of children: Not on file  . Years of education: Not on file  . Highest education level: Not on file  Occupational History  . Not on file  Social Needs  . Financial resource strain: Not on file  . Food insecurity:    Worry: Not on file    Inability: Not on file  . Transportation needs:    Medical: Not on file    Non-medical: Not on file  Tobacco Use  . Smoking status: Current Some Day Smoker    Types: Cigars  . Smokeless tobacco: Never Used  . Tobacco comment: quit in 06/2013. Pt recently started 1 cigarette daily. Now smokes once a week.  Substance and Sexual Activity  . Alcohol use: Yes    Alcohol/week: 0.0 standard drinks    Comment: Wine 5x week  . Drug use: No  . Sexual activity: Not on file  Lifestyle   . Physical activity:    Days per week: Not on file    Minutes per session: Not on file  . Stress: Not on file  Relationships  . Social connections:    Talks on phone: Not on file    Gets together: Not on file    Attends religious service: Not on file    Active member of club or organization: Not on file    Attends meetings of clubs or organizations: Not on file    Relationship status: Not on file  . Intimate partner violence:    Fear of current or ex partner: Not on file    Emotionally abused: Not on file    Physically abused: Not on file    Forced sexual activity: Not on file  Other Topics Concern  . Not on file  Social History Narrative  . Not on file    Outpatient Encounter Medications as of 01/05/2018  Medication Sig  . acetaminophen (TYLENOL) 500 MG tablet Take 1,000 mg by mouth every 8 (eight) hours as needed for mild pain or moderate pain.  Marland Kitchen diltiazem (CARDIZEM CD) 240 MG 24 hr capsule TAKE 1 CAPSULE BY MOUTH EVERY DAY  . fluticasone (FLONASE) 50 MCG/ACT nasal spray USE 2 SPRAYS EACH  NOSTRIL EVERY DAY  . lisinopril (PRINIVIL,ZESTRIL) 40 MG tablet TAKE 1 TABLET BY MOUTH DAILY  . meloxicam (MOBIC) 15 MG tablet Take 1 tablet (15 mg total) by mouth daily.  . Multiple Vitamin (MULTIVITAMIN WITH MINERALS) TABS tablet Take 1 tablet by mouth daily.  . [DISCONTINUED] gabapentin (NEURONTIN) 400 MG capsule Take 1 capsule (400 mg total) by mouth 2 (two) times daily.  . [DISCONTINUED] HYDROcodone-acetaminophen (NORCO) 5-325 MG tablet Take 1-2 tablets by mouth every 6 (six) hours as needed.  . [DISCONTINUED] traMADol (ULTRAM) 50 MG tablet    No facility-administered encounter medications on file as of 01/05/2018.     No Known Allergies  Review of Systems  Constitutional: Negative for fever, malaise/fatigue and weight loss.  Eyes: Negative.   Respiratory: Negative.   Cardiovascular: Negative.   Gastrointestinal: Positive for abdominal pain. Negative for blood in stool,  constipation, diarrhea, heartburn, melena, nausea and vomiting.  Genitourinary: Positive for frequency. Negative for dysuria, flank pain, hematuria and urgency.  Musculoskeletal: Positive for back pain, joint pain (shoulder not related to the othe rpain.) and myalgias. Negative for falls and neck pain.  Skin: Negative.   Endo/Heme/Allergies: Negative.   Psychiatric/Behavioral: Negative.     Objective:  BP (!) 163/80 (BP Location: Right Arm, Patient Position: Sitting, Cuff Size: Normal)   Pulse (!) 55   Temp 98.5 F (36.9 C) (Oral)   Resp 16   Wt 217 lb (98.4 kg)   BMI 29.43 kg/m   Physical Exam  Constitutional: He is oriented to person, place, and time and well-developed, well-nourished, and in no distress.  HENT:  Head: Normocephalic and atraumatic.  Right Ear: External ear normal.  Left Ear: External ear normal.  Nose: Nose normal.  Eyes: Conjunctivae are normal. No scleral icterus.  Neck: No thyromegaly present.  Cardiovascular: Normal rate, regular rhythm and normal heart sounds.  Pulmonary/Chest: Effort normal and breath sounds normal.  Abdominal: Soft. There is tenderness.  Mild epigastric tenderness.  Musculoskeletal: He exhibits no edema.  Neurological: He is alert and oriented to person, place, and time. Gait normal. GCS score is 15.  Skin: Skin is warm and dry.  Psychiatric: Mood, memory, affect and judgment normal.    Assessment and Plan :  1. Right upper quadrant abdominal pain  - Comprehensive metabolic panel - CBC with Differential/Platelet  2. Gastritis without bleeding, unspecified chronicity, unspecified gastritis type Stop Mobic.RTC 2-3 weeks. - omeprazole (PRILOSEC) 20 MG capsule; Take 1 capsule (20 mg total) by mouth daily.  Dispense: 30 capsule; Refill: 3 - Comprehensive metabolic panel - CBC with Differential/Platelet  3. Prostate cancer screening  - PSA  I have done the exam and reviewed the chart and it is accurate to the best of my  knowledge. Development worker, community has been used and  any errors in dictation or transcription are unintentional. Miguel Aschoff M.D. [facility]

## 2018-01-05 NOTE — ED Triage Notes (Signed)
Patient ambulatory to triage with steady gait, without difficulty or distress noted; pt reports mid upper abd pain radiating around into back x 1-41months; st seen PCP today and told pain was due to recent rx for meloxicam; denies any accomp symptoms; denies hx of sane

## 2018-01-06 ENCOUNTER — Emergency Department
Admission: EM | Admit: 2018-01-06 | Discharge: 2018-01-06 | Disposition: A | Discharge status: Home / Self Care | Payer: 59 | Attending: Emergency Medicine | Admitting: Emergency Medicine

## 2018-01-06 ENCOUNTER — Other Ambulatory Visit: Payer: Self-pay

## 2018-01-06 ENCOUNTER — Encounter: Payer: Self-pay | Admitting: Emergency Medicine

## 2018-01-06 ENCOUNTER — Emergency Department: Payer: 59

## 2018-01-06 DIAGNOSIS — R1013 Epigastric pain: Secondary | ICD-10-CM

## 2018-01-06 DIAGNOSIS — R109 Unspecified abdominal pain: Secondary | ICD-10-CM | POA: Diagnosis not present

## 2018-01-06 DIAGNOSIS — K219 Gastro-esophageal reflux disease without esophagitis: Secondary | ICD-10-CM

## 2018-01-06 LAB — CBC WITH DIFFERENTIAL/PLATELET
Basophils Absolute: 0 10*3/uL (ref 0.0–0.2)
Basophils Absolute: 0 10*3/uL (ref 0–0.1)
Basophils Relative: 1 %
Basos: 0 %
EOS (ABSOLUTE): 0.1 10*3/uL (ref 0.0–0.4)
Eos: 1 %
Eosinophils Absolute: 0.1 10*3/uL (ref 0–0.7)
Eosinophils Relative: 2 %
HCT: 39.2 % — ABNORMAL LOW (ref 40.0–52.0)
Hematocrit: 39.6 % (ref 37.5–51.0)
Hemoglobin: 13.3 g/dL (ref 13.0–17.7)
Hemoglobin: 13.3 g/dL (ref 13.0–18.0)
Immature Grans (Abs): 0 10*3/uL (ref 0.0–0.1)
Immature Granulocytes: 0 %
Lymphocytes Absolute: 1.8 10*3/uL (ref 0.7–3.1)
Lymphocytes Relative: 54 %
Lymphs Abs: 2.2 10*3/uL (ref 1.0–3.6)
Lymphs: 52 %
MCH: 28.4 pg (ref 26.6–33.0)
MCH: 28.8 pg (ref 26.0–34.0)
MCHC: 33.6 g/dL (ref 31.5–35.7)
MCHC: 34 g/dL (ref 32.0–36.0)
MCV: 84 fL (ref 79–97)
MCV: 84.7 fL (ref 80.0–100.0)
Monocytes Absolute: 0.4 10*3/uL (ref 0.1–0.9)
Monocytes Absolute: 0.5 10*3/uL (ref 0.2–1.0)
Monocytes Relative: 13 %
Monocytes: 11 %
Neutro Abs: 1.2 10*3/uL — ABNORMAL LOW (ref 1.4–6.5)
Neutrophils Absolute: 1.3 10*3/uL — ABNORMAL LOW (ref 1.4–7.0)
Neutrophils Relative %: 30 %
Neutrophils: 36 %
Platelets: 167 10*3/uL (ref 150–440)
Platelets: 187 10*3/uL (ref 150–450)
RBC: 4.63 MIL/uL (ref 4.40–5.90)
RBC: 4.69 x10E6/uL (ref 4.14–5.80)
RDW: 13.7 % (ref 11.5–14.5)
RDW: 14.6 % (ref 12.3–15.4)
WBC: 3.5 10*3/uL (ref 3.4–10.8)
WBC: 3.9 10*3/uL (ref 3.8–10.6)

## 2018-01-06 LAB — URINALYSIS, COMPLETE (UACMP) WITH MICROSCOPIC
Bacteria, UA: NONE SEEN
Bilirubin Urine: NEGATIVE
Glucose, UA: NEGATIVE mg/dL
Ketones, ur: NEGATIVE mg/dL
Leukocytes, UA: NEGATIVE
Nitrite: NEGATIVE
Protein, ur: NEGATIVE mg/dL
Specific Gravity, Urine: 1.003 — ABNORMAL LOW (ref 1.005–1.030)
Squamous Epithelial / LPF: NONE SEEN (ref 0–5)
WBC, UA: NONE SEEN WBC/hpf (ref 0–5)
pH: 7 (ref 5.0–8.0)

## 2018-01-06 LAB — COMPREHENSIVE METABOLIC PANEL
ALT: 19 IU/L (ref 0–44)
ALT: 23 U/L (ref 0–44)
AST: 21 IU/L (ref 0–40)
AST: 27 U/L (ref 15–41)
Albumin/Globulin Ratio: 2 (ref 1.2–2.2)
Albumin: 4.1 g/dL (ref 3.6–4.8)
Albumin: 4.2 g/dL (ref 3.5–5.0)
Alkaline Phosphatase: 108 U/L (ref 38–126)
Alkaline Phosphatase: 111 IU/L (ref 39–117)
Anion gap: 7 (ref 5–15)
BUN/Creatinine Ratio: 17 (ref 10–24)
BUN: 17 mg/dL (ref 8–27)
BUN: 19 mg/dL (ref 8–23)
Bilirubin Total: 0.4 mg/dL (ref 0.0–1.2)
CO2: 21 mmol/L (ref 20–29)
CO2: 27 mmol/L (ref 22–32)
Calcium: 8.6 mg/dL — ABNORMAL LOW (ref 8.9–10.3)
Calcium: 9.2 mg/dL (ref 8.6–10.2)
Chloride: 108 mmol/L — ABNORMAL HIGH (ref 96–106)
Chloride: 109 mmol/L (ref 98–111)
Creatinine, Ser: 0.92 mg/dL (ref 0.61–1.24)
Creatinine, Ser: 1.02 mg/dL (ref 0.76–1.27)
GFR calc Af Amer: 89 mL/min/{1.73_m2} (ref >59)
GFR calc Af Amer: >60 mL/min (ref >60)
GFR calc non Af Amer: 77 mL/min/{1.73_m2} (ref >59)
GFR calc non Af Amer: >60 mL/min (ref >60)
Globulin, Total: 2.1 g/dL (ref 1.5–4.5)
Glucose, Bld: 98 mg/dL (ref 70–99)
Glucose: 99 mg/dL (ref 65–99)
Potassium: 3.4 mmol/L — ABNORMAL LOW (ref 3.5–5.1)
Potassium: 3.9 mmol/L (ref 3.5–5.2)
Sodium: 142 mmol/L (ref 134–144)
Sodium: 143 mmol/L (ref 135–145)
Total Bilirubin: 0.6 mg/dL (ref 0.3–1.2)
Total Protein: 6.2 g/dL (ref 6.0–8.5)
Total Protein: 6.9 g/dL (ref 6.5–8.1)

## 2018-01-06 LAB — PSA: Prostate Specific Ag, Serum: 6.1 ng/mL — ABNORMAL HIGH (ref 0.0–4.0)

## 2018-01-06 LAB — LIPASE, BLOOD: Lipase: 38 U/L (ref 11–51)

## 2018-01-06 LAB — TROPONIN I: Troponin I: <0.03 ng/mL (ref <0.03)

## 2018-01-06 MED ORDER — OMEPRAZOLE 40 MG PO CPDR: 40.0000 mg | DELAYED_RELEASE_CAPSULE | Freq: Every day | ORAL | 0 refills | Status: DC

## 2018-01-06 MED ORDER — IOHEXOL 300 MG/ML  SOLN
100.0000 mL | Freq: Once | INTRAMUSCULAR | Status: AC | PRN
  Administered 2018-01-06: 100 mL via INTRAVENOUS

## 2018-01-06 NOTE — ED Provider Notes (Signed)
[facility] Emergency Department Provider Note    First MD Initiated Contact with Patient 01/06/18 0050     (approximate)  I have reviewed the triage vital signs and the nursing notes.   HISTORY  Chief Complaint Abdominal Pain    HPI Jack Weaver is a 62 y.o. male presents to the emergency department with 32-month history of intermittent epigastric/right upper quadrant abdominal pain.  Patient states that his primary care provider informed him that it was secondary to taking meloxicam.  Patient was seen by his primary care provider today and prescribed Prilosec however he was unable to fill the prescription secondary to insurance reasons..  Patient states that he presents to the emergency department tonight secondary to increasing frequency of the pain.  Patient denies any fever no nausea vomiting diarrhea constipation.  Patient denies any urinary symptoms.  Past Medical History:  Diagnosis Date  . Arthritis   . Hypertension     Patient Active Problem List   Diagnosis Date Noted  . Lumbar sprain 09/04/2017  . Traumatic rupture of quadriceps tendon 09/02/2017  . Bursitis of hip 05/31/2015  . ED (erectile dysfunction) of organic origin 11/18/2014  . BP (high blood pressure) 11/18/2014  . Neuropathy 11/18/2014  . Allergic rhinitis 11/14/2014  . Arthropathia 11/26/2009  . History of tobacco use 07/31/2008  . Acid reflux 07/26/2008  . Arthritis, degenerative 07/26/2008  . Apnea, sleep 06/30/2008    Past Surgical History:  Procedure Laterality Date  . APPENDECTOMY     At age 84  . HEMORROIDECTOMY  1930  . QUADRICEPS TENDON REPAIR Left 09/09/2017   Procedure: REPAIR QUADRICEP TENDON;  Surgeon: Earnestine Leys, MD;  Location: [facility] ORS;  Service: Orthopedics;  Laterality: Left;    Prior to Admission medications   Medication Sig Start Date End Date Taking? Authorizing Provider  acetaminophen (TYLENOL) 500 MG tablet Take 1,000 mg by mouth every 8  (eight) hours as needed for mild pain or moderate pain.    [provider]  diltiazem (CARDIZEM CD) 240 MG 24 hr capsule TAKE 1 CAPSULE BY MOUTH EVERY DAY 08/25/17   [name], MD  fluticasone [facility]) 50 MCG/ACT nasal spray USE 2 SPRAYS EACH NOSTRIL EVERY DAY 04/20/17   [name], MD  lisinopril (PRINIVIL,ZESTRIL) 40 MG tablet TAKE 1 TABLET BY MOUTH DAILY 06/17/17   [name], MD  Multiple Vitamin (MULTIVITAMIN WITH MINERALS) TABS tablet Take 1 tablet by mouth daily.    [provider]  omeprazole (PRILOSEC) 20 MG capsule Take 1 capsule (20 mg total) by mouth daily. 01/05/18   [name], MD    Allergies No known drug allergies  Family History  Problem Relation Age of Onset  . Heart attack Mother   . Stroke Father   . Asthma Father   . Cancer Brother   . Diabetes Brother     Social History Social History   Tobacco Use  . Smoking status: Current Some Day Smoker    Types: Cigars  . Smokeless tobacco: Never Used  . Tobacco comment: quit in 06/2013. Pt recently started 1 cigarette daily. Now smokes once a week.  Substance Use Topics  . Alcohol use: Yes    Alcohol/week: 0.0 standard drinks    Comment: Wine 5x week  . Drug use: No    Review of Systems Constitutional: No fever/chills Eyes: No visual changes. ENT: No sore throat. Cardiovascular: Denies chest pain. Respiratory: Denies shortness of breath.  Gastrointestinal: Positive for abdominal pain.  No nausea, no vomiting.  No diarrhea.  No constipation. Genitourinary: Negative for dysuria. Musculoskeletal: Negative for neck pain.  Negative for back pain. Integumentary: Negative for rash. Neurological: Negative for headaches, focal weakness or numbness. *}  ____________________________________________   PHYSICAL EXAM:  VITAL SIGNS: ED Triage Vitals  Enc Vitals Group     BP 01/06/18 0007 (!) 171/90     Pulse Rate 01/06/18 0007 (!) 56     Resp 01/06/18  0007 18     Temp 01/06/18 0007 97.7 F (36.5 C)     Temp Source 01/06/18 0007 Oral     SpO2 01/06/18 0007 98 %     Weight 01/06/18 0000 98.4 kg (216 lb 14.9 oz)     Height 01/06/18 0000 1.803 m (5\' 11" )     Head Circumference --      Peak Flow --      Pain Score 01/06/18 0000 8     Pain Loc --      Pain Edu? --      Excl. in [place]? --     Constitutional: Alert and oriented. Well appearing and in no acute distress. Eyes: Conjunctivae are normal.  No scleral icterus Mouth/Throat: Mucous membranes are moist.   Neck: No stridor.   Cardiovascular: Normal rate, regular rhythm. Good peripheral circulation. Grossly normal heart sounds. Respiratory: Normal respiratory effort.  No retractions. Lungs CTAB. Gastrointestinal: Epigastric/right upper quadrant tenderness to palpation. No distention.   Musculoskeletal: No lower extremity tenderness nor edema. No gross deformities of extremities. Neurologic:  Normal speech and language. No gross focal neurologic deficits are appreciated.  Skin:  Skin is warm, dry and intact. No rash noted. Psychiatric: Mood and affect are normal. Speech and behavior are normal.  ____________________________________________   LABS (all labs ordered are listed, but only abnormal results are displayed)  Labs Reviewed  CBC WITH DIFFERENTIAL/PLATELET - Abnormal; Notable for the following components:      Result Value   HCT 39.2 (*)    Neutro Abs 1.2 (*)    All other components within normal limits  COMPREHENSIVE METABOLIC PANEL - Abnormal; Notable for the following components:   Potassium 3.4 (*)    Calcium 8.6 (*)    All other components within normal limits  URINALYSIS, COMPLETE (UACMP) WITH MICROSCOPIC - Abnormal; Notable for the following components:   Color, Urine COLORLESS (*)    APPearance CLEAR (*)    Specific Gravity, Urine 1.003 (*)    Hgb urine dipstick MODERATE (*)    All other components within normal limits  LIPASE, BLOOD  TROPONIN I    ____________________________________________  EKG  ED ECG REPORT I, [place] N Jaimie Redditt, the attending physician, personally viewed and interpreted this ECG.   Date: 01/06/2018  EKG Time: 12:07 AM  Rate: 55  Rhythm: Sinus bradycardia  Axis: Normal  Intervals: Normal  ST&T Change: None   RADIOLOGY I, [place] N Makaela Cando, personally viewed and evaluated these images (plain radiographs) as part of my medical decision making, as well as reviewing the written report by the radiologist.  ED MD interpretation: No acute abdominal pelvic pathology.  3.1 cm AAA, diverticula, right adrenal nodule noted per radiologist.  Official radiology report(s): Ct Abdomen Pelvis W Contrast  Result Date: 01/06/2018 CLINICAL DATA:  Upper abdominal pain radiating to the back for 2 months. EXAM: CT ABDOMEN AND PELVIS WITH CONTRAST TECHNIQUE: Multidetector CT imaging of the abdomen and pelvis was performed using the standard protocol following bolus  administration of intravenous contrast. CONTRAST:  134mL OMNIPAQUE IOHEXOL 300 MG/ML  SOLN COMPARISON:  09/23/2007 FINDINGS: Lower chest: Mild dependent changes in the lung bases. Hepatobiliary: Multiple hepatic cysts throughout the liver, ranging in size up to 5.7 cm. Gallbladder and bile ducts are unremarkable. Pancreas: Unremarkable. No pancreatic ductal dilatation or surrounding inflammatory changes. Spleen: Normal in size without focal abnormality. Adrenals/Urinary Tract: Right adrenal gland nodule measuring 2.7 cm. This is enlarged since previous study but given the long time spanned, this most likely represents a benign lesion such is adrenal adenoma. Kidneys, ureters, and the bladder are unremarkable. Stomach/Bowel: Stomach, small bowel, and colon are not abnormally distended. No wall thickening is identified although under distention limits evaluation of the wall in some areas. Scattered stool throughout the colon. Scattered colonic diverticula without evidence of  diverticulitis. Appendix is surgically absent. Vascular/Lymphatic: Dilated aorta at 3.1 cm maximal diameter. Aortic calcifications. No significant lymphadenopathy. Reproductive: Prostate gland is enlarged, measuring a bowel 6.2 cm diameter. Other: No abdominal wall hernia or abnormality. No abdominopelvic ascites. Musculoskeletal: No acute or significant osseous findings. IMPRESSION: 1. No acute process demonstrated in the abdomen or pelvis. No evidence of bowel obstruction or inflammation. 2. Multiple hepatic cysts. 3. Right adrenal gland nodule, mildly enlarged since previous study from 2009, likely benign. 4. **An incidental finding of potential clinical significance has been found. 3.1 cm diameter abdominal aortic aneurysm. Recommend followup by ultrasound in 3 years. This recommendation follows ACR consensus guidelines: White Paper of the ACR Incidental Findings Committee II on Vascular Findings. J Am Coll Radiol 2013; 10:789-. Electronically Signed   By: Lucienne Capers M.D.   On: 01/06/2018 02:00      Procedures   ____________________________________________   INITIAL IMPRESSION / ASSESSMENT AND PLAN / ED COURSE  As part of my medical decision making, I reviewed the following data within the electronic MEDICAL RECORD NUMBER   62 year old male presenting with above-stated history and physical exam secondary to abdominal discomfort.  CT scan of the abdomen was performed to evaluate for possible intra-abdominal pathology including gallstones diverticulosis or tumor given chronicity of pain.  CT scan of the abdomen and pelvis revealed no acute intra-abdominal findings.  Patient was notified of all clinical findings including those noted on the CT scan.  Patient given a good Rx coupon for Prilosec with recommendation to follow-up with primary care provider ____________________________________________  FINAL CLINICAL IMPRESSION(S) / ED DIAGNOSES  Final diagnoses:  Epigastric pain  Gastroesophageal  reflux disease without esophagitis     MEDICATIONS GIVEN DURING THIS VISIT:  Medications  iohexol (OMNIPAQUE) 300 MG/ML solution 100 mL (100 mLs Intravenous Contrast Given 01/06/18 0135)     ED Discharge Orders    None       Note:  This document was prepared using Dragon voice recognition software and may include unintentional dictation errors.   Gregor Hams, MD 01/06/18 [phone]

## 2018-01-06 NOTE — ED Notes (Signed)
Pt reports intermittent abdominal pain x approx 6 months. Pain has come and gone and feels like a "runners cramp" and a pressure. Pt can pinpoint area in RUQ/mid-upper quadrant where pain is. States this pain is also felt under right arm; states pain under arm feels "tired."   Hypertensive at this time. Pt reports taking lisinopril for blood pressure in the mornings.

## 2018-01-06 NOTE — ED Notes (Signed)
Report off to raquel rn  

## 2018-01-06 NOTE — ED Notes (Signed)
Patient discharged to home per MD order. Patient in stable condition, and deemed medically cleared by ED provider for discharge. Discharge instructions reviewed with patient/family using "Teach Back"; verbalized understanding of medication education and administration, and information about follow-up care. Denies further concerns. ° °

## 2018-01-12 ENCOUNTER — Telehealth: Payer: Self-pay

## 2018-01-12 DIAGNOSIS — R972 Elevated prostate specific antigen [PSA]: Secondary | ICD-10-CM

## 2018-01-12 NOTE — Telephone Encounter (Signed)
-----   Message from [name], MD sent at 01/11/2018 11:49 AM EDT ----- Hopefully pt feeling better. Labs Ok but PSA up 50%--refer to Urology at this time.

## 2018-01-12 NOTE — Telephone Encounter (Signed)
Left message to call back  

## 2018-01-12 NOTE — Telephone Encounter (Signed)
Advised patient of results. Order for referral was placed.

## 2018-01-18 ENCOUNTER — Ambulatory Visit: Payer: 59 | Admitting: Family Medicine

## 2018-01-18 VITALS — BP 144/86 | HR 52 | Temp 98.6°F | Resp 16 | Wt 214.0 lb

## 2018-01-18 DIAGNOSIS — K2970 Gastritis, unspecified, without bleeding: Secondary | ICD-10-CM | POA: Diagnosis not present

## 2018-01-18 DIAGNOSIS — M170 Bilateral primary osteoarthritis of knee: Secondary | ICD-10-CM | POA: Diagnosis not present

## 2018-01-18 MED ORDER — OMEPRAZOLE 40 MG PO CPDR: 40.0000 mg | DELAYED_RELEASE_CAPSULE | Freq: Every day | ORAL | 11 refills | Status: DC

## 2018-01-18 MED ORDER — OMEPRAZOLE 40 MG PO CPDR
40.0000 mg | DELAYED_RELEASE_CAPSULE | Freq: Every day | ORAL | 11 refills | Status: DC
Start: 2018-01-18 — End: 2018-11-09

## 2018-01-18 NOTE — Progress Notes (Signed)
NATION CRADLE  MRN: [id number] DOB: Nov 19, 1955  Subjective:  HPI   The patient is a 62 year old male who presents today to follow up after being seen on 01/06/18 in the ER for abdominal pain.  While in the ER he had an abdominal CT which revealed possible intra abdominal pathology including gallstone, diverticulosis or tumor.  He was given prescription for Prilosec and advised to follow up with his PCP. There was also an incidental finding on the scan of a 3.1 cm diameter abdominal aortic aneurysm.  It was recommended that this have ultrasound follow up in 3 years per recommended guidelines. The patient had been seen in our office the day before his ER visit and was started on Omeprazole 20 mg, the hospital started him on 40 mg and that was the prescription he got filled. The patient states that the Omeprazole is helping his symptoms some.   He also stated that his wife was concerned that the ER did not put him on an antibiotic.  She thought he needed to be on one.   The patient also complains that since being taken off of the Meloxicam he has been having more difficulty with stiffness and pain in his joints.  Patient Active Problem List   Diagnosis Date Noted  . Lumbar sprain 09/04/2017  . Traumatic rupture of quadriceps tendon 09/02/2017  . Bursitis of hip 05/31/2015  . ED (erectile dysfunction) of organic origin 11/18/2014  . BP (high blood pressure) 11/18/2014  . Neuropathy 11/18/2014  . Allergic rhinitis 11/14/2014  . Arthropathia 11/26/2009  . History of tobacco use 07/31/2008  . Acid reflux 07/26/2008  . Arthritis, degenerative 07/26/2008  . Apnea, sleep 06/30/2008    Past Medical History:  Diagnosis Date  . Arthritis   . Hypertension     Social History   Socioeconomic History  . Marital status: Married    Spouse name: Not on file  . Number of children: Not on file  . Years of education: Not on file  . Highest education level: Not on file  Occupational History  .  Not on file  Social Needs  . Financial resource strain: Not on file  . Food insecurity:    Worry: Not on file    Inability: Not on file  . Transportation needs:    Medical: Not on file    Non-medical: Not on file  Tobacco Use  . Smoking status: Current Some Day Smoker    Types: Cigars  . Smokeless tobacco: Never Used  . Tobacco comment: quit in 06/2013. Pt recently started 1 cigarette daily. Now smokes once a week.  Substance and Sexual Activity  . Alcohol use: Yes    Alcohol/week: 0.0 standard drinks    Comment: Wine 5x week  . Drug use: No  . Sexual activity: Not on file  Lifestyle  . Physical activity:    Days per week: Not on file    Minutes per session: Not on file  . Stress: Not on file  Relationships  . Social connections:    Talks on phone: Not on file    Gets together: Not on file    Attends religious service: Not on file    Active member of club or organization: Not on file    Attends meetings of clubs or organizations: Not on file    Relationship status: Not on file  . Intimate partner violence:    Fear of current or ex partner: Not on file  Emotionally abused: Not on file    Physically abused: Not on file    Forced sexual activity: Not on file  Other Topics Concern  . Not on file  Social History Narrative  . Not on file    Outpatient Encounter Medications as of 01/18/2018  Medication Sig  . acetaminophen (TYLENOL) 500 MG tablet Take 1,000 mg by mouth every 8 (eight) hours as needed for mild pain or moderate pain.  Marland Kitchen diltiazem (CARDIZEM CD) 240 MG 24 hr capsule TAKE 1 CAPSULE BY MOUTH EVERY DAY  . fluticasone (FLONASE) 50 MCG/ACT nasal spray USE 2 SPRAYS EACH NOSTRIL EVERY DAY  . lisinopril (PRINIVIL,ZESTRIL) 40 MG tablet TAKE 1 TABLET BY MOUTH DAILY  . Multiple Vitamin (MULTIVITAMIN WITH MINERALS) TABS tablet Take 1 tablet by mouth daily.  Marland Kitchen omeprazole (PRILOSEC) 20 MG capsule Take 1 capsule (20 mg total) by mouth daily.  Marland Kitchen omeprazole (PRILOSEC) 40 MG  capsule Take 1 capsule (40 mg total) by mouth daily.   No facility-administered encounter medications on file as of 01/18/2018.     No Known Allergies  Review of Systems  Constitutional: Negative for fever and malaise/fatigue.  Eyes: Negative.   Respiratory: Negative for cough and shortness of breath.   Cardiovascular: Negative for chest pain, palpitations and orthopnea.  Gastrointestinal: Positive for abdominal pain.  Genitourinary: Negative.   Musculoskeletal: Negative for back pain, joint pain and myalgias.  Neurological: Negative.   Endo/Heme/Allergies: Negative.   Psychiatric/Behavioral: Negative.     Objective:  There were no vitals taken for this visit.  Physical Exam  Constitutional: He is oriented to person, place, and time and well-developed, well-nourished, and in no distress.  HENT:  Head: Normocephalic and atraumatic.  Eyes: Conjunctivae are normal. No scleral icterus.  Neck: No thyromegaly present.  Cardiovascular: Normal rate, regular rhythm and normal heart sounds.  Pulmonary/Chest: Effort normal and breath sounds normal.  Abdominal: Soft.  Musculoskeletal: He exhibits no edema.  Neurological: He is alert and oriented to person, place, and time. Gait normal. GCS score is 15.  Skin: Skin is warm and dry.  Psychiatric: Mood, memory, affect and judgment normal.    Assessment and Plan :  1. Gastritis without bleeding, unspecified chronicity, unspecified gastritis type  - omeprazole (PRILOSEC) 40 MG capsule; Take 1 capsule (40 mg total) by mouth daily.  Dispense: 30 capsule; Refill: 11 2.OA Avoid NSAIDs. Try tumeric daily and topical capsaicin.  I have done the exam and reviewed the chart and it is accurate to the best of my knowledge. Development worker, community has been used and  any errors in dictation or transcription are unintentional. Miguel Aschoff M.D. [facility]

## 2018-01-18 NOTE — Patient Instructions (Signed)
Tylenol as needed for pain. Over the counter Turmeric for arthitic symptoms

## 2018-01-26 ENCOUNTER — Ambulatory Visit: Payer: Self-pay | Admitting: Family Medicine

## 2018-01-29 ENCOUNTER — Ambulatory Visit (INDEPENDENT_AMBULATORY_CARE_PROVIDER_SITE_OTHER): Payer: 59 | Admitting: Urology

## 2018-01-29 ENCOUNTER — Encounter: Payer: Self-pay | Admitting: Urology

## 2018-01-29 ENCOUNTER — Other Ambulatory Visit: Payer: Self-pay

## 2018-01-29 VITALS — BP 161/92 | HR 55 | Ht 71.0 in | Wt 213.5 lb

## 2018-01-29 DIAGNOSIS — N401 Enlarged prostate with lower urinary tract symptoms: Secondary | ICD-10-CM

## 2018-01-29 DIAGNOSIS — R972 Elevated prostate specific antigen [PSA]: Secondary | ICD-10-CM

## 2018-01-29 DIAGNOSIS — N3943 Post-void dribbling: Secondary | ICD-10-CM

## 2018-01-29 NOTE — Patient Instructions (Signed)

## 2018-01-29 NOTE — Progress Notes (Signed)
01/29/2018 10:51 AM   [name] April 13, 1956 [id number]  Referring provider: [name], MD [address] [place], [place] [address]  Chief Complaint  Patient presents with  . Elevated PSA    HPI: 62 year old AA male referred by Dr. Rosanna Randy for elevated PSA.  He had a series of PSAs drawn over the past year and a half.  His PSA initially was 3.8 on 06/2016.  It since rose to 4.3 on 08/2017 and subsequently 6.1 and 12/2017.  He does have some mild baseline urinary symptoms including nocturia 0-1x, urgency and frequency exacerbated with activity and running water.  He is otherwise satisfied with this voiding.  He denies a weak stream.  He feels like he empties his bladder completely.  He does complain of occasional postvoid dribbling.  No gross hematuria or UTIs.      No family history of prostate cancer.  Most recent urinalysis on 01/06/2018 negative other than moderate blood on dipstick but none on microscopic examination.  He is an avid bike rider.  He rides 15 to 20 miles multiple times a week.  He wonders if this is contributing to his rising PSA.  He denies any perineal or pelvic pain.  He last rode his bike 2 days ago.  No weight loss or bone pain.  PMH: Past Medical History:  Diagnosis Date  . Arthritis   . Hypertension     Surgical History: Past Surgical History:  Procedure Laterality Date  . APPENDECTOMY     At age 49  . HEMORROIDECTOMY  1930  . QUADRICEPS TENDON REPAIR Left 09/09/2017   Procedure: REPAIR QUADRICEP TENDON;  Surgeon: Earnestine Leys, MD;  Location: [facility] ORS;  Service: Orthopedics;  Laterality: Left;    Home Medications:  Allergies as of 01/29/2018   No Known Allergies     Medication List        Accurate as of 01/29/18 10:51 AM. Always use your most recent med list.          acetaminophen 500 MG tablet Commonly known as:  TYLENOL Take 1,000 mg by mouth every 8 (eight) hours as needed for mild pain or moderate pain.   diltiazem 240 MG 24 hr capsule Commonly known as:  CARDIZEM CD TAKE 1 CAPSULE BY MOUTH EVERY DAY   fluticasone 50 MCG/ACT nasal spray Commonly known as:  FLONASE USE 2 SPRAYS EACH NOSTRIL EVERY DAY   lisinopril 40 MG tablet Commonly known as:  PRINIVIL,ZESTRIL TAKE 1 TABLET BY MOUTH DAILY   multivitamin with minerals Tabs tablet Take 1 tablet by mouth daily.   omeprazole 20 MG capsule Commonly known as:  PRILOSEC Take 1 capsule (20 mg total) by mouth daily.   omeprazole 40 MG capsule Commonly known as:  PRILOSEC Take 1 capsule (40 mg total) by mouth daily.       Allergies: No Known Allergies  Family History: Family History  Problem Relation Age of Onset  . Heart attack Mother   . Stroke Father   . Asthma Father   . Cancer Brother   . Diabetes Brother   . Prostate cancer Neg Hx   . Kidney cancer Neg Hx   . Bladder Cancer Neg Hx     Social History:  reports that he has been smoking cigars. He has never used smokeless tobacco. He reports that he drinks alcohol. He reports that he does not use drugs.  ROS: UROLOGY Frequent Urination?: Yes Hard to postpone urination?: Yes Burning/pain with urination?:  No Get up at night to urinate?: Yes Leakage of urine?: Yes Urine stream starts and stops?: No Trouble starting stream?: No Do you have to strain to urinate?: No Blood in urine?: No Urinary tract infection?: No Sexually transmitted disease?: No Injury to kidneys or bladder?: No Painful intercourse?: No Weak stream?: No Erection problems?: No Penile pain?: No  Gastrointestinal Nausea?: No Vomiting?: No Indigestion/heartburn?: No Diarrhea?: No Constipation?: No  Constitutional Fever: No Night sweats?: No Weight loss?: No Fatigue?: No  Skin Skin rash/lesions?: No Itching?: Yes  Eyes Blurred vision?: No Double vision?: No  Ears/Nose/Throat Sore throat?: No Sinus problems?: Yes  Hematologic/Lymphatic Swollen glands?: No Easy bruising?:  No  Cardiovascular Leg swelling?: No Chest pain?: No  Respiratory Cough?: No Shortness of breath?: No  Endocrine Excessive thirst?: No  Musculoskeletal Back pain?: Yes Joint pain?: Yes  Neurological Headaches?: No Dizziness?: No  Psychologic Depression?: No Anxiety?: No  Physical Exam: BP (!) 161/92   Pulse (!) 55   Ht 5\' 11"  (1.803 m)   Wt 213 lb 8 oz (96.8 kg)   BMI 29.78 kg/m   Constitutional:  Alert and oriented, No acute distress. HEENT: [place] AT, moist mucus membranes.  Trachea midline, no masses. Cardiovascular: No clubbing, cyanosis, or edema. Respiratory: Normal respiratory effort, no increased work of breathing. GI: Abdomen is soft, nontender, nondistended, no abdominal masses GU: No CVA tenderness Rectal: Normal sphincter tone.  50 cc prostate, rubbery, nonnodular, nontender. Skin: No rashes, bruises or suspicious lesions. Neurologic: Grossly intact, no focal deficits, moving all 4 extremities. Psychiatric: Normal mood and affect.  Laboratory Data: Lab Results  Component Value Date   WBC 3.9 01/06/2018   HGB 13.3 01/06/2018   HCT 39.2 (L) 01/06/2018   MCV 84.7 01/06/2018   PLT 167 01/06/2018    Lab Results  Component Value Date   CREATININE 0.92 01/06/2018    Lab Results  Component Value Date   PSA 2.7 07/06/2014    Lab Results  Component Value Date   HGBA1C 5.7 (H) 09/04/2017    Urinalysis    Component Value Date/Time   COLORURINE COLORLESS (A) 01/06/2018 0011   APPEARANCEUR CLEAR (A) 01/06/2018 0011   LABSPEC 1.003 (L) 01/06/2018 0011   PHURINE 7.0 01/06/2018 0011   GLUCOSEU NEGATIVE 01/06/2018 0011   HGBUR MODERATE (A) 01/06/2018 0011   BILIRUBINUR NEGATIVE 01/06/2018 0011   BILIRUBINUR negative 09/04/2017 1505   KETONESUR NEGATIVE 01/06/2018 0011   PROTEINUR NEGATIVE 01/06/2018 0011   UROBILINOGEN 0.2 09/04/2017 1505   NITRITE NEGATIVE 01/06/2018 0011   LEUKOCYTESUR NEGATIVE 01/06/2018 0011    Lab Results  Component  Value Date   BACTERIA NONE SEEN 01/06/2018    Pertinent Imaging: CT abdomen pelvis with contrast from 01/06/2018 was reviewed.  The prostate does appear to be mildly enlarged without significant intravesical protrusion or bladder wall thickening appreciated.  No lymphadenopathy is appreciated.  Assessment & Plan:    1. Elevated PSA  We reviewed the implications of an elevated PSA and the uncertainty surrounding it. In general, a man's PSA increases with age and is produced by both normal and cancerous prostate tissue. Differential for elevated PSA is BPH, prostate cancer, infection, recent intercourse/ejaculation, prostate infarction, recent urethroscopic manipulation (foley placement/cystoscopy) and prostatitis. Management of an elevated PSA can include observation or prostate biopsy and wediscussed this in detail.  We discussed that indications for prostate biopsy are defined by age and race specific PSA cutoffs as well as a PSA velocity of 0.75/year.  We did  discuss if his PSA remains elevated or continues to rise, would strongly recommend prostate biopsy.  We discussed prostate biopsy in detail including the procedure itself, the risks of blood in the urine, stool, and ejaculate, serious infection, and discomfort. He is willing to proceed with this as discussed.  We will call him early next week with his PSA results and recommendations.  He is agreeable to proceed with prostate biopsy if this is recommended.  - PSA  2. Benign prostatic hyperplasia with post-void dribbling Mild urinary symptoms associated with prostamegaly Overall satisfied with voiding thus no role for pharmacotherapy at this time   Return for Will call with PSA results and plan.  Hollice Espy, MD  [facility] [address] [place], [place] [address] [phone]

## 2018-01-30 LAB — PSA: Prostate Specific Ag, Serum: 5 ng/mL — ABNORMAL HIGH (ref 0.0–4.0)

## 2018-02-01 ENCOUNTER — Telehealth: Payer: Self-pay

## 2018-02-01 DIAGNOSIS — R972 Elevated prostate specific antigen [PSA]: Secondary | ICD-10-CM

## 2018-02-01 NOTE — Telephone Encounter (Signed)
-----   Message from Hollice Espy, MD sent at 01/31/2018  2:09 PM EDT ----- PSA is coming down.  Rechecked value was 5.0.  This is still elevated for your age.  Given that this is trending downwards, I like to keep a close eye on this.  I would like to see you again in 3 months with a PSA prior to your visit, 1 to 2 days before.  Lets hold off on biopsy and see what happens with your trend.  As discussed, hold off on biking several days before having your PSA drawn.  Hollice Espy, MD

## 2018-02-01 NOTE — Telephone Encounter (Signed)
Patient notified and scheduled for 3 month, orders placed

## 2018-03-20 ENCOUNTER — Other Ambulatory Visit: Payer: Self-pay | Admitting: Family Medicine

## 2018-03-20 DIAGNOSIS — J309 Allergic rhinitis, unspecified: Secondary | ICD-10-CM

## 2018-03-25 ENCOUNTER — Encounter: Payer: Self-pay | Admitting: Family Medicine

## 2018-03-25 ENCOUNTER — Other Ambulatory Visit: Payer: Self-pay

## 2018-03-25 ENCOUNTER — Ambulatory Visit: Payer: 59 | Admitting: Family Medicine

## 2018-03-25 VITALS — BP 144/92 | HR 56 | Temp 97.9°F | Ht 71.0 in | Wt 219.4 lb

## 2018-03-25 DIAGNOSIS — J301 Allergic rhinitis due to pollen: Secondary | ICD-10-CM

## 2018-03-25 DIAGNOSIS — J0100 Acute maxillary sinusitis, unspecified: Secondary | ICD-10-CM

## 2018-03-25 MED ORDER — AMOXICILLIN 500 MG PO CAPS: 500.0000 mg | ORAL_CAPSULE | Freq: Three times a day (TID) | ORAL | 0 refills | Status: DC

## 2018-03-25 MED ORDER — PREDNISONE 5 MG PO TABS: ORAL_TABLET | ORAL | 0 refills | Status: DC

## 2018-03-25 NOTE — Progress Notes (Signed)
Patient: Jack Weaver Male    DOB: 09-23-1955   62 y.o.   MRN: [id number] Visit Date: 03/25/2018  Today's Provider: Vernie Murders, PA   Chief Complaint  Patient presents with  . Sinusitis    2 weeks 03/11/18, drainage, cough, sinus pressure and pain   Subjective:    HPI  Pt reports that he has a sinus infection and been going on for about 2 weeks (03/11/18) with drainage in throat with dark yellowish green mucus, cough, sinus pressure.  He has used Flonase that helps some but doesn't last long.     Past Medical History:  Diagnosis Date  . Arthritis   . Hypertension    Past Surgical History:  Procedure Laterality Date  . APPENDECTOMY     At age 29  . HEMORROIDECTOMY  1930  . QUADRICEPS TENDON REPAIR Left 09/09/2017   Procedure: REPAIR QUADRICEP TENDON;  Surgeon: Earnestine Leys, MD;  Location: [facility] ORS;  Service: Orthopedics;  Laterality: Left;   Family History  Problem Relation Age of Onset  . Heart attack Mother   . Stroke Father   . Asthma Father   . Cancer Brother   . Diabetes Brother   . Prostate cancer Neg Hx   . Kidney cancer Neg Hx   . Bladder Cancer Neg Hx    No Known Allergies   Current Outpatient Medications:  .  acetaminophen (TYLENOL) 500 MG tablet, Take 1,000 mg by mouth every 8 (eight) hours as needed for mild pain or moderate pain., Disp: , Rfl:  .  diltiazem (CARDIZEM CD) 240 MG 24 hr capsule, TAKE 1 CAPSULE BY MOUTH EVERY DAY, Disp: 90 capsule, Rfl: 3 .  fluticasone (FLONASE) 50 MCG/ACT nasal spray, USE 2 SPRAYS EACH NOSTRIL EVERY DAY, Disp: 48 g, Rfl: 3 .  lisinopril (PRINIVIL,ZESTRIL) 40 MG tablet, TAKE 1 TABLET BY MOUTH DAILY, Disp: 30 tablet, Rfl: 11 .  Multiple Vitamin (MULTIVITAMIN WITH MINERALS) TABS tablet, Take 1 tablet by mouth daily., Disp: , Rfl:  .  omeprazole (PRILOSEC) 40 MG capsule, Take 1 capsule (40 mg total) by mouth daily., Disp: 30 capsule, Rfl: 11 .  omeprazole (PRILOSEC) 20 MG capsule, Take 1 capsule (20 mg total) by  mouth daily. (Patient not taking: Reported on 03/25/2018), Disp: 30 capsule, Rfl: 3  Review of Systems  Constitutional: Negative.   HENT: Positive for sinus pressure and sinus pain. Negative for congestion, dental problem, drooling, ear discharge, ear pain, facial swelling, hearing loss, mouth sores, nosebleeds, rhinorrhea, sneezing, sore throat, tinnitus, trouble swallowing and voice change. Postnasal drip: alot of yellowish green drainage.   Eyes: Negative.   Respiratory: Positive for cough. Negative for apnea, choking, chest tightness, shortness of breath, wheezing and stridor.   Cardiovascular: Negative.   Gastrointestinal: Negative.   Endocrine: Negative.   Genitourinary: Negative.   Musculoskeletal: Negative.   Skin: Negative.   Allergic/Immunologic: Negative.   Neurological: Negative.   Hematological: Negative.   Psychiatric/Behavioral: Negative.    Social History   Tobacco Use  . Smoking status: Current Some Day Smoker    Types: Cigars  . Smokeless tobacco: Never Used  . Tobacco comment: quit in 06/2013. Pt recently started 1 cigarette daily. Now smokes once a week.  Substance Use Topics  . Alcohol use: Yes    Alcohol/week: 0.0 standard drinks    Comment: Wine 5x week   Objective:   BP (!) 144/92 (BP Location: Right Arm, Patient Position: Sitting, Cuff Size: Normal)  Pulse (!) 56   Temp 97.9 F (36.6 C) (Oral)   Ht 5\' 11"  (1.803 m)   Wt 219 lb 6.4 oz (99.5 kg)   SpO2 98%   BMI 30.60 kg/m  Vitals:   03/25/18 1326  BP: (!) 144/92  Pulse: (!) 56  Temp: 97.9 F (36.6 C)  TempSrc: Oral  SpO2: 98%  Weight: 219 lb 6.4 oz (99.5 kg)  Height: 5\' 11"  (1.803 m)   Physical Exam  Constitutional: He is oriented to person, place, and time. He appears well-developed and well-nourished. No distress.  HENT:  Head: Normocephalic and atraumatic.  Right Ear: Hearing and external ear normal.  Left Ear: Hearing and external ear normal.  Nose: Nose normal.  No  transillumination of maxillary sinuses without tenderness. Nasal membranes pink and slightly swollen on the right. No bleeding.  Eyes: Conjunctivae and lids are normal. Right eye exhibits no discharge. Left eye exhibits no discharge. No scleral icterus.  Cardiovascular: Normal rate and regular rhythm.  Pulmonary/Chest: Effort normal. No respiratory distress.  Musculoskeletal: Normal range of motion.  Neurological: He is alert and oriented to person, place, and time.  Skin: Skin is intact. No lesion and no rash noted.  Psychiatric: He has a normal mood and affect. His speech is normal and behavior is normal. Thought content normal.      Assessment & Plan:     1. Subacute maxillary sinusitis Onset the past couple months with decrease in his sense of smell and taste. No fever but having a great deal of PND with yellowish sputum with cough. Symptoms worse at night. Will treat with Mucinex-DM for cough, antihistamine for PND with rhinorrhea, add prednisone taper for hypogeusia with anosmia and allergic rhinitis. Treat with Amoxil for bacterial component. Increase fluid intake and recheck prn. - amoxicillin (AMOXIL) 500 MG capsule; Take 1 capsule (500 mg total) by mouth 3 (three) times daily.  Dispense: 30 capsule; Refill: 0 - predniSONE (DELTASONE) 5 MG tablet; Start with 6 tablets by mouth the first day then decrease by one tablet daily (6,5,4,3,2,1)  Dispense: 21 tablet; Refill: 0  2. Seasonal allergic rhinitis due to pollen Onset 2 months ago. Has been using Flonase with minimal relief. Add antihistamine (Claritin, Allegra or Zyrtec) for rhinorrhea and PND. Given Prednisone taper and should recheck if no better in 5-7 days. - predniSONE (DELTASONE) 5 MG tablet; Start with 6 tablets by mouth the first day then decrease by one tablet daily (6,5,4,3,2,1)  Dispense: 21 tablet; Refill: [place]  [facility]

## 2018-04-05 DIAGNOSIS — G5702 Lesion of sciatic nerve, left lower limb: Secondary | ICD-10-CM | POA: Diagnosis not present

## 2018-04-06 DIAGNOSIS — M47896 Other spondylosis, lumbar region: Secondary | ICD-10-CM | POA: Diagnosis not present

## 2018-04-20 ENCOUNTER — Ambulatory Visit: Payer: Self-pay | Admitting: Family Medicine

## 2018-05-03 ENCOUNTER — Other Ambulatory Visit: Payer: 59

## 2018-05-03 DIAGNOSIS — R972 Elevated prostate specific antigen [PSA]: Secondary | ICD-10-CM

## 2018-05-04 LAB — PSA: Prostate Specific Ag, Serum: 7.4 ng/mL — ABNORMAL HIGH (ref 0.0–4.0)

## 2018-05-11 ENCOUNTER — Encounter: Payer: Self-pay | Admitting: Urology

## 2018-05-11 ENCOUNTER — Ambulatory Visit: Payer: 59 | Admitting: Urology

## 2018-05-11 VITALS — BP 161/108 | HR 70 | Ht 71.0 in | Wt 221.0 lb

## 2018-05-11 DIAGNOSIS — R972 Elevated prostate specific antigen [PSA]: Secondary | ICD-10-CM | POA: Diagnosis not present

## 2018-05-11 NOTE — Patient Instructions (Signed)

## 2018-05-11 NOTE — Progress Notes (Signed)
05/11/2018 10:25 AM   [name] [birth year] [id number]  Referring provider: [name], MD [address] [place], [place] [address]  Chief Complaint  Patient presents with  . Elevated PSA    3 month follow up    HPI: 62 year old male with personal history of elevated PSA returns today for one-month follow-up.  Rectal exam performed on 01/2018 showed prostamegaly with a rubbery nonnodular prostate.  Notably, he is an avid biker.  Minimal urinary symptoms.  Risk factors for prostate cancer include age and ethnicity.  No family history.   PSA trend: 3.8 06/2016 4.3 08/2017 6.1 12/2017 5.0 01/2018 7.412/2019  PMH: Past Medical History:  Diagnosis Date  . Arthritis   . Hypertension     Surgical History: Past Surgical History:  Procedure Laterality Date  . APPENDECTOMY     At age 33  . HEMORROIDECTOMY  1930  . QUADRICEPS TENDON REPAIR Left 09/09/2017   Procedure: REPAIR QUADRICEP TENDON;  Surgeon: Earnestine Leys, MD;  Location: [facility] ORS;  Service: Orthopedics;  Laterality: Left;    Home Medications:  Allergies as of 05/11/2018   No Known Allergies     Medication List       Accurate as of May 11, 2018 10:25 AM. Always use your most recent med list.        acetaminophen 500 MG tablet Commonly known as:  TYLENOL Take 1,000 mg by mouth every 8 (eight) hours as needed for mild pain or moderate pain.   diltiazem 240 MG 24 hr capsule Commonly known as:  CARDIZEM CD TAKE 1 CAPSULE BY MOUTH EVERY DAY   fluticasone 50 MCG/ACT nasal spray Commonly known as:  FLONASE USE 2 SPRAYS EACH NOSTRIL EVERY DAY   lisinopril 40 MG tablet Commonly known as:  PRINIVIL,ZESTRIL TAKE 1 TABLET BY MOUTH DAILY   multivitamin with minerals Tabs tablet Take 1 tablet by mouth daily.   omeprazole 20 MG capsule Commonly known as:  PRILOSEC Take 1 capsule (20 mg total) by mouth daily.   omeprazole 40 MG capsule Commonly known as:  PRILOSEC Take  1 capsule (40 mg total) by mouth daily.       Allergies: No Known Allergies  Family History: Family History  Problem Relation Age of Onset  . Heart attack Mother   . Stroke Father   . Asthma Father   . Cancer Brother   . Diabetes Brother   . Prostate cancer Neg Hx   . Kidney cancer Neg Hx   . Bladder Cancer Neg Hx     Social History:  reports that he has been smoking cigars. He has never used smokeless tobacco. He reports current alcohol use. He reports that he does not use drugs.  ROS: UROLOGY Frequent Urination?: No Hard to postpone urination?: Yes Burning/pain with urination?: No Get up at night to urinate?: No Leakage of urine?: Yes Urine stream starts and stops?: No Trouble starting stream?: No Do you have to strain to urinate?: No Blood in urine?: No Urinary tract infection?: No Sexually transmitted disease?: No Injury to kidneys or bladder?: No Painful intercourse?: No Weak stream?: Yes Erection problems?: Yes Penile pain?: No  Gastrointestinal Nausea?: No Vomiting?: No Indigestion/heartburn?: No Diarrhea?: No Constipation?: No  Constitutional Fever: No Night sweats?: No Weight loss?: No Fatigue?: No  Skin Skin rash/lesions?: No Itching?: No  Eyes Blurred vision?: No Double vision?: No  Ears/Nose/Throat Sore throat?: No Sinus problems?: Yes  Hematologic/Lymphatic Swollen glands?: No Easy bruising?: No  Cardiovascular Leg swelling?: No Chest pain?: No  Respiratory Cough?: No Shortness of breath?: No  Endocrine Excessive thirst?: No  Musculoskeletal Back pain?: No Joint pain?: Yes  Neurological Headaches?: No Dizziness?: No  Psychologic Depression?: No Anxiety?: No  Physical Exam: BP (!) 161/108   Pulse 70   Ht 5\' 11"  (1.803 m)   Wt 221 lb (100.2 kg)   BMI 30.82 kg/m   Constitutional:  Alert and oriented, No acute distress.  Anxious today. HEENT: [place] AT, moist mucus membranes.  Trachea midline, no  masses. Cardiovascular: No clubbing, cyanosis, or edema. Respiratory: Normal respiratory effort, no increased work of breathing. Skin: No rashes, bruises or suspicious lesions. Neurologic: Grossly intact, no focal deficits, moving all 4 extremities. Psychiatric: Normal mood and affect.  Laboratory Data: PSA trend as above, labs reviewed  Assessment & Plan:    1. Rising PSA level PSA continues to rise, overall trend upwards with some fluctuation  At this point, I do feel that is reasonable to pursue prostate biopsy.  Risk and benefits were discussed in detail.  We discussed prostate biopsy in detail including the procedure itself, the risks of blood in the urine, stool, and ejaculate, serious infection, and discomfort. He is willing to proceed with this as discussed.  He is tearful and anxious today in clinic about the possibility of prostate cancer.  Return for schedule prostate biopsy, next availible.  Hollice Espy, MD  [facility] [address] [place],  [address] [phone]  I spent 15 min with this patient of which greater than 50% was spent in counseling and coordination of care with the patient.

## 2018-06-05 ENCOUNTER — Other Ambulatory Visit: Payer: Self-pay | Admitting: Family Medicine

## 2018-06-23 DIAGNOSIS — M7071 Other bursitis of hip, right hip: Secondary | ICD-10-CM | POA: Diagnosis not present

## 2018-06-27 ENCOUNTER — Emergency Department: Payer: 59

## 2018-06-27 ENCOUNTER — Emergency Department
Admission: EM | Admit: 2018-06-27 | Discharge: 2018-06-27 | Disposition: A | Discharge status: Home / Self Care | Payer: 59 | Attending: Emergency Medicine | Admitting: Emergency Medicine

## 2018-06-27 ENCOUNTER — Other Ambulatory Visit: Payer: Self-pay

## 2018-06-27 DIAGNOSIS — S79921A Unspecified injury of right thigh, initial encounter: Secondary | ICD-10-CM | POA: Diagnosis present

## 2018-06-27 DIAGNOSIS — Y929 Unspecified place or not applicable: Secondary | ICD-10-CM | POA: Diagnosis not present

## 2018-06-27 DIAGNOSIS — Z79899 Other long term (current) drug therapy: Secondary | ICD-10-CM | POA: Diagnosis not present

## 2018-06-27 DIAGNOSIS — Y998 Other external cause status: Secondary | ICD-10-CM | POA: Diagnosis not present

## 2018-06-27 DIAGNOSIS — X500XXA Overexertion from strenuous movement or load, initial encounter: Secondary | ICD-10-CM | POA: Diagnosis not present

## 2018-06-27 DIAGNOSIS — S76211A Strain of adductor muscle, fascia and tendon of right thigh, initial encounter: Secondary | ICD-10-CM | POA: Diagnosis not present

## 2018-06-27 DIAGNOSIS — F17290 Nicotine dependence, other tobacco product, uncomplicated: Secondary | ICD-10-CM | POA: Insufficient documentation

## 2018-06-27 DIAGNOSIS — Y9302 Activity, running: Secondary | ICD-10-CM | POA: Diagnosis not present

## 2018-06-27 DIAGNOSIS — R1031 Right lower quadrant pain: Secondary | ICD-10-CM

## 2018-06-27 DIAGNOSIS — S76811A Strain of other specified muscles, fascia and tendons at thigh level, right thigh, initial encounter: Secondary | ICD-10-CM | POA: Diagnosis not present

## 2018-06-27 DIAGNOSIS — I10 Essential (primary) hypertension: Secondary | ICD-10-CM | POA: Insufficient documentation

## 2018-06-27 MED ORDER — DICLOFENAC SODIUM 1 % TD GEL: TRANSDERMAL | 0 refills | Status: DC

## 2018-06-27 MED ORDER — CARISOPRODOL 350 MG PO TABS: 350.0000 mg | ORAL_TABLET | Freq: Three times a day (TID) | ORAL | 0 refills | Status: DC | PRN

## 2018-06-27 NOTE — ED Notes (Signed)
Dr. schaevitz at bedside 

## 2018-06-27 NOTE — ED Provider Notes (Signed)
[facility] Emergency Department Provider Note  ____________________________________________   First MD Initiated Contact with Patient 06/27/18 0915     (approximate)  I have reviewed the triage vital signs and the nursing notes.   HISTORY  Chief Complaint groin pain   HPI Jack Weaver is a 63 y.o. male with a history of arthritis and hypertension was presenting with right groin pain over the past week.  He says that he does a brisk walk to a sprint as a workout and "broke 1 of my records" approximately 1 week ago while doing this workout faster than normal.  He says that after he had completed a workout he started having pain radiating from under scrotum up to his right groin.  He says that the pain is worsened with specific movements and he is become afraid to walk with his normal gait.  He says that he went to the orthopedic urgent care earlier this week and was prescribed tramadol as well as a steroid taper and discharged home with a diagnosis of a bursitis and lumbar sprain.  He says that he felt that the steroids were helping but last night he says that he had an episode of severe pain and presented back to the emergency department because of this.  He is denying any burning with urination.  No blood in his urine.  Patient denies having diabetes.  Denies any difficulty with eating or stooling.  No nausea or vomiting.   Past Medical History:  Diagnosis Date  . Arthritis   . Hypertension     Patient Active Problem List   Diagnosis Date Noted  . Lumbar sprain 09/04/2017  . Traumatic rupture of quadriceps tendon 09/02/2017  . Bursitis of hip 05/31/2015  . ED (erectile dysfunction) of organic origin 11/18/2014  . BP (high blood pressure) 11/18/2014  . Neuropathy 11/18/2014  . Allergic rhinitis 11/14/2014  . Arthropathia 11/26/2009  . History of tobacco use 07/31/2008  . Acid reflux 07/26/2008  . Arthritis, degenerative 07/26/2008  . Apnea, sleep  06/30/2008    Past Surgical History:  Procedure Laterality Date  . APPENDECTOMY     At age 42  . HEMORROIDECTOMY  1930  . QUADRICEPS TENDON REPAIR Left 09/09/2017   Procedure: REPAIR QUADRICEP TENDON;  Surgeon: Earnestine Leys, MD;  Location: [facility] ORS;  Service: Orthopedics;  Laterality: Left;    Prior to Admission medications   Medication Sig Start Date End Date Taking? Authorizing Provider  acetaminophen (TYLENOL) 500 MG tablet Take 1,000 mg by mouth every 8 (eight) hours as needed for mild pain or moderate pain.    [provider]  diltiazem (CARDIZEM CD) 240 MG 24 hr capsule TAKE 1 CAPSULE BY MOUTH EVERY DAY 08/25/17   [name], MD  fluticasone [facility]) 50 MCG/ACT nasal spray USE 2 SPRAYS EACH NOSTRIL EVERY DAY 03/21/18   [name], MD  lisinopril (PRINIVIL,ZESTRIL) 40 MG tablet TAKE 1 TABLET BY MOUTH DAILY 06/05/18   [name], MD  Multiple Vitamin (MULTIVITAMIN WITH MINERALS) TABS tablet Take 1 tablet by mouth daily.    [provider]  omeprazole (PRILOSEC) 20 MG capsule Take 1 capsule (20 mg total) by mouth daily. 01/05/18   [name], MD  omeprazole (PRILOSEC) 40 MG capsule Take 1 capsule (40 mg total) by mouth daily. 01/18/18 01/18/19  [name], MD    Allergies Patient has no known allergies.  Family History  Problem Relation Age  of Onset  . Heart attack Mother   . Stroke Father   . Asthma Father   . Cancer Brother   . Diabetes Brother   . Prostate cancer Neg Hx   . Kidney cancer Neg Hx   . Bladder Cancer Neg Hx     Social History Social History   Tobacco Use  . Smoking status: Current Some Day Smoker    Types: Cigars  . Smokeless tobacco: Never Used  . Tobacco comment: quit in 06/2013. Pt recently started 1 cigarette daily. Now smokes once a week.  Substance Use Topics  . Alcohol use: Yes    Alcohol/week: 0.0 standard drinks    Comment: Wine 5x week  . Drug use: No    Review of  Systems  Constitutional: No fever/chills Eyes: No visual changes. ENT: No sore throat. Cardiovascular: Denies chest pain. Respiratory: Denies shortness of breath. Gastrointestinal: No abdominal pain.  No nausea, no vomiting.  No diarrhea.  No constipation. Genitourinary: Negative for dysuria. Musculoskeletal: Negative for back pain. Skin: Negative for rash. Neurological: Negative for headaches, focal weakness or numbness.   ____________________________________________   PHYSICAL EXAM:  VITAL SIGNS: ED Triage Vitals [06/27/18 0732]  Enc Vitals Group     BP (!) 161/84     Pulse Rate 67     Resp 18     Temp 98.3 F (36.8 C)     Temp Source Oral     SpO2 100 %     Weight 212 lb (96.2 kg)     Height 5\' 11"  (1.803 m)     Head Circumference      Peak Flow      Pain Score 5     Pain Loc      Pain Edu?      Excl. in [place]?     Constitutional: Alert and oriented. Well appearing and in no acute distress. Eyes: Conjunctivae are normal.  Head: Atraumatic. Nose: No congestion/rhinnorhea. Mouth/Throat: Mucous membranes are moist.  Neck: No stridor.   Cardiovascular: Normal rate, regular rhythm. Grossly normal heart sounds.   Respiratory: Normal respiratory effort.  No retractions. Lungs CTAB. Gastrointestinal: Soft and nontender. No distention.  Genitourinary: Normal gross examination in this uncircumcised male.  No testicular swelling, erythema.  On palpation of the testicles the patient is nontender.  No horizontal lie.  There is no hernia sac palpated to the scrotum, bilaterally.  This was done while the patient was standing.  I do not palpate any tenderness to the bilateral inguinal region nor is there any bulge/hernia when the patient is standing. Musculoskeletal: No lower extremity tenderness nor edema.  No joint effusions. Neurologic:  Normal speech and language. No gross focal neurologic deficits are appreciated. Skin:  Skin is warm, dry and intact. No rash  noted. Psychiatric: Mood and affect are normal. Speech and behavior are normal.  ____________________________________________   LABS (all labs ordered are listed, but only abnormal results are displayed)  Labs Reviewed - No data to display ____________________________________________  EKG   ____________________________________________  RADIOLOGY  Ultrasound of the scrotum with normal sonographic appearance of the bilateral testes.  Small right hydrocele.  No evidence of torsion.  Possible tiny right inguinal hernia. ____________________________________________   PROCEDURES  Procedure(s) performed:   Procedures  Critical Care performed:   ____________________________________________   INITIAL IMPRESSION / ASSESSMENT AND PLAN / ED COURSE  Pertinent labs & imaging results that were available during my care of the patient were reviewed by me and considered in  my medical decision making (see chart for details).  DDX: Groin strain, hernia, hydrocele, epididymitis, orchitis, bursitis, arthritis As part of my medical decision making, I reviewed the following data within the [facility] the note from urgent Ortho.  Patient at this time with what appears to be a musculoskeletal issue.  No tenderness or hernia palpated on exam.  Furthermore, I examined the perineum and there is no evidence of infection.  There is no crepitus, erythema, induration, exudate.  Patient says that his tramadol is only helping minimally.  I will change the patient to a muscle relaxer.  He will continue the steroid taper and he will also be prescribed diclofenac gel.  Has follow-up tomorrow with Dr. Sabra Heck orthopedics.  He is understanding the diagnosis well treatment and willing to comply. ____________________________________________   FINAL CLINICAL IMPRESSION(S) / ED DIAGNOSES  Final diagnoses:  Rt groin pain      NEW MEDICATIONS STARTED DURING THIS VISIT:  New  Prescriptions   No medications on file     Note:  This document was prepared using Dragon voice recognition software and may include unintentional dictation errors.     Orbie Pyo, MD 06/27/18 1027

## 2018-06-27 NOTE — ED Triage Notes (Signed)
Pt comes via POV from home with c/o right sided groin pain. Pt states this started Wednesday and it has improved a little but with movement.   Pt states no swelling, no tenderness to touch and no pain when urinating.  Pt states he does a lot of jogging and maybe on Sunday he may have overdone it.

## 2018-06-29 ENCOUNTER — Other Ambulatory Visit: Payer: Self-pay | Admitting: Urology

## 2018-06-29 ENCOUNTER — Encounter: Payer: Self-pay | Admitting: Urology

## 2018-06-29 ENCOUNTER — Ambulatory Visit: Payer: 59 | Admitting: Urology

## 2018-06-29 VITALS — BP 162/99 | HR 64 | Ht 71.0 in | Wt 212.0 lb

## 2018-06-29 DIAGNOSIS — R972 Elevated prostate specific antigen [PSA]: Secondary | ICD-10-CM | POA: Diagnosis not present

## 2018-06-29 DIAGNOSIS — C61 Malignant neoplasm of prostate: Secondary | ICD-10-CM | POA: Diagnosis not present

## 2018-06-29 DIAGNOSIS — K6289 Other specified diseases of anus and rectum: Secondary | ICD-10-CM

## 2018-06-29 MED ORDER — LEVOFLOXACIN 500 MG PO TABS
500.0000 mg | ORAL_TABLET | Freq: Once | ORAL | Status: AC
  Administered 2018-06-29: 500 mg via ORAL

## 2018-06-29 MED ORDER — TRAMADOL HCL 50 MG PO TABS: 50.0000 mg | ORAL_TABLET | Freq: Four times a day (QID) | ORAL | 0 refills | Status: DC | PRN

## 2018-06-29 MED ORDER — LORAZEPAM 0.5 MG PO TABS: 0.5000 mg | ORAL_TABLET | Freq: Three times a day (TID) | ORAL | 0 refills | Status: DC

## 2018-06-29 MED ORDER — GENTAMICIN SULFATE 40 MG/ML IJ SOLN
80.0000 mg | Freq: Once | INTRAMUSCULAR | Status: AC
  Administered 2018-06-29: 80 mg via INTRAMUSCULAR

## 2018-06-29 NOTE — Progress Notes (Signed)
° °  06/29/2018 8:31 AM  Jack Weaver [birth year] [id number]  HPI Jack Weaver is a Black or African American that presents today for a prostate biopsy.  He is an avid biker and recently overexerted himself resulting in a groin injury. His injury made it difficult for the patient to position himself properly for the biopsy, although, he elected to undergo biopsy regardless as he would rather get it out of the way now.   DRE on 01/2018 showed prostamegaly with a rubbery non-nodular prostate.   Minimal urinary symptoms. Risk  Factors for prostate cancer include age and ethnicity. He has no family history of prostate cancer  Last PSA was 7.4 on 05/03/2018, trend as below. He is an avid biker.   PSA trend 3.8  06/2016 4.3  08/2017 6.1  12/2017 5.0  01/2018 7.4  04/2018  Prostate Biopsy Procedure  Informed consent was obtained after discussing risks/benefits of the procedure.  A time out was performed to ensure correct patient identity.  Pre-Procedure: - Last PSA Level:  Lab Results  Component Value Date   PSA 2.7 07/06/2014  - Procedure complicated by patient's groin injury precluding optimal positioning - Gentamicin given prophylactically - Levaquin 500 mg administered PO -Transrectal Ultrasound performed revealing a 53.79 gm prostate -No significant hypoechoic or median lobe noted  Procedure: - Prostate block performed using 10 cc 1% lidocaine and biopsies taken from sextant areas, a total of 12 under ultrasound guidance.  Post-Procedure: - Patient had significant discomfort and procedure was not well tolerated secondary to pain as well as anxiety  Assessment and Plan: 1. Elevated PSA  - Counseled patient to seek immediate medical attention if  He experiences any severe pain, significant bleeding, or fevers  - Return in 2 weeks to discuss biopsy results  2. Rectal Pain  - Sent tramadol and atavan to pharmacy to aid in post procedure pain and anxiety  [facility] [address] [place], [place] [address] [phone]  I, Stephania Fragmin , am acting as a scribe for Hollice Espy, MD  I have reviewed the above documentation for accuracy and completeness, and I agree with the above.   Hollice Espy, MD

## 2018-07-02 LAB — PATHOLOGY REPORT

## 2018-07-05 ENCOUNTER — Other Ambulatory Visit: Payer: Self-pay | Admitting: Urology

## 2018-07-13 ENCOUNTER — Ambulatory Visit: Payer: 59 | Admitting: Urology

## 2018-07-13 ENCOUNTER — Encounter: Payer: Self-pay | Admitting: Urology

## 2018-07-13 VITALS — BP 157/99 | HR 85 | Ht 71.0 in | Wt 221.0 lb

## 2018-07-13 DIAGNOSIS — C61 Malignant neoplasm of prostate: Secondary | ICD-10-CM

## 2018-07-13 NOTE — Progress Notes (Signed)
07/13/2018 12:12 PM   Jack Weaver January 15, 1956 [id number]  Referring provider: [name], MD [address] [place], [place] [address]  Chief Complaint  Patient presents with  . Prostate Cancer    biopsy results    HPI: 63 year old male with history of elevated PSA who returns today following prostate biopsy to discuss his new diagnosis of prostate cancer.  Notably, he has been trending upward since 2018.  He ultimately elected to undergo prostate biopsy on 06/29/2017.  This shows intermediate risk prostate cancer, Gleason 4+3 involving a single core at the right base (73%), 3+4 at the right lateral base and right mid gland as well as 4 additional cores of Gleason 3+3 primarily on the right with a single core at the left apex.  Overall, he has 7 of 12 cores involved.  Rectal exam unremarkable, T1c.  TRUS vol 53.8 g  He has had an appendectomy (lap).  He has mild baseline erectile dysfunction, some difficulty maintaining erection but for the most part, minimal complaints.  Shim as below.  He has mild to moderate urinary symptoms, IPSS as below.  He does report that he still has a little bit of blood in his urine left over from biopsy.  This is improving.  No clots.  IPSS    Row Name 07/13/18 1100         International Prostate Symptom Score   How often have you had the sensation of not emptying your bladder?  More than half the time     How often have you had to urinate less than every two hours?  Less than half the time     How often have you found you stopped and started again several times when you urinated?  Not at All     How often have you found it difficult to postpone urination?  Less than half the time     How often have you had a weak urinary stream?  Not at All     How often have you had to strain to start urination?  Not at All     How many times did you typically get up at night to urinate?  1 Time     Total IPSS Score  9       Quality of  Life due to urinary symptoms   If you were to spend the rest of your life with your urinary condition just the way it is now how would you feel about that?  Mixed        Score:  1-7 Mild 8-19 Moderate 20-35 Severe  SHIM    Row Name 07/13/18 1134         SHIM: Over the last 6 months:   How do you rate your confidence that you could get and keep an erection?  Moderate     When you had erections with sexual stimulation, how often were your erections hard enough for penetration (entering your partner)?  Most Times (much more than half the time)     During sexual intercourse, how often were you able to maintain your erection after you had penetrated (entered) your partner?  Most Times (much more than half the time)     During sexual intercourse, how difficult was it to maintain your erection to completion of intercourse?  Slightly Difficult     When you attempted sexual intercourse, how often was it satisfactory for you?  Almost Always or Always  SHIM Total Score   SHIM  20        Score: 1-7 Severe ED 8-11 Moderate ED 12-16 Mild-Moderate ED 17-21 Mild ED 22-25 No ED    PMH: Past Medical History:  Diagnosis Date  . Arthritis   . Hypertension     Surgical History: Past Surgical History:  Procedure Laterality Date  . APPENDECTOMY     At age 41  . HEMORROIDECTOMY  1930  . QUADRICEPS TENDON REPAIR Left 09/09/2017   Procedure: REPAIR QUADRICEP TENDON;  Surgeon: Earnestine Leys, MD;  Location: [facility] ORS;  Service: Orthopedics;  Laterality: Left;    Home Medications:  Allergies as of 07/13/2018   No Known Allergies     Medication List       Accurate as of July 13, 2018 12:12 PM. Always use your most recent med list.        acetaminophen 500 MG tablet Commonly known as:  TYLENOL Take 1,000 mg by mouth every 8 (eight) hours as needed for mild pain or moderate pain.   carisoprodol 350 MG tablet Commonly known as:  SOMA Take 1 tablet (350 mg total) by mouth  3 (three) times daily as needed.   diclofenac sodium 1 % Gel Commonly known as:  VOLTAREN Use up to 4 times a day to the affected area as needed for pain.   diltiazem 240 MG 24 hr capsule Commonly known as:  CARDIZEM CD TAKE 1 CAPSULE BY MOUTH EVERY DAY   fluticasone 50 MCG/ACT nasal spray Commonly known as:  FLONASE USE 2 SPRAYS EACH NOSTRIL EVERY DAY   lisinopril 40 MG tablet Commonly known as:  PRINIVIL,ZESTRIL TAKE 1 TABLET BY MOUTH DAILY   LORazepam 0.5 MG tablet Commonly known as:  ATIVAN Take 1 tablet (0.5 mg total) by mouth every 8 (eight) hours.   multivitamin with minerals Tabs tablet Take 1 tablet by mouth daily.   omeprazole 40 MG capsule Commonly known as:  PRILOSEC Take 1 capsule (40 mg total) by mouth daily.   traMADol 50 MG tablet Commonly known as:  ULTRAM Take 1 tablet (50 mg total) by mouth every 6 (six) hours as needed.       Allergies: No Known Allergies  Family History: Family History  Problem Relation Age of Onset  . Heart attack Mother   . Stroke Father   . Asthma Father   . Cancer Brother   . Diabetes Brother   . Prostate cancer Neg Hx   . Kidney cancer Neg Hx   . Bladder Cancer Neg Hx     Social History:  reports that he has been smoking cigars. He has never used smokeless tobacco. He reports current alcohol use. He reports that he does not use drugs.  ROS: UROLOGY Frequent Urination?: No Hard to postpone urination?: No Burning/pain with urination?: No Get up at night to urinate?: No Leakage of urine?: No Urine stream starts and stops?: No Trouble starting stream?: No Do you have to strain to urinate?: No Blood in urine?: Yes Urinary tract infection?: No Sexually transmitted disease?: No Injury to kidneys or bladder?: No Painful intercourse?: No Weak stream?: No Erection problems?: No Penile pain?: No  Gastrointestinal Nausea?: No Vomiting?: No Indigestion/heartburn?: No Diarrhea?: No Constipation?:  No  Constitutional Fever: No Night sweats?: No Weight loss?: No Fatigue?: No  Skin Skin rash/lesions?: No Itching?: No  Eyes Blurred vision?: No Double vision?: No  Ears/Nose/Throat Sore throat?: No Sinus problems?: No  Hematologic/Lymphatic Swollen glands?: No Easy bruising?: No  Cardiovascular Leg swelling?: No Chest pain?: No  Respiratory Cough?: No Shortness of breath?: No  Endocrine Excessive thirst?: No  Musculoskeletal Back pain?: No Joint pain?: No  Neurological Headaches?: No Dizziness?: No  Psychologic Depression?: No Anxiety?: No  Physical Exam: BP (!) 157/99   Pulse 85   Ht 5\' 11"  (1.803 m)   Wt 221 lb (100.2 kg)   BMI 30.82 kg/m   Constitutional:  Alert and oriented, No acute distress.  Emotional today, tearful at times. HEENT: [place] AT, moist mucus membranes.  Trachea midline, no masses. Cardiovascular: No clubbing, cyanosis, or edema. Respiratory: Normal respiratory effort, no increased work of breathing. GI: Abdomen is soft, nontender, nondistended, no abdominal masses, periumbilical laparoscopic incision appreciated. Skin: No rashes, bruises or suspicious lesions. Neurologic: Grossly intact, no focal deficits, moving all 4 extremities. Psychiatric: Normal mood and affect.  Laboratory Data: Lab Results  Component Value Date   WBC 3.9 01/06/2018   HGB 13.3 01/06/2018   HCT 39.2 (L) 01/06/2018   MCV 84.7 01/06/2018   PLT 167 01/06/2018    Lab Results  Component Value Date   CREATININE 0.92 01/06/2018    Lab Results  Component Value Date   PSA 2.7 07/06/2014   Lab Results  Component Value Date   HGBA1C 5.7 (H) 09/04/2017    Assessment & Plan:    1. Prostate cancer [facility]) 63 year old male with newly diagnosed Gleason 4+3 prostate cancer, cT1c, primarily on the right.  He falls into the intermediate risk category per NCCN guidelines, unfavorable.  The patient was counseled about the natural history of prostate cancer  and the standard treatment options that are available for prostate cancer. It was explained to him how his age and life expectancy, clinical stage, Gleason score, and PSA affect his prognosis, the decision to proceed with additional staging studies, as well as how that information influences recommended treatment strategies. We discussed the roles for active surveillance, radiation therapy, surgical therapy, androgen deprivation, as well as ablative therapy options for the treatment of prostate cancer as appropriate to his individual cancer situation. We discussed the risks and benefits of these options with regard to their impact on cancer control and also in terms of potential adverse events, complications, and impact on quality of life particularly related to urinary, bowel, and sexual function. The patient was encouraged to ask questions throughout the discussion today and all questions were answered to his stated satisfaction. In addition, the patient was providedwith and/or directed to appropriate resources and literature for further education about prostate cancer treatment options.  We discussed surgical therapy for prostate cancer including the different available surgical approaches. We discussed, in detail, the risks and expectations of surgery with regard to cancer control, urinary control, and erectile dysfunction as well as expected post operative recovery process.. Additional risks of surgery including but not limalited to bleeding, infection, hernia formation, nerve damage, fistula formation, bowel/rect injury, potentially necessitating colostomy, damage to the urinary tract resulting in urinary leakage, urethral stricture, and cardiopulmonary risk such as myocardial infarction, stroke, death, thromboembolism etc. were explained. The risk of open surgical conversion for robotics/laparoscopic prostatectomy is also discussed.  Given his volume of disease and an unfavorable intermediate risk  category, I have ordered a CT pelvis to rule out any obvious metastatic disease to the lymph nodes primarily.  He is agreeable this plan.  He was seen radiation college he discussed this is an option.  We did go ahead and discussed that EBRT versus EBRT with brachytherapy boost  is often augmented with ADT for at least 6 months for this risk category of disease which improves overall survival.  We discussed the side effects of medication including hot flashes, weight gain, loss of muscle mass, central obesity, and long-term cardiac risk factors amongst others.  All questions were answered today.  He is very concerned about his erectile status.  He is also concerned about missing time at work and which option would allow him to continue work as this is financially important to him.  He is a Administrator.  Overall, the patient was very emotional today was unaccompanied.  I would like him to bring his wife with him to radiation oncology consul and then when he comes back review of CT scan as well as revisit surgical options with me, advised to bring his wife as well.  He is agreeable this plan.  - CT Pelvis W Contrast; Future - Ambulatory referral to Radiation Oncology   Return for f/u CT in 2-3 weeks after radiation oncology.  Hollice Espy, MD  [facility] [address], [place], [place] [address] [phone]  I spent 25 min with this patient of which greater than 50% was spent in counseling and coordination of care with the patient.

## 2018-07-22 ENCOUNTER — Other Ambulatory Visit: Payer: Self-pay

## 2018-07-22 ENCOUNTER — Ambulatory Visit
Admission: RE | Admit: 2018-07-22 | Discharge: 2018-07-22 | Disposition: A | Discharge status: Home / Self Care | Payer: 59 | Source: Ambulatory Visit | Attending: Radiation Oncology | Admitting: Radiation Oncology

## 2018-07-22 ENCOUNTER — Encounter: Payer: Self-pay | Admitting: Radiation Oncology

## 2018-07-22 VITALS — BP 156/96 | HR 64 | Temp 96.6°F | Resp 18 | Wt 218.5 lb

## 2018-07-22 DIAGNOSIS — Z79899 Other long term (current) drug therapy: Secondary | ICD-10-CM | POA: Insufficient documentation

## 2018-07-22 DIAGNOSIS — R351 Nocturia: Secondary | ICD-10-CM | POA: Insufficient documentation

## 2018-07-22 DIAGNOSIS — I10 Essential (primary) hypertension: Secondary | ICD-10-CM | POA: Insufficient documentation

## 2018-07-22 DIAGNOSIS — Z809 Family history of malignant neoplasm, unspecified: Secondary | ICD-10-CM | POA: Insufficient documentation

## 2018-07-22 DIAGNOSIS — R3915 Urgency of urination: Secondary | ICD-10-CM | POA: Diagnosis not present

## 2018-07-22 DIAGNOSIS — N529 Male erectile dysfunction, unspecified: Secondary | ICD-10-CM | POA: Diagnosis not present

## 2018-07-22 DIAGNOSIS — F17210 Nicotine dependence, cigarettes, uncomplicated: Secondary | ICD-10-CM | POA: Insufficient documentation

## 2018-07-22 DIAGNOSIS — M129 Arthropathy, unspecified: Secondary | ICD-10-CM | POA: Insufficient documentation

## 2018-07-22 DIAGNOSIS — C61 Malignant neoplasm of prostate: Secondary | ICD-10-CM | POA: Diagnosis present

## 2018-07-22 NOTE — Consult Note (Signed)
NEW PATIENT EVALUATION  Name: Jack Weaver  MRN: [id number]  Date:   07/22/2018     DOB: [birth year]   This 63 y.o. male patient presents to the clinic for initial evaluation of stage IIa (T1 CN 0 M0) mostly Gleason 7 (3+4) adenocarcinoma the prostate presenting the PSA of.7.4  REFERRING PHYSICIAN: [name],*  CHIEF COMPLAINT:  Chief Complaint  Patient presents with  . Prostate Cancer    Initial consultation     DIAGNOSIS: The encounter diagnosis was Malignant neoplasm of prostate ([place]).   PREVIOUS INVESTIGATIONS:  Pathology reports reviewed Clinical notes reviewed CT scan has been ordered of abdomen and pelvis  HPI: patient is a 63 year old male who is had an upward trending PSA oh since 2018 most recent PSA was 7.4. This prompt transrectal ultrasound-guided biopsy showing 7 of 12 cores positive for adenocarcinoma mostly Gleason 7 (3+4) located in the right lateral base and right mid gland. Also had some Gleason 6 as well as 1 core Gleason 7 (4+3). He does have some mild erectile dysfunction at this time. He is been seen by Dr. [name] and has been offered robotic prostatectomy is seen today for opinion regarding radiation. He does have some urgency of urination nocturia 1. CT scan has been ordered for next week.  PLANNED TREATMENT REGIMEN: robotic prostatectomy  PAST MEDICAL HISTORY:  has a past medical history of Arthritis and Hypertension.    PAST SURGICAL HISTORY:  Past Surgical History:  Procedure Laterality Date  . APPENDECTOMY     At age 44  . HEMORROIDECTOMY  1930  . QUADRICEPS TENDON REPAIR Left 09/09/2017   Procedure: REPAIR QUADRICEP TENDON;  Surgeon: Earnestine Leys, MD;  Location: [facility] ORS;  Service: Orthopedics;  Laterality: Left;    FAMILY HISTORY: family history includes Asthma in his father; Cancer in his brother; Diabetes in his brother; Heart attack in his mother; Stroke in his father.  SOCIAL HISTORY:  reports that he has been smoking  cigars. He has never used smokeless tobacco. He reports current alcohol use. He reports that he does not use drugs.  ALLERGIES: Patient has no known allergies.  MEDICATIONS:  Current Outpatient Medications  Medication Sig Dispense Refill  . acetaminophen (TYLENOL) 500 MG tablet Take 1,000 mg by mouth every 8 (eight) hours as needed for mild pain or moderate pain.    . carisoprodol (SOMA) 350 MG tablet Take 1 tablet (350 mg total) by mouth 3 (three) times daily as needed. 15 tablet 0  . diclofenac sodium (VOLTAREN) 1 % GEL Use up to 4 times a day to the affected area as needed for pain. 100 g 0  . diltiazem (CARDIZEM CD) 240 MG 24 hr capsule TAKE 1 CAPSULE BY MOUTH EVERY DAY 90 capsule 3  . fluticasone (FLONASE) 50 MCG/ACT nasal spray USE 2 SPRAYS EACH NOSTRIL EVERY DAY 48 g 3  . lisinopril (PRINIVIL,ZESTRIL) 40 MG tablet TAKE 1 TABLET BY MOUTH DAILY 90 tablet 3  . LORazepam (ATIVAN) 0.5 MG tablet Take 1 tablet (0.5 mg total) by mouth every 8 (eight) hours. 4 tablet 0  . Multiple Vitamin (MULTIVITAMIN WITH MINERALS) TABS tablet Take 1 tablet by mouth daily.    Marland Kitchen omeprazole (PRILOSEC) 40 MG capsule Take 1 capsule (40 mg total) by mouth daily. 30 capsule 11  . traMADol (ULTRAM) 50 MG tablet Take 1 tablet (50 mg total) by mouth every 6 (six) hours as needed. 4 tablet 0   No current facility-administered medications for this encounter.  ECOG PERFORMANCE STATUS:  0 - Asymptomatic  REVIEW OF SYSTEMS:  Patient denies any weight loss, fatigue, weakness, fever, chills or night sweats. Patient denies any loss of vision, blurred vision. Patient denies any ringing  of the ears or hearing loss. No irregular heartbeat. Patient denies heart murmur or history of fainting. Patient denies any chest pain or pain radiating to her upper extremities. Patient denies any shortness of breath, difficulty breathing at night, cough or hemoptysis. Patient denies any swelling in the lower legs. Patient denies any nausea  vomiting, vomiting of blood, or coffee ground material in the vomitus. Patient denies any stomach pain. Patient states has had normal bowel movements no significant constipation or diarrhea. Patient denies any dysuria, hematuria or significant nocturia. Patient denies any problems walking, swelling in the joints or loss of balance. Patient denies any skin changes, loss of hair or loss of weight. Patient denies any excessive worrying or anxiety or significant depression. Patient denies any problems with insomnia. Patient denies excessive thirst, polyuria, polydipsia. Patient denies any swollen glands, patient denies easy bruising or easy bleeding. Patient denies any recent infections, allergies or URI. Patient "s visual fields have not changed significantly in recent time.    PHYSICAL EXAM: BP (!) 156/96 (BP Location: Left Arm, Patient Position: Sitting)   Pulse 64   Temp (!) 96.6 F (35.9 C) (Tympanic)   Resp 18   Wt 218 lb 7.6 oz (99.1 kg)   BMI 30.47 kg/m  On rectal exam rectal sphincter tone is good prostate is smooth without evidence of nodularity or mass sulcus is preserved bilaterally.Well-developed well-nourished patient in NAD. HEENT reveals PERLA, EOMI, discs not visualized.  Oral cavity is clear. No oral mucosal lesions are identified. Neck is clear without evidence of cervical or supraclavicular adenopathy. Lungs are clear to A&P. Cardiac examination is essentially unremarkable with regular rate and rhythm without murmur rub or thrill. Abdomen is benign with no organomegaly or masses noted. Motor sensory and DTR levels are equal and symmetric in the upper and lower extremities. Cranial nerves II through XII are grossly intact. Proprioception is intact. No peripheral adenopathy or edema is identified. No motor or sensory levels are noted. Crude visual fields are within normal range.  LABORATORY DATA: pathology reports reviewed    RADIOLOGY RESULTS:CT scans will be read after they are  available.   IMPRESSION: stage IIa adenocarcinoma the prostate mostly Gleason 7 (3+4) presenting with a PSA of 11.22 in55 year old male  PLAN: at this time I got over treatment recognitions with the patient including robotic prostatectomy external beam radiation as well as I-125 interstitial implant. Based on his age excellent health I have run the [place] a low risk of lymph node involvement less than 5%. Does have a slight chance of extracapsular extension although that can be reviewed at the time of CT scan. I have discussed with him the ability to do salvage radiation therapy should he have biochemical failure after prostatectomy or indication such as positive margins seminal vesicle involvement. Patient has concluded he wants to proceed with robotic prostatectomy in or he has a follow-up appoint with Dr. [name]. I will review his CT scans when it becomes available should he have any indications why prostatectomy would not be feasible will reevaluate the patient at that time.  I would like to take this opportunity to thank you for allowing me to participate in the care of your patient.Noreene Filbert, MD

## 2018-07-26 ENCOUNTER — Ambulatory Visit
Admission: RE | Admit: 2018-07-26 | Discharge: 2018-07-26 | Disposition: A | Discharge status: Home / Self Care | Payer: 59 | Source: Ambulatory Visit | Attending: Urology | Admitting: Urology

## 2018-07-26 ENCOUNTER — Telehealth: Payer: Self-pay | Admitting: Family Medicine

## 2018-07-26 DIAGNOSIS — C61 Malignant neoplasm of prostate: Secondary | ICD-10-CM | POA: Diagnosis not present

## 2018-07-26 HISTORY — DX: Malignant (primary) neoplasm, unspecified: C80.1

## 2018-07-26 LAB — POCT I-STAT CREATININE: Creatinine, Ser: 1 mg/dL (ref 0.61–1.24)

## 2018-07-26 MED ORDER — IOHEXOL 300 MG/ML  SOLN
100.0000 mL | Freq: Once | INTRAMUSCULAR | Status: AC | PRN
  Administered 2018-07-26: 100 mL via INTRAVENOUS

## 2018-07-26 NOTE — Telephone Encounter (Signed)
Please review

## 2018-07-26 NOTE — Telephone Encounter (Signed)
Patient wanted to say thank you for making him go get a PSA.  They found Cancer and he will probably have surgery to remove it.

## 2018-07-27 NOTE — Telephone Encounter (Signed)
Left message for patient to return call to office. 

## 2018-07-27 NOTE — Telephone Encounter (Signed)
-----   Message from Hollice Espy, MD sent at 07/26/2018  3:56 PM EST ----- Please let this patient know this CT scan looks fine.  We will review in more detail at this f/u.  Hollice Espy, MD

## 2018-07-28 ENCOUNTER — Telehealth: Payer: Self-pay | Admitting: Urology

## 2018-07-28 NOTE — Telephone Encounter (Signed)
Patient given results of CT. Patient verbalizes understanding.

## 2018-07-28 NOTE — Telephone Encounter (Signed)
Pt is returning call about results

## 2018-07-28 NOTE — Telephone Encounter (Signed)
Patient made aware of results per [facility]

## 2018-08-03 ENCOUNTER — Ambulatory Visit: Payer: 59 | Admitting: Urology

## 2018-08-03 ENCOUNTER — Telehealth: Payer: Self-pay | Admitting: Urology

## 2018-08-03 ENCOUNTER — Encounter: Payer: Self-pay | Admitting: Urology

## 2018-08-03 VITALS — BP 153/85 | HR 62 | Ht 71.0 in | Wt 219.0 lb

## 2018-08-03 DIAGNOSIS — C61 Malignant neoplasm of prostate: Secondary | ICD-10-CM | POA: Diagnosis not present

## 2018-08-03 NOTE — Telephone Encounter (Signed)
Jack Weaver was advised to call back with his decision, He has decided to proceed with surgery, Please advise pt at [phone]. Dr. [name] asked me to forward the message to [name].

## 2018-08-03 NOTE — Progress Notes (Signed)
08/03/2018  8:40 PM   Jack Weaver 01-01-1956 [id number]  Referring provider: [name], MD [address] [place], [place] [address]  Chief Complaint  Patient presents with  . Prostate Cancer    Follow up    HPI: Jack Weaver is a 63 yo M presents today for the evaluation and management of a history of prostate cancer.   Prostate cancer history:  Initially, he has been trending upward since 2018.  He ultimately elected to undergo prostate biopsy on 06/29/2017 which showed intermediate risk prostate cancer, Gleason 4+3 involving a single core at the right base (73%), 3+4 at the right lateral base and right mid gland as well as 4 additional cores of Gleason 3+3 primarily on the right with a single core at the left apex.  Overall, he has 7 of 12 cores involved.  Rectal exam unremarkable, T1c.  TRUS vol 53.8 g  He has had an appendectomy (lap).  After meeting with Dr. Donella Stade at radiation oncology, he is still undecided with treatment plan regarding radiation vs prostatectomy.   In the interim, CT of the pelvis shows no evidence of metastatic disease.   PMH: Past Medical History:  Diagnosis Date  . Arthritis   . Cancer ([place])   . Hypertension     Surgical History: Past Surgical History:  Procedure Laterality Date  . APPENDECTOMY     At age 23  . HEMORROIDECTOMY  1930  . QUADRICEPS TENDON REPAIR Left 09/09/2017   Procedure: REPAIR QUADRICEP TENDON;  Surgeon: Earnestine Leys, MD;  Location: [facility] ORS;  Service: Orthopedics;  Laterality: Left;    Home Medications:  Allergies as of 08/03/2018   No Known Allergies     Medication List       Accurate as of August 03, 2018  8:40 PM. Always use your most recent med list.        acetaminophen 500 MG tablet Commonly known as:  TYLENOL Take 1,000 mg by mouth every 8 (eight) hours as needed for mild pain or moderate pain.   carisoprodol 350 MG tablet Commonly known as:  Soma Take 1 tablet (350 mg  total) by mouth 3 (three) times daily as needed.   diclofenac sodium 1 % Gel Commonly known as:  VOLTAREN Use up to 4 times a day to the affected area as needed for pain.   diltiazem 240 MG 24 hr capsule Commonly known as:  CARDIZEM CD TAKE 1 CAPSULE BY MOUTH EVERY DAY   fluticasone 50 MCG/ACT nasal spray Commonly known as:  FLONASE USE 2 SPRAYS EACH NOSTRIL EVERY DAY   lisinopril 40 MG tablet Commonly known as:  PRINIVIL,ZESTRIL TAKE 1 TABLET BY MOUTH DAILY   LORazepam 0.5 MG tablet Commonly known as:  Ativan Take 1 tablet (0.5 mg total) by mouth every 8 (eight) hours.   multivitamin with minerals Tabs tablet Take 1 tablet by mouth daily.   omeprazole 40 MG capsule Commonly known as:  PriLOSEC Take 1 capsule (40 mg total) by mouth daily.   traMADol 50 MG tablet Commonly known as:  Ultram Take 1 tablet (50 mg total) by mouth every 6 (six) hours as needed.       Allergies: No Known Allergies  Family History: Family History  Problem Relation Age of Onset  . Heart attack Mother   . Stroke Father   . Asthma Father   . Cancer Brother   . Diabetes Brother   . Prostate cancer Neg Hx   .  Kidney cancer Neg Hx   . Bladder Cancer Neg Hx     Social History:  reports that he has been smoking cigars. He has never used smokeless tobacco. He reports current alcohol use. He reports that he does not use drugs.  ROS: UROLOGY Frequent Urination?: No Hard to postpone urination?: Yes Burning/pain with urination?: No Get up at night to urinate?: No Leakage of urine?: Yes Urine stream starts and stops?: No Trouble starting stream?: No Do you have to strain to urinate?: No Blood in urine?: No Urinary tract infection?: No Sexually transmitted disease?: No Injury to kidneys or bladder?: No Painful intercourse?: No Weak stream?: No Erection problems?: No Penile pain?: No  Gastrointestinal Nausea?: No Vomiting?: No Indigestion/heartburn?: No Diarrhea?:  No Constipation?: No  Constitutional Fever: No Night sweats?: No Weight loss?: No Fatigue?: No  Skin Skin rash/lesions?: No Itching?: No  Eyes Blurred vision?: No Double vision?: No  Ears/Nose/Throat Sore throat?: No Sinus problems?: No  Hematologic/Lymphatic Swollen glands?: No Easy bruising?: No  Cardiovascular Leg swelling?: No Chest pain?: No  Respiratory Cough?: No Shortness of breath?: No  Endocrine Excessive thirst?: No  Musculoskeletal Back pain?: No Joint pain?: No  Neurological Headaches?: No Dizziness?: No  Psychologic Depression?: No Anxiety?: No  Physical Exam: BP (!) 153/85   Pulse 62   Ht 5\' 11"  (1.803 m)   Wt 219 lb (99.3 kg)   BMI 30.54 kg/m   Constitutional:  Alert and oriented, No acute distress.  Accompanied by his wife today.  Anxious at times. HEENT: [place] AT, moist mucus membranes.  Trachea midline, no masses. Cardiovascular: No clubbing, cyanosis, or edema. Respiratory: Normal respiratory effort, no increased work of breathing. Skin: No rashes, bruises or suspicious lesions. Neurologic: Grossly intact, no focal deficits, moving all 4 extremities. Psychiatric: Normal mood and affect.  Pertinent Imaging: CLINICAL DATA:  Newly diagnosed prostate cancer  EXAM: CT PELVIS WITH CONTRAST  TECHNIQUE: Multidetector CT imaging of the pelvis was performed using the standard protocol following the bolus administration of intravenous contrast.  CONTRAST:  166mL OMNIPAQUE IOHEXOL 300 MG/ML  SOLN  COMPARISON:  CT abdomen/pelvis dated 01/06/2018  FINDINGS: Urinary Tract:  Bladder is within normal limits.  Bowel:  Sigmoid diverticulosis, without evidence of diverticulitis.  Vascular/Lymphatic: Atherosclerotic calcifications the abdominal aorta and branch vessels.  No suspicious pelvic lymphadenopathy.  Reproductive: Prostate is grossly unremarkable on CT, in this patient with known prostate cancer.  Other:  No  pelvic ascites.  Musculoskeletal: Mild degenerative changes the lower lumbar spine. No focal osseous lesions.  IMPRESSION: No findings suspicious for metastatic disease in this patient with newly diagnosed prostate cancer.  Please note that the abdomen was not imaged.   Electronically Signed   By: Julian Hy M.D.   On: 07/26/2018 14:01  I have personally reviewed the images and agree with radiologist interpretation.   Assessment & Plan:    1. Prostate cancer  63 year old male with newly diagnosed Gleason 4+3 prostate cancer, cT1c, primarily on the right.  He falls into the intermediate risk category per NCCN guidelines, unfavorable. CT of pelvis w contrast showed no evidence of metastatic disease  Pt undecided on treatment plan regarding radiation or prostatectomy  Discussed the pros and cons of radiation vs prostatectomy Explained to pt the reoccurance rate for both are the same and if cancer reoccurs after radiation, surgery is not an option.  Explained that after surgery an overnight hospital stay is required and a catheter will be placed for 7 days. Will  potentially have urinary leakage after catheter removal and informed that pt cannot lift weights for 4-6 weeks due to possible hernia.  We discussed, in detail, the risks and expectations of surgery with regard to cancer control, urinary control, and erectile dysfunction as well as expected post operative cover he processed. Additional risks of surgery including but not limalited to bleeding, infection, hernia formation, nerve damage, steel formation, bowel/rect injury, potentially necessitating colostomy, damage to the urinary tract resulting in urinary leakage, urethral stricture, and cardiopulmonary risk such as myocardial infarction, stroke, death, thromboembolism etc. were explained. Referral to PT prior to prostatectomy recommended for pelvic floor exercises for faster recovery process post surgery.  Patient is  agreeable to prostatectomy w/ bilateral pelvic lymph node dissection, plan for non-sparing procedure given extent of disease  [facility] [address] [place], [place] [address] [phone]  I, [name], am acting as a scribe for Dr. Hollice Espy,  I have reviewed the above documentation for accuracy and completeness, and I agree with the above.   Hollice Espy, MD   I spent 25 min with this patient of which greater than 50% was spent in counseling and coordination of care with the patient.

## 2018-08-03 NOTE — Telephone Encounter (Signed)
Jack Weaver was advised to call back with his decision, He has decided to proceed with surgery, Please advise pt at [phone].

## 2018-08-04 ENCOUNTER — Telehealth: Payer: Self-pay | Admitting: Urology

## 2018-08-04 NOTE — Telephone Encounter (Signed)
Pt states he would like to wait for surgery until after 08/28/2018, so that his Medicare will help pay.

## 2018-08-04 NOTE — Telephone Encounter (Signed)
Pt came by office and wanted to let Dr [name] know that he is going w/ surgery, total removal. He states you can call him baqck at [phone]. Please advise.

## 2018-08-06 NOTE — Telephone Encounter (Signed)
Patient would like to schedule prostatectomy after 08/28/2018 as he will be eligible for Medicare after that date.

## 2018-08-10 ENCOUNTER — Other Ambulatory Visit: Payer: Self-pay | Admitting: Radiology

## 2018-08-10 DIAGNOSIS — C61 Malignant neoplasm of prostate: Secondary | ICD-10-CM

## 2018-08-10 NOTE — Telephone Encounter (Signed)
Discussed the [facility] Information form below to both patient & wife.    [address], [place], [place] [address] Telephone: [phone] Fax: [phone]   Thank you for choosing [facility] for your upcoming surgery!  We are always here to assist in your urological needs.  Please read the following information with specific details for your upcoming appointments related to your surgery. Please contact Timoty Bourke at [phone] Option 3 with any questions.  The Name of Your Surgery: Robot assisted laparoscopic prostatectomy with bilateral pelvic lymph node dissection Your Surgery Date: 09/13/2018 Your Surgeon: Hollice Espy  Please call Same Day Surgery at [phone] between the hours of 1pm-3pm one day prior to your surgery. They will inform you of the time to arrive at Same Day Surgery which is located on the second floor of the [facility].   Please refer to the attached letter regarding instructions for Pre-Admission Testing. You will receive a call from the [place] office regarding your appointment with them.  The Pre-Admission Testing office is located at [place], on the first floor of the [facility] in [place] (office is to the right as you enter through the [employer] of the [facility]). Please have all medications you are currently taking and your insurance card available.   Patient was advised to have nothing to eat or drink after midnight the night prior to surgery except that he may have only water until 2 hours before surgery with nothing to drink within 2 hours of surgery.  The patient states he currently takes no blood thinners. Patient's & wife's questions were answered and they expressed understanding of these instructions.

## 2018-08-11 ENCOUNTER — Other Ambulatory Visit: Payer: Self-pay | Admitting: Family Medicine

## 2018-08-11 MED ORDER — LISINOPRIL 40 MG PO TABS: 40.0000 mg | ORAL_TABLET | Freq: Every day | ORAL | 3 refills | Status: DC

## 2018-08-11 NOTE — Telephone Encounter (Signed)
Pt needing refills on:  lisinopril (PRINIVIL,ZESTRIL) 40 MG tablet  diltiazem (CARDIZEM CD) 240 MG 24 hr capsule  Please fill at:  [id number] [address], [place] [phone] (Phone) [phone] (Fax)   Thanks, [employer]

## 2018-08-23 ENCOUNTER — Telehealth: Payer: Self-pay | Admitting: Urology

## 2018-08-23 NOTE — Telephone Encounter (Signed)
Pt called office asking about physical therapy.  Pt knows all PT has been stopped temporarily,but wants to know if this is necessary to start before his surgery date w/ [name] of 4/20.  I didn't see where pt had surgery scheduled.  Please call pt.

## 2018-08-23 NOTE — Telephone Encounter (Signed)
Does patient need to do PT prior to having surgery?

## 2018-08-24 NOTE — Telephone Encounter (Signed)
Informed patient that PT is recommended before surgery to reduce urinary symptoms following surgery. At this time it is unknown if surgery will proceed on 09/13/2018 as planned. Reassured patient that he will be notified as soon as it is clear if surgery needs to be postponed and the status of PT prior to surgery. Questions answered. Patient expresses understanding of conversation.

## 2018-08-30 NOTE — Telephone Encounter (Signed)
Notified patient that per Dr [name] we are not going to be able to procede with surgery likely until June. Offered reassurance and a virtual visit with Dr [name] to discuss options. Appointment made. Questions answered. Patient voices understanding of conversation.

## 2018-08-31 ENCOUNTER — Telehealth (INDEPENDENT_AMBULATORY_CARE_PROVIDER_SITE_OTHER): Payer: 59 | Admitting: Urology

## 2018-08-31 ENCOUNTER — Other Ambulatory Visit: Payer: Self-pay

## 2018-08-31 DIAGNOSIS — C61 Malignant neoplasm of prostate: Secondary | ICD-10-CM

## 2018-08-31 NOTE — Progress Notes (Signed)
Virtual Visit via Video Note  I connected with Jack Weaver on 08/31/18 at 12:00 PM EDT by a video enabled telemedicine application and verified that I am speaking with the correct person using two identifiers.   I discussed the limitations of evaluation and management by telemedicine and the availability of in person appointments. The patient expressed understanding and agreed to proceed.  History of Present Illness: 63 year old male with personal history of unfavorable intermediate risk prostate cancer awaiting radical robotic prostatectomy who returns today via video chat due to is concerned about delay in his operative care in light of COVID-19 situation.   Observations/Objective:  Appears well, at work, pleasant  Assessment and Plan:  1. Prostate cancer [facility]) We had a lengthy discussion today about her current environment in the setting of COVID-19 pandemic.  We discussed guidelines as handed down by CMS/AUA/American College of surgeons all recommending that any procedure that can reasonably be postponed be postponed.  We discussed own limitations in the hospital with traumatic shortage of PPE as well as concern for nosocomial infection.  We discussed that it would be unlikely  to have significant clinical progression by putting off his surgery until June.    We did also discuss alternative options today for consideration including proceeding with radiation versus temporizing ADT.  We discussed risk and benefits of each.  He is not interested in radiation.  He is also declined ADT at this time.  All questions answered today.  Patient was reassured and offered support during this unprecedented time.  I provided 10 minutes of non-face-to-face time during this encounter.   Hollice Espy, MD

## 2018-09-08 ENCOUNTER — Ambulatory Visit: Payer: 59 | Admitting: Urology

## 2018-09-20 ENCOUNTER — Ambulatory Visit: Payer: 59

## 2018-10-05 ENCOUNTER — Other Ambulatory Visit: Payer: Self-pay | Admitting: Radiology

## 2018-10-05 ENCOUNTER — Ambulatory Visit: Payer: 59 | Attending: Urology | Admitting: Physical Therapy

## 2018-10-05 ENCOUNTER — Telehealth: Payer: Self-pay | Admitting: Radiology

## 2018-10-05 ENCOUNTER — Other Ambulatory Visit: Payer: Self-pay

## 2018-10-05 DIAGNOSIS — M6208 Separation of muscle (nontraumatic), other site: Secondary | ICD-10-CM | POA: Diagnosis present

## 2018-10-05 DIAGNOSIS — R278 Other lack of coordination: Secondary | ICD-10-CM | POA: Diagnosis present

## 2018-10-05 DIAGNOSIS — M6281 Muscle weakness (generalized): Secondary | ICD-10-CM | POA: Insufficient documentation

## 2018-10-05 NOTE — Telephone Encounter (Signed)
Notified patient that robotic prostatectomy has been rescheduled with Dr [name] on 10/11/2018. Notified patient of pre-admission testing appointment and that he will be screened for COVID-19 at that appointment. Advised patient to hold NSAIDs and aspirin products beginning immediately. Questions answered. Patient expresses understanding of conversation.

## 2018-10-06 NOTE — Therapy (Signed)
[place] MAIN [facility] SERVICES [address] [place], [place], [address] Phone: [phone]   Fax:  [phone]  Physical Therapy Evaluation / Discharge Summary   Patient Details  Name: Jack Weaver MRN: [id number] Date of Birth: 21-Aug-1955 No data recorded  Encounter Date: 10/05/2018  PT End of Session - 10/05/18 1353    Visit Number  1    Number of Visits  1    Date for PT Re-Evaluation  10/06/18    Activity Tolerance  Patient tolerated treatment well    Behavior During Therapy  [facility] for tasks assessed/performed       Past Medical History:  Diagnosis Date  . Arthritis   . Cancer ([place])   . Hypertension     Past Surgical History:  Procedure Laterality Date  . APPENDECTOMY     At age 15  . HEMORROIDECTOMY  1930  . QUADRICEPS TENDON REPAIR Left 09/09/2017   Procedure: REPAIR QUADRICEP TENDON;  Surgeon: Earnestine Leys, MD;  Location: [facility] ORS;  Service: Orthopedics;  Laterality: Left;    There were no vitals filed for this visit.   Subjective Assessment - 10/05/18 1358    Subjective 1) Pt is scheduled prostate surgery 10/11/18. 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  Pt's physical fitness routine include: roadcycling and running.   2) urge incontinence has been occuring since he was 35-6 year old.  Pt has urge with the sound of running water and leakage if he waits too long.  60% of the time, leakage occurs with urge, pt has leakage.   Denied SUI.  Regular bowel movements without straining.   3) LBP started last week in the middle of the low back at 8/10 . It feels stiff. Non radiating pain. Currently pain decreased to 1/10 with ice and Alleve.           Pertinent History  L knee mensicus tear.  surgery. Appendectomy.  Hx of recent groin injury with sprinting         [facility] PT Assessment - 10/06/18 1047      Observation/Other Assessments    Observations  slumped sitting       Coordination   Gross Motor Movements are Fluid and Coordinated  --   chest breathing     Squat   Comments  poor alignment       Other:   Other/ Comments  poor alignment in simulated tasks at work, with pulling, pushing, lifting       Palpation   Spinal mobility  thoracic hypomobility , tenderness at R pelvic floor ( reported Hx of groin injury )                 Objective measurements completed on examination: See above findings.    Pelvic Floor Special Questions - 10/06/18 1049    Diastasis Recti  3 fingers width below sternum, abdminal bulging with headlift    Urinary frequency  reported needing to go when washing hands pre/ post session    External Palpation  excessive use of glut/adductor with cue for pelvic floor mm       [facility] Adult PT Treatment/Exercise - 10/06/18 1044      Therapeutic Activites    Therapeutic Activities  --   body mechanics at work to decrease strain on spine/ pelvis     Neuro Re-ed    Neuro  Re-ed Details   see pt instructions , technique for pelvic floor and deep core strengthening       Manual Therapy   Manual therapy comments  STM over lateral anterior ribs to optimize descent of ribs for exhalation, engagement of deep core                   PT Long Term Goals - 10/05/18 1545      PT LONG TERM GOAL #1   Title  Pt will demo proper deep core coordination with breathing and pelvic floor     Baseline  --    Time  1    Period  Days    Status  Achieved    Target Date  10/06/18      PT LONG TERM GOAL #2   Title  Pt will demo improve thoracic mobility to optimize diaphragmatic /pelvic excursion     Baseline  --    Time  1    Period  Days    Status  Achieved    Target Date  10/06/18      PT LONG TERM GOAL #3   Title  Pt will demo pelvic floor endurance holds 3 sec, 5 reps with optimal pelvic floor ROM in between contractions in sitting and supine in order to minimize urinary leakage  post surgery    Baseline  --    Time  1    Period  Days    Status  Achieved    Target Date  10/06/18      PT LONG TERM GOAL #4   Title  Pt will demo proper alignment and technique with simulated work tasks, pushing, pulling lifting in order to  minimize abdominopelvic dysfunctions    Baseline  --    Time  1    Period  Days    Status  Achieved    Target Date  11/30/18      PT LONG TERM GOAL #5   Title  Pt will demo proper body mechanics with lifting and carrying baby to minimize LBP and pelvic pain    Time  6    Period  Weeks    Status  New    Target Date  11/16/18             Plan - 10/06/18 1100    Clinical Impression Statement  Pt is a 63   yo male who is scheduled for prostatectomy on 10/11/18. Pt was referred to Pelvic Health PT to train his pelvic floor mm to yield better outcomes with continence post-surgery. Pt 's clinical presentations included signs of poor intraabdominal pressure system which is associated increased risk for urinary incontinence: _diastasis recti ( 3 fingers width)  _dyscoordination of deep core mm _lack of understanding on proper mechanics with work tasks ( lifting, pushing, pulling, reaching) to place less strain the abdomen/pelvic floor.  _Hx of groin injury  Pt was provided education on etiology of Sx with anatomy, physiology explanation with images along with the benefits of customized pelvic PT Tx based on pt's medical conditions and musculoskeletal deficits.   Following Tx, pt demo'd improved proper deep core mm coordination, including diaphragmatic excursion and ribcage depression. Pt also demo'd improved co-activation of deep core mm with lifting, pulling, pushing at this work tasks. Pt also demo'd IND with proper breathing technique to optimize deep core and IND with pelvic floor endurance contractions. Pt was educated when to perform pelvic floor contractions  pre and post surgery  and to withhold during the week with catheter post surgery.    Pt is d/c today due to pending surgery next week.  Pt would benefit from a new referral for Pelvic PT due to pt's urge and LBP complaints and for further  instructions for pelvic floor training s/p surgery given his presence of diastasis recti and Hx of groin injury.    Due to [place] pandemic, pt was screened questions related to COVID at the beginning of session to deem pt appropriate for in-person treatment today.  Pt and DPT wore face masks and practiced hand hygiene pre and post Tx. Treatment table and office surfaces were wiped with germicidal wipes between patients. Patients' scheduled were spaced out to minimize crowding in waiting area. Precautions were taken to decrease close proximity as much as possible and manual Tx/ assessments were modified as much as possible to uphold these precautions.       Personal Factors and Comorbidities  Comorbidity 2    Comorbidities  HTN, ARTHRITIS     Stability/Clinical Decision Making  Evolving/Moderate complexity    Clinical Decision Making  Moderate    Rehab Potential  Good    PT Frequency  One time visit    PT Treatment/Interventions  Neuromuscular re-education;Functional mobility training;Therapeutic activities;Therapeutic exercise;Patient/family education;Energy conservation;Manual techniques    Consulted and Agree with Plan of Care  Patient       Patient will benefit from skilled therapeutic intervention in order to improve the following deficits and impairments:  Decreased endurance, Decreased range of motion, Decreased safety awareness, Decreased strength, Hypomobility  Visit Diagnosis: Other lack of coordination  Diastasis recti  Muscle weakness (generalized)     Problem List Patient Active Problem List   Diagnosis Date Noted  . Lumbar sprain 09/04/2017  . Traumatic rupture of quadriceps tendon 09/02/2017  . Bursitis of hip 05/31/2015  . ED (erectile dysfunction) of organic origin 11/18/2014  . BP (high blood pressure)  11/18/2014  . Neuropathy 11/18/2014  . Allergic rhinitis 11/14/2014  . Arthropathia 11/26/2009  . History of tobacco use 07/31/2008  . Acid reflux 07/26/2008  . Arthritis, degenerative 07/26/2008  . Apnea, sleep 06/30/2008    Jerl Mina ,PT, DPT, E-RYT  10/06/2018, 11:04 AM  [place] MAIN [facility] SERVICES [address] [place], [place], [address] Phone: [phone]   Fax:  [phone]  Name: LAMAJ METOYER MRN: [id number] Date of Birth: 1955/09/08

## 2018-10-07 ENCOUNTER — Other Ambulatory Visit: Payer: Self-pay

## 2018-10-07 ENCOUNTER — Encounter
Admission: RE | Admit: 2018-10-07 | Discharge: 2018-10-07 | Disposition: A | Discharge status: Home / Self Care | Payer: 59 | Source: Ambulatory Visit | Attending: Urology | Admitting: Urology

## 2018-10-07 DIAGNOSIS — Z01818 Encounter for other preprocedural examination: Secondary | ICD-10-CM | POA: Diagnosis not present

## 2018-10-07 DIAGNOSIS — Z1159 Encounter for screening for other viral diseases: Secondary | ICD-10-CM | POA: Diagnosis not present

## 2018-10-07 DIAGNOSIS — I10 Essential (primary) hypertension: Secondary | ICD-10-CM | POA: Insufficient documentation

## 2018-10-07 LAB — BASIC METABOLIC PANEL
Anion gap: 7 (ref 5–15)
BUN: 17 mg/dL (ref 8–23)
CO2: 24 mmol/L (ref 22–32)
Calcium: 9.4 mg/dL (ref 8.9–10.3)
Chloride: 110 mmol/L (ref 98–111)
Creatinine, Ser: 0.91 mg/dL (ref 0.61–1.24)
GFR calc Af Amer: >60 mL/min (ref >60)
GFR calc non Af Amer: >60 mL/min (ref >60)
Glucose, Bld: 111 mg/dL — ABNORMAL HIGH (ref 70–99)
Potassium: 4 mmol/L (ref 3.5–5.1)
Sodium: 141 mmol/L (ref 135–145)

## 2018-10-07 LAB — URINALYSIS, ROUTINE W REFLEX MICROSCOPIC
Bilirubin Urine: NEGATIVE
Glucose, UA: NEGATIVE mg/dL
Hgb urine dipstick: NEGATIVE
Ketones, ur: NEGATIVE mg/dL
Leukocytes,Ua: NEGATIVE
Nitrite: NEGATIVE
Protein, ur: NEGATIVE mg/dL
Specific Gravity, Urine: 1.018 (ref 1.005–1.030)
pH: 7 (ref 5.0–8.0)

## 2018-10-07 LAB — CBC
HCT: 45.2 % (ref 39.0–52.0)
Hemoglobin: 14.8 g/dL (ref 13.0–17.0)
MCH: 28.1 pg (ref 26.0–34.0)
MCHC: 32.7 g/dL (ref 30.0–36.0)
MCV: 85.8 fL (ref 80.0–100.0)
Platelets: 225 10*3/uL (ref 150–400)
RBC: 5.27 MIL/uL (ref 4.22–5.81)
RDW: 13.2 % (ref 11.5–15.5)
WBC: 4.9 10*3/uL (ref 4.0–10.5)
nRBC: 0 % (ref 0.0–0.2)

## 2018-10-07 LAB — TYPE AND SCREEN
ABO/RH(D): O POS
Antibody Screen: NEGATIVE

## 2018-10-07 LAB — PROTIME-INR
INR: 1 (ref 0.8–1.2)
Prothrombin Time: 13.5 seconds (ref 11.4–15.2)

## 2018-10-07 NOTE — Patient Instructions (Signed)
Your procedure is scheduled on: Monday 10/11/18 Report to [place]. To find out your arrival time please call [phone] between 1PM - 3PM on Friday 10/08/18.  Remember: Instructions that are not followed completely may result in serious medical risk, up to and including death, or upon the discretion of your surgeon and anesthesiologist your surgery may need to be rescheduled.     _X__ 1. Do not eat food after midnight the night before your procedure.                 No gum chewing or hard candies. You may drink clear liquids up to 2 hours                 before you are scheduled to arrive for your surgery- DO not drink clear                 liquids within 2 hours of the start of your surgery.                 Clear Liquids include:  water, apple juice without pulp, clear carbohydrate                 drink such as Clearfast or Gatorade, Black Coffee or Tea (Do not add                 anything to coffee or tea).  __X__2.  On the morning of surgery brush your teeth with toothpaste and water, you                 may rinse your mouth with mouthwash if you wish.  Do not swallow any              toothpaste of mouthwash.     _X__ 3.  No Alcohol for 24 hours before or after surgery.   _X__ 4.  Do Not Smoke or use e-cigarettes For 24 Hours Prior to Your Surgery.                 Do not use any chewable tobacco products for at least 6 hours prior to                 surgery.  ____  5.  Bring all medications with you on the day of surgery if instructed.   __X__  6.  Notify your doctor if there is any change in your medical condition      (cold, fever, infections).     Do not wear jewelry, make-up, hairpins, clips or nail polish. Do not wear lotions, powders, or perfumes.  Do not shave 48 hours prior to surgery. Men may shave face and neck. Do not bring valuables to the hospital.    [facility] is not responsible for any belongings or  valuables.  Contacts, dentures/partials or body piercings may not be worn into surgery. Bring a case for your contacts, glasses or hearing aids, a denture cup will be supplied. Leave your suitcase in the car. After surgery it may be brought to your room. For patients admitted to the hospital, discharge time is determined by your treatment team.   Patients discharged the day of surgery will not be allowed to drive home.   Please read over the following fact sheets that you were given:   MRSA Information  __X__ Take these medicines the morning of surgery with A SIP OF WATER:  1. diltiazem (CARDIZEM   2. omeprazole (PRILOSEC)  3.   4.  5.  6.  ____ Fleet Enema (as directed)   __X__ Use CHG Soap/SAGE wipes as directed  ____ Use inhalers on the day of surgery  ____ Stop metformin/Janumet/Farxiga 2 days prior to surgery    ____ Take 1/2 of usual insulin dose the night before surgery. No insulin the morning          of surgery.   ____ Stop Blood Thinners Coumadin/Plavix/Xarelto/Pleta/Pradaxa/Eliquis/Effient/Aspirin  on   Or contact your Surgeon, Cardiologist or Medical Doctor regarding  ability to stop your blood thinners  __X__ Stop Anti-inflammatories 7 days before surgery such as Advil, Ibuprofen, Motrin,  BC or Goodies Powder, Naprosyn, Naproxen, Aleve, Aspirin    __X__ Stop all herbal supplements, fish oil or vitamin E until after surgery.    ____ Bring C-Pap to the hospital.

## 2018-10-08 ENCOUNTER — Other Ambulatory Visit: Payer: 59

## 2018-10-08 LAB — URINE CULTURE: Culture: NO GROWTH

## 2018-10-08 LAB — NOVEL CORONAVIRUS, NAA (HOSP ORDER, SEND-OUT TO REF LAB; TAT 18-24 HRS): SARS-CoV-2, NAA: NOT DETECTED

## 2018-10-11 ENCOUNTER — Other Ambulatory Visit: Payer: Self-pay

## 2018-10-11 ENCOUNTER — Encounter
Admission: RE | Disposition: A | Discharge status: Home / Self Care | Payer: Self-pay | Source: Home / Self Care | Attending: Urology

## 2018-10-11 ENCOUNTER — Ambulatory Visit: Payer: 59 | Admitting: Certified Registered Nurse Anesthetist

## 2018-10-11 ENCOUNTER — Observation Stay
Admission: RE | Admit: 2018-10-11 | Discharge: 2018-10-12 | Disposition: A | Discharge status: Home / Self Care | Payer: 59 | Attending: Urology | Admitting: Urology

## 2018-10-11 DIAGNOSIS — Z79899 Other long term (current) drug therapy: Secondary | ICD-10-CM | POA: Diagnosis not present

## 2018-10-11 DIAGNOSIS — G4730 Sleep apnea, unspecified: Secondary | ICD-10-CM | POA: Diagnosis not present

## 2018-10-11 DIAGNOSIS — K219 Gastro-esophageal reflux disease without esophagitis: Secondary | ICD-10-CM | POA: Diagnosis not present

## 2018-10-11 DIAGNOSIS — I10 Essential (primary) hypertension: Secondary | ICD-10-CM | POA: Insufficient documentation

## 2018-10-11 DIAGNOSIS — M1990 Unspecified osteoarthritis, unspecified site: Secondary | ICD-10-CM | POA: Insufficient documentation

## 2018-10-11 DIAGNOSIS — F17290 Nicotine dependence, other tobacco product, uncomplicated: Secondary | ICD-10-CM | POA: Insufficient documentation

## 2018-10-11 DIAGNOSIS — C61 Malignant neoplasm of prostate: Secondary | ICD-10-CM | POA: Diagnosis present

## 2018-10-11 DIAGNOSIS — C775 Secondary and unspecified malignant neoplasm of intrapelvic lymph nodes: Secondary | ICD-10-CM | POA: Insufficient documentation

## 2018-10-11 HISTORY — PX: PELVIC LYMPH NODE DISSECTION: SHX6543

## 2018-10-11 HISTORY — PX: ROBOT ASSISTED LAPAROSCOPIC RADICAL PROSTATECTOMY: SHX5141

## 2018-10-11 LAB — ABO/RH: ABO/RH(D): O POS

## 2018-10-11 SURGERY — ROBOTIC ASSISTED LAPAROSCOPIC RADICAL PROSTATECTOMY
Anesthesia: General | Site: Abdomen

## 2018-10-11 MED ORDER — PANTOPRAZOLE SODIUM 40 MG PO TBEC
80.0000 mg | DELAYED_RELEASE_TABLET | Freq: Every day | ORAL | Status: DC
  Administered 2018-10-12: 80 mg via ORAL
  Filled 2018-10-11: qty 2

## 2018-10-11 MED ORDER — LACTATED RINGERS IV SOLN
INTRAVENOUS | Status: DC
  Administered 2018-10-11: 10:00:00 via INTRAVENOUS

## 2018-10-11 MED ORDER — THROMBIN 5000 UNITS EX SOLR
CUTANEOUS | Status: AC
  Filled 2018-10-11: qty 5000

## 2018-10-11 MED ORDER — FENTANYL CITRATE (PF) 100 MCG/2ML IJ SOLN
25.0000 ug | INTRAMUSCULAR | Status: AC | PRN
  Administered 2018-10-11 (×2): 25 ug via INTRAVENOUS

## 2018-10-11 MED ORDER — OXYCODONE HCL 5 MG/5ML PO SOLN: 5.0000 mg | Freq: Once | ORAL | Status: DC | PRN

## 2018-10-11 MED ORDER — HEPARIN SODIUM (PORCINE) 5000 UNIT/ML IJ SOLN
5000.0000 [IU] | Freq: Three times a day (TID) | INTRAMUSCULAR | Status: DC
  Administered 2018-10-11 – 2018-10-12 (×2): 5000 [IU] via SUBCUTANEOUS
  Filled 2018-10-11 (×2): qty 1

## 2018-10-11 MED ORDER — OXYBUTYNIN CHLORIDE 5 MG PO TABS: 5.0000 mg | ORAL_TABLET | Freq: Three times a day (TID) | ORAL | 0 refills | Status: DC | PRN

## 2018-10-11 MED ORDER — ACETAMINOPHEN 10 MG/ML IV SOLN
INTRAVENOUS | Status: DC | PRN
  Administered 2018-10-11: 1000 mg via INTRAVENOUS

## 2018-10-11 MED ORDER — OXYCODONE-ACETAMINOPHEN 5-325 MG PO TABS
1.0000 | ORAL_TABLET | ORAL | Status: DC | PRN
  Administered 2018-10-11 (×2): 2 via ORAL
  Administered 2018-10-12: 1 via ORAL
  Administered 2018-10-12: 2 via ORAL
  Filled 2018-10-11 (×2): qty 2
  Filled 2018-10-11: qty 1
  Filled 2018-10-11: qty 2

## 2018-10-11 MED ORDER — FLUTICASONE PROPIONATE 50 MCG/ACT NA SUSP
2.0000 | Freq: Every day | NASAL | Status: DC | PRN
  Administered 2018-10-11: 2 via NASAL
  Filled 2018-10-11 (×2): qty 16

## 2018-10-11 MED ORDER — BUPIVACAINE HCL 0.5 % IJ SOLN
INTRAMUSCULAR | Status: DC | PRN
  Administered 2018-10-11: 20 mL

## 2018-10-11 MED ORDER — MIDAZOLAM HCL 2 MG/2ML IJ SOLN
INTRAMUSCULAR | Status: DC | PRN
  Administered 2018-10-11: 2 mg via INTRAVENOUS

## 2018-10-11 MED ORDER — CEFAZOLIN SODIUM-DEXTROSE 2-4 GM/100ML-% IV SOLN
2.0000 g | INTRAVENOUS | Status: AC
  Administered 2018-10-11: 2 g via INTRAVENOUS

## 2018-10-11 MED ORDER — ACETAMINOPHEN 325 MG PO TABS: 650.0000 mg | ORAL_TABLET | ORAL | Status: DC | PRN

## 2018-10-11 MED ORDER — OXYCODONE HCL 5 MG PO TABS: 5.0000 mg | ORAL_TABLET | Freq: Once | ORAL | Status: DC | PRN

## 2018-10-11 MED ORDER — DOCUSATE SODIUM 100 MG PO CAPS: 100.0000 mg | ORAL_CAPSULE | Freq: Two times a day (BID) | ORAL | 0 refills | Status: DC

## 2018-10-11 MED ORDER — MORPHINE SULFATE (PF) 2 MG/ML IV SOLN: 2.0000 mg | INTRAVENOUS | Status: DC | PRN

## 2018-10-11 MED ORDER — DOCUSATE SODIUM 100 MG PO CAPS
100.0000 mg | ORAL_CAPSULE | Freq: Two times a day (BID) | ORAL | Status: DC
  Administered 2018-10-11 – 2018-10-12 (×2): 100 mg via ORAL
  Filled 2018-10-11 (×2): qty 1

## 2018-10-11 MED ORDER — FENTANYL CITRATE (PF) 100 MCG/2ML IJ SOLN
INTRAMUSCULAR | Status: AC
  Filled 2018-10-11: qty 2

## 2018-10-11 MED ORDER — HYDROMORPHONE HCL 1 MG/ML IJ SOLN
INTRAMUSCULAR | Status: AC
  Administered 2018-10-11: 0.5 mg via INTRAVENOUS
  Filled 2018-10-11: qty 1

## 2018-10-11 MED ORDER — SODIUM CHLORIDE 0.9 % IV SOLN
INTRAVENOUS | Status: DC
  Administered 2018-10-11 – 2018-10-12 (×2): via INTRAVENOUS

## 2018-10-11 MED ORDER — LIDOCAINE HCL (CARDIAC) PF 100 MG/5ML IV SOSY
PREFILLED_SYRINGE | INTRAVENOUS | Status: DC | PRN
  Administered 2018-10-11: 100 mg via INTRAVENOUS

## 2018-10-11 MED ORDER — ONDANSETRON HCL 4 MG/2ML IJ SOLN
INTRAMUSCULAR | Status: DC | PRN
  Administered 2018-10-11: 4 mg via INTRAVENOUS

## 2018-10-11 MED ORDER — FENTANYL CITRATE (PF) 100 MCG/2ML IJ SOLN
25.0000 ug | INTRAMUSCULAR | Status: DC | PRN
  Administered 2018-10-11 (×3): 50 ug via INTRAVENOUS

## 2018-10-11 MED ORDER — BUPIVACAINE HCL (PF) 0.5 % IJ SOLN
INTRAMUSCULAR | Status: AC
  Filled 2018-10-11: qty 30

## 2018-10-11 MED ORDER — CEFAZOLIN SODIUM-DEXTROSE 2-4 GM/100ML-% IV SOLN
INTRAVENOUS | Status: AC
  Filled 2018-10-11: qty 100

## 2018-10-11 MED ORDER — HYDROMORPHONE HCL 1 MG/ML IJ SOLN
0.5000 mg | INTRAMUSCULAR | Status: AC | PRN
  Administered 2018-10-11 (×4): 0.5 mg via INTRAVENOUS

## 2018-10-11 MED ORDER — PROMETHAZINE HCL 25 MG/ML IJ SOLN: 6.2500 mg | INTRAMUSCULAR | Status: DC | PRN

## 2018-10-11 MED ORDER — SEVOFLURANE IN SOLN
RESPIRATORY_TRACT | Status: AC
  Filled 2018-10-11: qty 250

## 2018-10-11 MED ORDER — SUGAMMADEX SODIUM 200 MG/2ML IV SOLN
INTRAVENOUS | Status: DC | PRN
  Administered 2018-10-11: 196.4 mg via INTRAVENOUS

## 2018-10-11 MED ORDER — KETOROLAC TROMETHAMINE 30 MG/ML IJ SOLN
INTRAMUSCULAR | Status: DC | PRN
  Administered 2018-10-11: 15 mg via INTRAVENOUS

## 2018-10-11 MED ORDER — LISINOPRIL 20 MG PO TABS
40.0000 mg | ORAL_TABLET | Freq: Every day | ORAL | Status: DC
  Administered 2018-10-12: 40 mg via ORAL
  Filled 2018-10-11 (×3): qty 2

## 2018-10-11 MED ORDER — FENTANYL CITRATE (PF) 100 MCG/2ML IJ SOLN
INTRAMUSCULAR | Status: DC | PRN
  Administered 2018-10-11: 50 ug via INTRAVENOUS
  Administered 2018-10-11: 100 ug via INTRAVENOUS
  Administered 2018-10-11: 50 ug via INTRAVENOUS

## 2018-10-11 MED ORDER — LACTATED RINGERS IV SOLN
INTRAVENOUS | Status: DC | PRN
  Administered 2018-10-11: 11:00:00 via INTRAVENOUS

## 2018-10-11 MED ORDER — MIDAZOLAM HCL 2 MG/2ML IJ SOLN
INTRAMUSCULAR | Status: AC
  Filled 2018-10-11: qty 2

## 2018-10-11 MED ORDER — DEXAMETHASONE SODIUM PHOSPHATE 10 MG/ML IJ SOLN
INTRAMUSCULAR | Status: DC | PRN
  Administered 2018-10-11: 4 mg via INTRAVENOUS

## 2018-10-11 MED ORDER — OXYBUTYNIN CHLORIDE 5 MG PO TABS: 5.0000 mg | ORAL_TABLET | Freq: Three times a day (TID) | ORAL | Status: DC | PRN

## 2018-10-11 MED ORDER — PROPOFOL 10 MG/ML IV BOLUS
INTRAVENOUS | Status: DC | PRN
  Administered 2018-10-11: 140 mg via INTRAVENOUS

## 2018-10-11 MED ORDER — DIPHENHYDRAMINE HCL 12.5 MG/5ML PO ELIX
12.5000 mg | ORAL_SOLUTION | Freq: Four times a day (QID) | ORAL | Status: DC | PRN
  Filled 2018-10-11: qty 5

## 2018-10-11 MED ORDER — FENTANYL CITRATE (PF) 100 MCG/2ML IJ SOLN
INTRAMUSCULAR | Status: AC
  Administered 2018-10-11: 25 ug via INTRAVENOUS
  Filled 2018-10-11: qty 2

## 2018-10-11 MED ORDER — THROMBIN 5000 UNITS EX SOLR
CUTANEOUS | Status: DC | PRN
  Administered 2018-10-11: 5000 [IU] via TOPICAL

## 2018-10-11 MED ORDER — ONDANSETRON HCL 4 MG/2ML IJ SOLN: 4.0000 mg | INTRAMUSCULAR | Status: DC | PRN

## 2018-10-11 MED ORDER — ROCURONIUM BROMIDE 100 MG/10ML IV SOLN
INTRAVENOUS | Status: DC | PRN
  Administered 2018-10-11: 10 mg via INTRAVENOUS
  Administered 2018-10-11: 5 mg via INTRAVENOUS
  Administered 2018-10-11: 50 mg via INTRAVENOUS
  Administered 2018-10-11: 20 mg via INTRAVENOUS
  Administered 2018-10-11: 15 mg via INTRAVENOUS

## 2018-10-11 MED ORDER — OXYCODONE-ACETAMINOPHEN 5-325 MG PO TABS: 1.0000 | ORAL_TABLET | ORAL | 0 refills | Status: DC | PRN

## 2018-10-11 MED ORDER — CEFAZOLIN SODIUM-DEXTROSE 1-4 GM/50ML-% IV SOLN
1.0000 g | Freq: Three times a day (TID) | INTRAVENOUS | Status: AC
  Administered 2018-10-11 – 2018-10-12 (×2): 1 g via INTRAVENOUS
  Filled 2018-10-11 (×2): qty 50

## 2018-10-11 MED ORDER — MEPERIDINE HCL 50 MG/ML IJ SOLN: 6.2500 mg | INTRAMUSCULAR | Status: DC | PRN

## 2018-10-11 MED ORDER — DILTIAZEM HCL ER COATED BEADS 240 MG PO CP24
240.0000 mg | ORAL_CAPSULE | Freq: Every day | ORAL | Status: DC
  Administered 2018-10-12: 240 mg via ORAL
  Filled 2018-10-11: qty 2
  Filled 2018-10-11 (×2): qty 1

## 2018-10-11 MED ORDER — DIPHENHYDRAMINE HCL 50 MG/ML IJ SOLN: 12.5000 mg | Freq: Four times a day (QID) | INTRAMUSCULAR | Status: DC | PRN

## 2018-10-11 MED ORDER — SODIUM CHLORIDE FLUSH 0.9 % IV SOLN
INTRAVENOUS | Status: AC
  Filled 2018-10-11: qty 10

## 2018-10-11 SURGICAL SUPPLY — 98 items
ANCHOR TIS RET SYS 1550ML (BAG) ×3 IMPLANT
ANCHOR TIS RET SYS 235ML (MISCELLANEOUS) ×3 IMPLANT
APPLICATOR SURGIFLO ENDO (HEMOSTASIS) ×3 IMPLANT
APPLIER CLIP LOGIC TI 5 (MISCELLANEOUS) IMPLANT
BAG URINE DRAINAGE (UROLOGICAL SUPPLIES) ×3 IMPLANT
BLADE CLIPPER SURG (BLADE) ×3 IMPLANT
BULB RESERV EVAC DRAIN JP 100C (MISCELLANEOUS) IMPLANT
CANISTER SUCT 1200ML W/VALVE (MISCELLANEOUS) ×3 IMPLANT
CATH DRAINAGE MALECOT 26FR (CATHETERS) ×2 IMPLANT
CATH FOL 2WAY LX 18X5 (CATHETERS) ×6 IMPLANT
CATH MALECOT (CATHETERS) ×3
CHLORAPREP W/TINT 26 (MISCELLANEOUS) ×6 IMPLANT
CLIP SUT LAPRA TY ABSORB (SUTURE) IMPLANT
CLIP VESOLOCK LG 6/CT PURPLE (CLIP) ×9 IMPLANT
CORD BIP STRL DISP 12FT (MISCELLANEOUS) ×3 IMPLANT
CORD MONOPOLAR M/FML 12FT (MISCELLANEOUS) IMPLANT
COVER TIP SHEARS 8 DVNC (MISCELLANEOUS) ×2 IMPLANT
COVER TIP SHEARS 8MM DA VINCI (MISCELLANEOUS) ×1
COVER WAND RF STERILE (DRAPES) ×3 IMPLANT
DEFOGGER SCOPE WARMER CLEARIFY (MISCELLANEOUS) ×3 IMPLANT
DERMABOND ADVANCED (GAUZE/BANDAGES/DRESSINGS) ×1
DERMABOND ADVANCED .7 DNX12 (GAUZE/BANDAGES/DRESSINGS) ×2 IMPLANT
DRAIN CHANNEL JP 15F RND 16 (MISCELLANEOUS) IMPLANT
DRAIN CHANNEL JP 19F (MISCELLANEOUS) IMPLANT
DRAPE COLUMN DVNC XI (DISPOSABLE) ×2 IMPLANT
DRAPE DA VINCI XI COLUMN (DISPOSABLE) ×1
DRAPE LEGGINS SURG 28X43 STRL (DRAPES) ×3 IMPLANT
DRAPE SHEET LG 3/4 BI-LAMINATE (DRAPES) IMPLANT
DRAPE SURG 17X11 SM STRL (DRAPES) ×12 IMPLANT
DRAPE UNDER BUTTOCK W/FLU (DRAPES) ×3 IMPLANT
DRIVER LRG NEEDLE DA VINCI (INSTRUMENTS) ×2
DRIVER NDLE LRG DVNC (INSTRUMENTS) ×4 IMPLANT
DRSG TELFA 3X8 NADH (GAUZE/BANDAGES/DRESSINGS) ×3 IMPLANT
ELECT REM PT RETURN 9FT ADLT (ELECTROSURGICAL) ×3
ELECTRODE REM PT RTRN 9FT ADLT (ELECTROSURGICAL) ×2 IMPLANT
FORCEPS MARYLAND BIPOLAR 8X55 (INSTRUMENTS) ×1
FORCEPS MRYLND BPLR 8X55 DVNC (INSTRUMENTS) ×2 IMPLANT
GLOVE BIO SURGEON STRL SZ 6.5 (GLOVE) ×9 IMPLANT
GOWN STRL REUS W/ TWL LRG LVL3 (GOWN DISPOSABLE) ×6 IMPLANT
GOWN STRL REUS W/TWL LRG LVL3 (GOWN DISPOSABLE) ×3
GRASPER SUT TROCAR 14GX15 (MISCELLANEOUS) ×3 IMPLANT
HEMOSTAT SURGICEL 2X14 (HEMOSTASIS) ×3 IMPLANT
HOLDER FOLEY CATH W/STRAP (MISCELLANEOUS) ×3 IMPLANT
IRRIGATION STRYKERFLOW (MISCELLANEOUS) ×2 IMPLANT
IRRIGATOR STRYKERFLOW (MISCELLANEOUS) ×3
IV LACTATED RINGERS 1000ML (IV SOLUTION) ×3 IMPLANT
KIT PINK PAD W/HEAD ARE REST (MISCELLANEOUS) ×3
KIT PINK PAD W/HEAD ARM REST (MISCELLANEOUS) ×2 IMPLANT
LABEL OR SOLS (LABEL) IMPLANT
MARKER SKIN DUAL TIP RULER LAB (MISCELLANEOUS) ×3 IMPLANT
NEEDLE HYPO 22GX1.5 SAFETY (NEEDLE) ×3 IMPLANT
NEEDLE INSUFFLATION 14GA 120MM (NEEDLE) ×6 IMPLANT
NS IRRIG 500ML POUR BTL (IV SOLUTION) ×3 IMPLANT
OBTURATOR OPTICAL STANDARD 8MM (TROCAR) ×1
OBTURATOR OPTICAL STND 8 DVNC (TROCAR) ×2
OBTURATOR OPTICALSTD 8 DVNC (TROCAR) ×2 IMPLANT
PACK LAP CHOLECYSTECTOMY (MISCELLANEOUS) ×3 IMPLANT
PENCIL ELECTRO HAND CTR (MISCELLANEOUS) ×3 IMPLANT
PROGRASP ENDOWRIST DA VINCI (INSTRUMENTS) ×1
PROGRASP ENDOWRIST DVNC (INSTRUMENTS) ×2 IMPLANT
RELOAD STAPLER WHITE 60MM (STAPLE) ×2 IMPLANT
SEAL CANN UNIV 5-8 DVNC XI (MISCELLANEOUS) ×2 IMPLANT
SEAL XI 5MM-8MM UNIVERSAL (MISCELLANEOUS) ×1
SET TUBE SMOKE EVAC HIGH FLOW (TUBING) ×3 IMPLANT
SLEEVE ENDOPATH XCEL 5M (ENDOMECHANICALS) ×6 IMPLANT
SOLUTION ELECTROLUBE (MISCELLANEOUS) ×3 IMPLANT
SPOGE SURGIFLO 8M (HEMOSTASIS) ×1
SPONGE LAP 4X18 RFD (DISPOSABLE) ×3 IMPLANT
SPONGE SURGIFLO 8M (HEMOSTASIS) ×2 IMPLANT
SPONGE VERSALON 4X4 4PLY (MISCELLANEOUS) IMPLANT
STAPLE ECHEON FLEX 60 POW ENDO (STAPLE) ×3 IMPLANT
STAPLER RELOAD WHITE 60MM (STAPLE) ×3
STAPLER SKIN PROX 35W (STAPLE) ×3 IMPLANT
STRAP SAFETY 5IN WIDE (MISCELLANEOUS) ×3 IMPLANT
SURGILUBE 2OZ TUBE FLIPTOP (MISCELLANEOUS) ×3 IMPLANT
SUT DVC VLOC 90 3-0 CV23 UNDY (SUTURE) ×3 IMPLANT
SUT DVC VLOC 90 3-0 CV23 VLT (SUTURE) ×3
SUT ETHILON 3-0 FS-10 30 BLK (SUTURE)
SUT MNCRL 4-0 (SUTURE) ×2
SUT MNCRL 4-0 27XMFL (SUTURE) ×4
SUT PROLENE 5 0 RB 1 DA (SUTURE) IMPLANT
SUT SILK 2 0 SH (SUTURE) ×3 IMPLANT
SUT VIC AB 0 CT1 36 (SUTURE) ×6 IMPLANT
SUT VIC AB 2-0 CT1 (SUTURE) ×6 IMPLANT
SUT VIC AB 2-0 SH 27 (SUTURE) ×2
SUT VIC AB 2-0 SH 27XBRD (SUTURE) ×4 IMPLANT
SUT VICRYL 0 AB UR-6 (SUTURE) ×3 IMPLANT
SUTURE DVC VLC 90 3-0 CV23 VLT (SUTURE) ×2 IMPLANT
SUTURE EHLN 3-0 FS-10 30 BLK (SUTURE) IMPLANT
SUTURE MNCRL 4-0 27XMF (SUTURE) ×4 IMPLANT
SYR 10ML LL (SYRINGE) ×3 IMPLANT
SYR BULB IRRIG 60ML STRL (SYRINGE) ×3 IMPLANT
SYRINGE IRR TOOMEY STRL 70CC (SYRINGE) ×3 IMPLANT
TAPE CLOTH 10X20 WHT NS LF (TAPE) ×2 IMPLANT
TAPE CLOTH 2X10 WHT NS LF (TAPE) ×1
TROCAR ENDOPATH XCEL 12X100 BL (ENDOMECHANICALS) ×6 IMPLANT
TROCAR XCEL 12X100 BLDLESS (ENDOMECHANICALS) ×6 IMPLANT
TROCAR XCEL NON-BLD 5MMX100MML (ENDOMECHANICALS) ×6 IMPLANT

## 2018-10-11 NOTE — Progress Notes (Signed)
   10/11/18 2100  Clinical Encounter Type  Visited With Patient  Visit Type Follow-up;Spiritual support  Referral From Chaplain  Consult/Referral To Chaplain  Spiritual Encounters  Spiritual Needs Emotional  Stress Factors  Patient Stress Factors Health changes  Chaplain follow up on patient. Patient was lying in bed waiting for nurse to bring medicine for pain. Patient was experiencing a great amount of pain at the time of this visit. Chaplain stood by beside as the patient position himself for comfort. Chaplain expressed to the patient that he only has a short period before he has his pain medicine.  Patient received a phone call and Chaplain left. Patient thanked Chaplain for keeping her word in coming to check on him.

## 2018-10-11 NOTE — Anesthesia Preprocedure Evaluation (Signed)
Anesthesia Evaluation  Patient identified by MRN, date of birth, ID band Patient awake    Reviewed: Allergy & Precautions, NPO status , Patient's Chart, lab work & pertinent test results  History of Anesthesia Complications Negative for: history of anesthetic complications  Airway Mallampati: II  TM Distance: >3 FB Neck ROM: Full    Dental no notable dental hx.    Pulmonary sleep apnea , neg COPD, Current Smoker,    breath sounds clear to auscultation- rhonchi (-) wheezing      Cardiovascular Exercise Tolerance: Good hypertension, Pt. on medications (-) CAD, (-) Past MI, (-) Cardiac Stents and (-) CABG  Rhythm:Regular Rate:Normal - Systolic murmurs and - Diastolic murmurs    Neuro/Psych neg Seizures negative neurological ROS  negative psych ROS   GI/Hepatic Neg liver ROS, GERD  ,  Endo/Other  negative endocrine ROSneg diabetes  Renal/GU negative Renal ROS     Musculoskeletal  (+) Arthritis ,   Abdominal (+) - obese,   Peds  Hematology negative hematology ROS (+)   Anesthesia Other Findings Past Medical History: No date: Arthritis No date: Cancer (HCC) No date: Hypertension   Reproductive/Obstetrics                             Anesthesia Physical Anesthesia Plan  ASA: II  Anesthesia Plan: General   Post-op Pain Management:    Induction: Intravenous  PONV Risk Score and Plan: 0 and Ondansetron  Airway Management Planned: Oral ETT  Additional Equipment:   Intra-op Plan:   Post-operative Plan: Extubation in OR  Informed Consent: I have reviewed the patients History and Physical, chart, labs and discussed the procedure including the risks, benefits and alternatives for the proposed anesthesia with the patient or authorized representative who has indicated his/her understanding and acceptance.     Dental advisory given  Plan Discussed with: CRNA and  Anesthesiologist  Anesthesia Plan Comments:         Anesthesia Quick Evaluation

## 2018-10-11 NOTE — Op Note (Signed)
10/11/18  PREOPERATIVE DIAGNOSIS: Prostate cancer.  POSTOPERATIVE DIAGNOSIS: Prostate cancer.  OPERATION PERFORMED: 1. DaVinci laparoscopic radical prostatectomy (bilateral non nerve sparing) 2 DaVinci laproscopic bilateral pelvic lymph node dissection.  SURGEON: Hollice Espy, MD  ASSISTANTS: Nickolas Madrid, MD  ANESTHESIA: General.  EBL: 50 cc  SPECIMEN: Prostate with bilateral seminal vesicals, bilateral pelvic lymph nodes, anterior fat pad.  FINDINGS: Accessory Pudendal Vessel: none  INDICATION: Pt.is a 63 year old male with Gleason 4+3 prostate cancer. Treatment options were discussed with him at length and he chose DaVinci radical prostatectomy.  Excellent baseline erections but given the extent of the disease and unfavorable intermediate risk, we elected to proceed with non-nerve sparing.  Bilateral pelvic lymph node dissection was planned due to his risk stratification.  PROCEDURE IN DETAIL: Patient was given Ancef preoperatively. He had sequential compression devices applied preoperatively for DVT prophylaxis. He was taken to the operating room where he was induced with general anesthesia. After adequate anesthesia, he was placed in the dorsal lithotomy position. His arms were draped by his side and was appropriately padded and secured to the operating room table. He was placed in the Trendelenburg position.  He was prepped and draped in sterile fashion. An 45 French Foley was placed in the bladder and instilled with 15 cc sterile water. Orogastric tube was placed. The Veress needle was passed just above the umbilicus and the abdomen was insufflated to 15 atmospheres. A 8 mm, blunt-tip trocar was placed just above the umbilicus. The zero-degree camera was passed within this and the following trocars were placed under direct vision; 8 mm robotic trocars were placed 9 cm laterally and inferiorly to the initially placed umbilical trocar. A third one was  placed 7 cm lateral to the left-sided trocar. In the corresponding position on the right side, a 12 mm trocar was placed, and then a 5 mm trocar was placed to the right and well above the umbilicus.  The robot was then docked with the robot trocar. I used the zero-degree camera. I had the hot scissors in the right hand and the left hand with the [place] bipolar and far left hand the Prograsp forceps. Initially I divided the median umbilical ligament bilaterally and the urachus and developed the space of Retzius down to pubic bone. I divided the parietal peritoneum laterally up to the vas deferens on each side. I used the pro-grasp forceps to provide cranial traction on the urachus. I cleaned off the Endopelvic fascia on each side and then divided it with the scissors laterally to the perirectal fat and medially to the puboprostatic ligaments which were divided. I then ligated the dorsal vein complex using a 60 mm vascular load stapler.   I then addressed the bladder neck with a 30-degree down lens. I identified the bladder neck by pulling on the Foley catheter. I divided the anterior bladder neck musculature until I then found the anterior bladder neck mucosa which was incised. I identified the Foley catheter within, deflated the balloon, pulled the Foley out through this opening and then using the Carter-Thomason needle with a #0-Vicryl suture, passed through The suprapubic region and pulled the suture through the eye of the Foley and then back out. This allowed me to provide upward traction on the prostate. I then divided the lateral bladder neck mucosa and the posterior bladder neck mucosa. I was well away from ureteral orifices. I divided the posterior bladder neck musculature until I identified the vas deferens. They were freed proximally, then divided. I freed  up the seminal vesicals using blunt and sharp dissection. Extremely judicious use of electrocautery was used near the  seminal vesicle tips to avoid injury to neurovascular bundle.   I then went back to the 0-degree lens. I divided the Denonvilliers fascia beneath the prostate and developed the prostate off the rectum. I then did a non-nerve sparing dissection in an extrafascial plane. I then isolated the pedicles of the prostate and placed weck clips on the pedicles of the prostate and then divided it with cold scissors. I continued to divide the neurovascular bundles off the prostate out to the apex of the prostate. At this point the prostate was freed up except for the urethra. I addressed the prostate anteriorly, divided the dorsal vein , then the anterior urethral wall, pulled the Foley catheter back and then divided the posterior urethral wall. Specimen was completely freed up. I placed the prostate in an Endo catch bag and then placed the bag in the upper abdomen out of the way. I then irrigated the pelvis. The rectal test was negative. There was reasonable hemostasis.  I then did the pelvic lymph node dissection by incising the fascia overlying the right external iliac vein, dissecting distally. I went just distal to the node of Cloquets and then divided the lymphatics using bipolar energy. The lateral aspect of the dissection was the pelvic side wall, inferior was the obturator nerve and proximal the hypogastric vessels. I placed clips at the proximal aspect and then divided the lymphatics. This was removed with the spoon grasper and sent to pathology.   I then did the left obturator lymph node dissection in the same fashion as the left side.  With good hemostasis, I then did the posterior reconstruction. I used a 3-0 VLoc suture on an RB1 through the cut edge of Denonvilliers fascia beneath the bladder on the right side and through the posterior striated sphincter underneath the urethra. This brought the bladder neck and urethra and closer proximity to help facilitate anastomosis.   I then  did the urethral vesicle anastomosis again with two 3-0 VLoc sutures on an RB1 needle interlocked. I passed both ends of the suture from the outside-in through the bladder neck at the 6 o'clock position. I passed both through the urethral stump from the inside-out in the corresponding position. I reapproximated the bladder neck to the urethra. I then ran the Left suture on the left side anastomosis to the 9 o'clock position. Then I went back to the right sided suture and ran that up the right side to the 12 o'clock position. I then continued the left suture to the 12 o'clock position.The suture was then suspended anteriorly behind the pubic bone.   I then placed a new 65 French Foley into the bladder and filled it with 10 cc sterile water. I irrigated the bladder with 160 cc. There was no leakage. There was reasonable hemostasis.  Surgicel was used on lymph node dissection fossa and Surgi-Flo around the bladder neck. The instruments were then removed. The robot was undocked and all the trocars were removed under direct vision. There was good hemostasis. I then enlarged the umbilical trocar site large enough to remove the prostate and I closed the fascia here with #0-0 Vicryl suture in a running fashion. All the port sites were irrigated. Lidocaine was injected into all the trocar sites. The skin was closed with 4-0 Monocryl in running subcuticular fashion. Dermabond was applied.   At this point patient was awakened and extubated  in the operating room and taken to the recovery room in stable condition. There were no complications. All counts correct.  Hollice Espy, MD

## 2018-10-11 NOTE — Anesthesia Procedure Notes (Signed)
Procedure Name: Intubation Date/Time: 10/11/2018 11:42 AM Performed by: Willette Alma, CRNA Pre-anesthesia Checklist: Patient identified, Patient being monitored, Timeout performed, Emergency Drugs available and Suction available Patient Re-evaluated:Patient Re-evaluated prior to induction Oxygen Delivery Method: Circle system utilized Preoxygenation: Pre-oxygenation with 100% oxygen Induction Type: IV induction Ventilation: Mask ventilation without difficulty Laryngoscope Size: McGraph and 4 Grade View: Grade I Tube type: Oral Tube size: 7.0 mm Number of attempts: 1 Airway Equipment and Method: Stylet Placement Confirmation: ETT inserted through vocal cords under direct vision,  positive ETCO2 and breath sounds checked- equal and bilateral Secured at: 21 cm Tube secured with: Tape Dental Injury: Teeth and Oropharynx as per pre-operative assessment  Comments: Atraumatic DL x 2 attempt. 1st with Mac 4, pt deep, grade 4 view. Elected to attempt with McGrath #4 with grade 1 view. Easy mask ventilation between attempts. Lips and teeth as before.

## 2018-10-11 NOTE — Progress Notes (Signed)
Pt received to room 218 via bed from PACU. Pt alert and oriented. Oriented to room. Foley catheter patent with pink, clear urine. Pt denies pain or discomfort at this time.

## 2018-10-11 NOTE — Progress Notes (Signed)
Pt tolerating clear liquid diet without complaints of nausea.

## 2018-10-11 NOTE — Anesthesia Post-op Follow-up Note (Signed)
Anesthesia QCDR form completed.        

## 2018-10-11 NOTE — Transfer of Care (Signed)
Immediate Anesthesia Transfer of Care Note  Patient: Jack Weaver  Procedure(s) Performed: ROBOTIC ASSISTED LAPAROSCOPIC RADICAL PROSTATECTOMY (N/A Abdomen) PELVIC LYMPH NODE DISSECTION (Bilateral Abdomen)  Patient Location: PACU  Anesthesia Type:General  Level of Consciousness: awake, alert  and oriented  Airway & Oxygen Therapy: Patient Spontanous Breathing and Patient connected to face mask oxygen  Post-op Assessment: Report given to RN and Post -op Vital signs reviewed and stable  Post vital signs: Reviewed and stable  Last Vitals:  Vitals Value Taken Time  BP    Temp    Pulse    Resp    SpO2      Last Pain:  Vitals:   10/11/18 0943  TempSrc: Temporal  PainSc: 0-No pain         Complications: No apparent anesthesia complications

## 2018-10-11 NOTE — Discharge Instructions (Signed)
Activity:  You are encouraged to ambulate frequently (about every hour during waking hours) to help prevent blood clots from forming in your legs or lungs.  However, you should not engage in any heavy lifting (> 5-10 lbs), strenuous activity, or straining.   Diet: You should advance your diet as instructed by your physician.  It will be normal to have some bloating, nausea, and abdominal discomfort intermittently.   Prescriptions:  You will be provided a prescription for pain medication to take as needed.  If your pain is not severe enough to require the prescription pain medication, you may take extra strength Tylenol instead which will have less side effects.  You should also take a prescribed stool softener to avoid straining with bowel movements as the prescription pain medication may constipate you.   Incisions: You may remove your dressing bandages 48 hours after surgery if not removed in the hospital.  You will either have some small staples or special tissue glue at each of the incision sites. Once the bandages are removed (if present), the incisions may stay open to air.  You may start showering (but not soaking or bathing in water) the 2nd day after surgery and the incisions simply need to be patted dry after the shower.  No additional care is needed.  What to call us about: You should call the office if you develop fever > 101 or develop persistent vomiting, redness or draining around your incision, or any other concerning symptoms.    [facility] [address], [place], [place] [address] [phone]   Indwelling Urinary Catheter Care, Adult An indwelling urinary catheter is a thin tube that is put into your bladder. The tube helps to drain pee (urine) out of your body. The tube goes in through your urethra. Your urethra is where pee comes out of your body. Your pee will come out through the catheter, then it will go into a bag (drainage  bag). Take good care of your catheter so it will work well. How to wear your catheter and bag Supplies needed  Sticky tape (adhesive tape) or a leg strap.  Alcohol wipe or soap and water (if you use tape).  A clean towel (if you use tape).  Large overnight bag.  Smaller bag (leg bag). Wearing your catheter Attach your catheter to your leg with tape or a leg strap.  Make sure the catheter is not pulled tight.  If a leg strap gets wet, take it off and put on a dry strap.  If you use tape to hold the bag on your leg: 1. Use an alcohol wipe or soap and water to wash your skin where the tape made it sticky before. 2. Use a clean towel to pat-dry that skin. 3. Use new tape to make the bag stay on your leg. Wearing your bags You should have been given a large overnight bag.  You may wear the overnight bag in the day or night.  Always have the overnight bag lower than your bladder.  Do not let the bag touch the floor.  Before you go to sleep, put a clean plastic bag in a wastebasket. Then hang the overnight bag inside the wastebasket. You should also have a smaller leg bag that fits under your clothes.  Always wear the leg bag below your knee.  Do not wear your leg bag at night. How to care for your skin and catheter Supplies needed  A clean washcloth.  Water and mild soap.  A clean towel. Caring for your skin and catheter      Clean the skin around your catheter every day: ? Wash your hands with soap and water. ? Wet a clean washcloth in warm water and mild soap. ? Clean the skin around your urethra. ? If you are male: ? Gently spread the folds of skin around your vagina (labia). ? With the washcloth in your other hand, wipe the inner side of your labia on each side. Wipe from front to back. ? If you are male: ? Pull back any skin that covers the end of your penis (foreskin). ? With the washcloth in your other hand, wipe your penis in small circles. Start wiping  at the tip of your penis, then move away from the catheter. ? With your free hand, hold the catheter close to where it goes into your body. ? Keep holding the catheter during cleaning so it does not get pulled out. ? With the washcloth in your other hand, clean the catheter. ? Only wipe downward on the catheter. ? Do not wipe upward toward your body. Doing this may push germs into your urethra and cause infection. ? Use a clean towel to pat-dry the catheter and the skin around it. Make sure to wipe off all soap. ? Wash your hands with soap and water.  Shower every day. Do not take baths.  Do not use cream, ointment, or lotion on the area where the catheter goes into your body, unless your doctor tells you to.  Do not use powders, sprays, or lotions on your genital area.  Check your skin around the catheter every day for signs of infection. Check for: ? Redness, swelling, or pain. ? Fluid or blood. ? Warmth. ? Pus or a bad smell. How to empty the bag Supplies needed  Rubbing alcohol.  Gauze pad or cotton ball.  Tape or a leg strap. Emptying the bag Pour the pee out of your bag when it is ?- full, or at least 2-3 times a day. Do this for your overnight bag and your leg bag. 1. Wash your hands with soap and water. 2. Separate (detach) the bag from your leg. 3. Hold the bag over the toilet or a clean pail. Keep the bag lower than your hips and bladder. This is so the pee (urine) does not go back into the tube. 4. Open the pour spout. It is at the bottom of the bag. 5. Empty the pee into the toilet or pail. Do not let the pour spout touch any surface. 6. Put rubbing alcohol on a gauze pad or cotton ball. 7. Use the gauze pad or cotton ball to clean the pour spout. 8. Close the pour spout. 9. Attach the bag to your leg with tape or a leg strap. 10. Wash your hands with soap and water. Follow instructions for cleaning the drainage bag:  From the product maker.  As told by your  doctor. How to change the bag Supplies needed  Alcohol wipes.  A clean bag.  Tape or a leg strap. Changing the bag Replace your bag with a clean bag once a month. If it starts to leak, smell bad, or look dirty, change it sooner. 1. Wash your hands with soap and water. 2. Separate the dirty bag from your leg. 3. Pinch the catheter with your fingers so that pee does not spill out. 4. Separate the catheter tube from the bag tube where  these tubes connect (at the connection valve). Do not let the tubes touch any surface. 5. Clean the end of the catheter tube with an alcohol wipe. Use a different alcohol wipe to clean the end of the bag tube. 6. Connect the catheter tube to the tube of the clean bag. 7. Attach the clean bag to your leg with tape or a leg strap. Do not make the bag tight on your leg. 8. Wash your hands with soap and water. General rules   Never pull on your catheter. Never try to take it out. Doing that can hurt you.  Always wash your hands before and after you touch your catheter or bag. Use a mild, fragrance-free soap. If you do not have soap and water, use hand sanitizer.  Always make sure there are no twists or bends (kinks) in the catheter tube.  Always make sure there are no leaks in the catheter or bag.  Drink enough fluid to keep your pee pale yellow.  Do not take baths, swim, or use a hot tub.  If you are male, wipe from front to back after you poop (have a bowel movement). Contact a doctor if:  Your pee is cloudy.  Your pee smells worse than usual.  Your catheter gets clogged.  Your catheter leaks.  Your bladder feels full. Get help right away if:  You have redness, swelling, or pain where the catheter goes into your body.  You have fluid, blood, pus, or a bad smell coming from the area where the catheter goes into your body.  Your skin feels warm where the catheter goes into your body.  You have a fever.  You have pain in your: ? Belly  (abdomen). ? Legs. ? Lower back. ? Bladder.  You see blood in the catheter.  Your pee is pink or red.  You feel sick to your stomach (nauseous).  You throw up (vomit).  You have chills.  Your pee is not draining into the bag.  Your catheter gets pulled out. Summary  An indwelling urinary catheter is a thin tube that is placed into the bladder to help drain pee (urine) out of the body.  The catheter is placed into the part of the body that drains pee from the bladder (urethra).  Taking good care of your catheter will keep it working properly and help prevent problems.  Always wash your hands before and after touching your catheter or bag.  Never pull on your catheter or try to take it out. This information is not intended to replace advice given to you by your health care provider. Make sure you discuss any questions you have with your health care provider. Document Released: 09/06/2012 Document Revised: 11/02/2017 Document Reviewed: 12/26/2016 Elsevier Interactive Patient Education  2019 [employer].

## 2018-10-11 NOTE — Progress Notes (Signed)
   10/11/18 1800  Clinical Encounter Type  Visited With Patient  Visit Type Initial;Spiritual support  Referral From Nurse  Consult/Referral To Chaplain  Spiritual Encounters  Spiritual Needs Prayer;Emotional;Other (Comment)  Stress Factors  Patient Stress Factors Health changes  Chaplain received page for patient wanting to talk to a Chaplain. Chaplain arrived and introduced herself. Patient lying in bed. Patient express to Chaplain his concerned about his surgery and he and his wife coping with his healing process. Chaplain gave encouraging words of comfort and ways that he could view his down time while he healed. Patient showed some discomfort while talking. Chaplain prayed with patient and will follow up later.

## 2018-10-11 NOTE — H&P (Signed)
10/11/18  Jack Weaver [birth year] [id number]  Referring provider: [name], MD [address] [place],  [place] [address]      Chief Complaint  Patient presents with  . Prostate Cancer    Follow up    HPI: Jack Weaver is a 63 yo M presents today for the evaluation and management of a history of prostate cancer.   Prostate cancer history:  Initially, he has been trending upward since 2018. He ultimately elected to undergo prostate biopsy on 06/29/2017 which showed intermediate risk prostate cancer, Gleason 4+3 involving a single core at the right base (73%), 3+4 at the right lateral base and right mid gland as well as 4 additional cores of Gleason 3+3 primarily on the right with a single core at the left apex. Overall, he has 7 of 12 cores involved. Rectal examunremarkable, T1c.  TRUS vol 53.8 g  He has had an appendectomy(lap).  After meeting with Dr. Baruch Gouty at radiation oncology, he is still undecided with treatment plan regarding radiation vs prostatectomy.   In the interim, CT of the pelvis shows no evidence of metastatic disease.  PMH:     Past Medical History:  Diagnosis Date  . Arthritis   . Cancer ([place])   . Hypertension     Surgical History:      Past Surgical History:  Procedure Laterality Date  . APPENDECTOMY     At age 48  . HEMORROIDECTOMY  1930  . QUADRICEPS TENDON REPAIR Left 09/09/2017   Procedure: REPAIR QUADRICEP TENDON;  Surgeon: Earnestine Leys, MD;  Location: [facility] ORS;  Service: Orthopedics;  Laterality: Left;    Home Medications:  Allergies as of 08/03/2018   No Known Allergies        Medication List       Accurate as of August 03, 2018  8:40 PM. Always use your most recent med list.        acetaminophen 500 MG tablet Commonly known as:  TYLENOL Take 1,000 mg by mouth every 8 (eight) hours as needed for mild pain or moderate pain.   carisoprodol 350 MG tablet Commonly  known as:  Soma Take 1 tablet (350 mg total) by mouth 3 (three) times daily as needed.   diclofenac sodium 1 % Gel Commonly known as:  VOLTAREN Use up to 4 times a day to the affected area as needed for pain.   diltiazem 240 MG 24 hr capsule Commonly known as:  CARDIZEM CD TAKE 1 CAPSULE BY MOUTH EVERY DAY   fluticasone 50 MCG/ACT nasal spray Commonly known as:  FLONASE USE 2 SPRAYS EACH NOSTRIL EVERY DAY   lisinopril 40 MG tablet Commonly known as:  PRINIVIL,ZESTRIL TAKE 1 TABLET BY MOUTH DAILY   LORazepam 0.5 MG tablet Commonly known as:  Ativan Take 1 tablet (0.5 mg total) by mouth every 8 (eight) hours.   multivitamin with minerals Tabs tablet Take 1 tablet by mouth daily.   omeprazole 40 MG capsule Commonly known as:  PriLOSEC Take 1 capsule (40 mg total) by mouth daily.   traMADol 50 MG tablet Commonly known as:  Ultram Take 1 tablet (50 mg total) by mouth every 6 (six) hours as needed.       Allergies: No Known Allergies  Family History:      Family History  Problem Relation Age of Onset  . Heart attack Mother   . Stroke Father   . Asthma Father   . Cancer Brother   .  Diabetes Brother   . Prostate cancer Neg Hx   . Kidney cancer Neg Hx   . Bladder Cancer Neg Hx     Social History:  reports that he has been smoking cigars. He has never used smokeless tobacco. He reports current alcohol use. He reports that he does not use drugs.  ROS: UROLOGY Frequent Urination?: No Hard to postpone urination?: Yes Burning/pain with urination?: No Get up at night to urinate?: No Leakage of urine?: Yes Urine stream starts and stops?: No Trouble starting stream?: No Do you have to strain to urinate?: No Blood in urine?: No Urinary tract infection?: No Sexually transmitted disease?: No Injury to kidneys or bladder?: No Painful intercourse?: No Weak stream?: No Erection problems?: No Penile pain?: No  Gastrointestinal Nausea?: No  Vomiting?: No Indigestion/heartburn?: No Diarrhea?: No Constipation?: No  Constitutional Fever: No Night sweats?: No Weight loss?: No Fatigue?: No  Skin Skin rash/lesions?: No Itching?: No  Eyes Blurred vision?: No Double vision?: No  Ears/Nose/Throat Sore throat?: No Sinus problems?: No  Hematologic/Lymphatic Swollen glands?: No Easy bruising?: No  Cardiovascular Leg swelling?: No Chest pain?: No  Respiratory Cough?: No Shortness of breath?: No  Endocrine Excessive thirst?: No  Musculoskeletal Back pain?: No Joint pain?: No  Neurological Headaches?: No Dizziness?: No  Psychologic Depression?: No Anxiety?: No  Physical Exam: BP (!) 153/85   Pulse 62   Ht 5\' 11"  (1.803 m)   Wt 219 lb (99.3 kg)   BMI 30.54 kg/m   Constitutional:  Alert and oriented, No acute distress.  Accompanied by his wife today.  Anxious at times. HEENT: [place] AT, moist mucus membranes.  Trachea midline, no masses. Cardiovascular: No clubbing, cyanosis, or edema. RRR. Respiratory: Normal respiratory effort, no increased work of breathing. CTAB. Skin: No rashes, bruises or suspicious lesions. Neurologic: Grossly intact, no focal deficits, moving all 4 extremities. Psychiatric: Normal mood and affect.  Pertinent Imaging: CLINICAL DATA: Newly diagnosed prostate cancer  EXAM: CT PELVIS WITH CONTRAST  TECHNIQUE: Multidetector CT imaging of the pelvis was performed using the standard protocol following the bolus administration of intravenous contrast.  CONTRAST: 169mL OMNIPAQUE IOHEXOL 300 MG/ML SOLN  COMPARISON: CT abdomen/pelvis dated 01/06/2018  FINDINGS: Urinary Tract: Bladder is within normal limits.  Bowel: Sigmoid diverticulosis, without evidence of diverticulitis.  Vascular/Lymphatic: Atherosclerotic calcifications the abdominal aorta and branch vessels.  No suspicious pelvic lymphadenopathy.  Reproductive: Prostate is grossly  unremarkable on CT, in this patient with known prostate cancer.  Other: No pelvic ascites.  Musculoskeletal: Mild degenerative changes the lower lumbar spine. No focal osseous lesions.  IMPRESSION: No findings suspicious for metastatic disease in this patient with newly diagnosed prostate cancer.  Please note that the abdomen was not imaged.   Electronically Signed By: Julian Hy M.D. On: 07/26/2018 14:01  I have personally reviewed the images and agree with radiologist interpretation.   Assessment & Plan:    1. Prostate cancer  63 year old male with newly diagnosed Gleason 4+3 prostate cancer, cT1c,primarily on the right. He falls into the intermediate risk category per NCCN guidelines, unfavorable. CT of pelvis w contrast showed no evidence of metastatic disease  Pt undecided on treatment plan regarding radiation or prostatectomy  Discussed the pros and cons of radiation vs prostatectomy Explained to pt the reoccurance rate for both are the same and if cancer reoccurs after radiation, surgery is not an option.  Explained that after surgery an overnight hospital stay is required and a catheter will be placed for 7 days. Will  potentially have urinary leakage after catheter removal and informed that pt cannot lift weights for 4-6 weeks due to possible hernia.  We discussed, in detail, the risks and expectations of surgery with regard to cancer control, urinary control, and erectile dysfunction as well as expected post operative cover he processed. Additional risks of surgery including but not limalited to bleeding, infection, hernia formation, nerve damage, steel formation, bowel/rect injury, potentially necessitating colostomy, damage to the urinary tract resulting in urinary leakage, urethral stricture, and cardiopulmonary risk such as myocardial infarction, stroke, death, thromboembolism etc. were explained. Referral to PT prior to prostatectomy recommended  for pelvic floor exercises for faster recovery process post surgery.  Patient is agreeable to prostatectomy w/ bilateral pelvic lymph node dissection, plan for non-sparing procedure given extent of disease  [facility] [address], [place], [place] [address] [phone]

## 2018-10-11 NOTE — Discharge Summary (Signed)
Date of admission: 10/11/2018  Date of discharge: 10/12/2018  Admission diagnosis: Prostate cancer  Discharge diagnosis: Prostate cancer  Secondary diagnoses:  Patient Active Problem List   Diagnosis Date Noted  . Prostate cancer ([place]) 10/11/2018  . Lumbar sprain 09/04/2017  . Traumatic rupture of quadriceps tendon 09/02/2017  . Bursitis of hip 05/31/2015  . ED (erectile dysfunction) of organic origin 11/18/2014  . BP (high blood pressure) 11/18/2014  . Neuropathy 11/18/2014  . Allergic rhinitis 11/14/2014  . Arthropathia 11/26/2009  . History of tobacco use 07/31/2008  . Acid reflux 07/26/2008  . Arthritis, degenerative 07/26/2008  . Apnea, sleep 06/30/2008    History and Physical: For full details, please see admission history and physical. Briefly, Jack Weaver is a 63 y.o. year old patient with unfavorable any rate at risk prostate cancer presenting today for bilateral pelvic lymph node dissection/robotic prostatectomy.   Hospital Course: Patient tolerated the procedure well.  He was then transferred to the floor after an uneventful PACU stay.  His hospital course was uncomplicated.  On POD#1 he had met discharge criteria: was eating a regular diet, was up and ambulating independently,  pain was well controlled, underwent catheter teaching, and was ready to for discharge.  Physical Exam Vitals signs and nursing note reviewed.  Constitutional:      Appearance: Normal appearance.  HENT:     Head: Normocephalic and atraumatic.  Cardiovascular:     Rate and Rhythm: Normal rate.  Pulmonary:     Effort: Pulmonary effort is normal.  Abdominal:     General: Abdomen is flat. There is no distension.     Palpations: Abdomen is soft.  Genitourinary:    Penis: Normal.      Comments: Foley in place draining clear yellow urine Neurological:     General: No focal deficit present.     Mental Status: He is alert and oriented to person, place, and time.  Psychiatric:        Mood  and Affect: Mood normal.        Behavior: Behavior normal.     Laboratory values:  Recent Labs    10/12/18 0316  WBC 10.0  HGB 13.8  HCT 41.6   Recent Labs    10/12/18 0316  NA 141  K 3.8  CL 111  CO2 23  GLUCOSE 129*  BUN 11  CREATININE 0.86  CALCIUM 8.9   No results for input(s): LABPT, INR in the last 72 hours. No results for input(s): LABURIN in the last 72 hours. Results for orders placed or performed during the hospital encounter of 10/07/18  Novel Coronavirus, NAA (hospital order; send-out to ref lab)     Status: None   Collection Time: 10/07/18 12:03 PM  Result Value Ref Range Status   SARS-CoV-2, NAA NOT DETECTED NOT DETECTED Final    Comment: (NOTE) This test was developed and its performance characteristics determined by [employer]. This test has not been FDA cleared or approved. This test has been authorized by FDA under an Emergency Use Authorization (EUA). This test is only authorized for the duration of time the declaration that circumstances exist justifying the authorization of the emergency use of in vitro diagnostic tests for detection of SARS-CoV-2 virus and/or diagnosis of COVID-19 infection under section 564(b)(1) of the Act, 21 U.S.C. 103PRX-4(V)(8), unless the authorization is terminated or revoked sooner. When diagnostic testing is negative, the possibility of a false negative result should be considered in the context of a patient's recent exposures  and the presence of clinical signs and symptoms consistent with COVID-19. An individual without symptoms of COVID-19 and who is not shedding SARS-CoV-2 virus would expect to have a negative (not detected) result in this assay. Performed  At: [facility] [address] [place], [place] [id number] Rush Farmer MD LK:[id number]    [place]  Final    Comment: Performed at [facility], [address] [place], [place] [address]  Urine  culture     Status: None   Collection Time: 10/07/18 12:10 PM  Result Value Ref Range Status   Specimen Description   Final    URINE, CLEAN CATCH Performed at [facility], [address], [place], [place] [address]    Special Requests   Final    NONE Performed at [facility], [address] [place], [place] [address]    Culture   Final    NO GROWTH Performed at [facility], [place] [address] [place], [place] [address]    Report Status 10/08/2018 FINAL  Final    Disposition: Home   Discharge instruction: Activity:  You are encouraged to ambulate frequently (about every hour during waking hours) to help prevent blood clots from forming in your legs or lungs.  However, you should not engage in any heavy lifting (> 5-10 lbs), strenuous activity, or straining.   Diet: You should advance your diet as instructed by your physician.  It will be normal to have some bloating, nausea, and abdominal discomfort intermittently.   Prescriptions:  You will be provided a prescription for pain medication to take as needed.  If your pain is not severe enough to require the prescription pain medication, you may take extra strength Tylenol instead which will have less side effects.  You should also take a prescribed stool softener to avoid straining with bowel movements as the prescription pain medication may constipate you.   Incisions: You may remove your dressing bandages 48 hours after surgery if not removed in the hospital.  You will either have some small staples or special tissue glue at each of the incision sites. Once the bandages are removed (if present), the incisions may stay open to air.  You may start showering (but not soaking or bathing in water) the 2nd day after surgery and the incisions simply need to be patted dry after the shower.  No additional care is needed.  What to call us about: You should call the office if you develop fever > 101 or develop  persistent vomiting, redness or draining around your incision, or any other concerning symptoms.    [facility] [address], [place], [place] [address] [phone]    Discharge medications:  Allergies as of 10/12/2018   No Known Allergies     Medication List    TAKE these medications   acetaminophen 500 MG tablet Commonly known as:  TYLENOL Take 1,000 mg by mouth every 8 (eight) hours as needed for mild pain or moderate pain.   carisoprodol 350 MG tablet Commonly known as:  Soma Take 1 tablet (350 mg total) by mouth 3 (three) times daily as needed.   diclofenac sodium 1 % Gel Commonly known as:  VOLTAREN Use up to 4 times a day to the affected area as needed for pain.   diltiazem 240 MG 24 hr capsule Commonly known as:  CARDIZEM CD TAKE 1 CAPSULE BY MOUTH EVERY DAY What changed:  how much to take  docusate sodium 100 MG capsule Commonly known as:  COLACE Take 1 capsule (100 mg total) by mouth 2 (two) times daily.   fluticasone 50 MCG/ACT nasal spray Commonly known as:  FLONASE USE 2 SPRAYS EACH NOSTRIL EVERY DAY What changed:  See the new instructions.   lisinopril 40 MG tablet Commonly known as:  ZESTRIL Take 1 tablet (40 mg total) by mouth daily.   LORazepam 0.5 MG tablet Commonly known as:  Ativan Take 1 tablet (0.5 mg total) by mouth every 8 (eight) hours.   multivitamin with minerals Tabs tablet Take 1 tablet by mouth daily.   omeprazole 40 MG capsule Commonly known as:  PriLOSEC Take 1 capsule (40 mg total) by mouth daily. What changed:    when to take this  reasons to take this   oxybutynin 5 MG tablet Commonly known as:  DITROPAN Take 1 tablet (5 mg total) by mouth every 8 (eight) hours as needed for bladder spasms.   oxyCODONE-acetaminophen 5-325 MG tablet Commonly known as:  Percocet Take 1-2 tablets by mouth every 4 (four) hours as needed for moderate pain or severe pain.   traMADol 50 MG  tablet Commonly known as:  Ultram Take 1 tablet (50 mg total) by mouth every 6 (six) hours as needed.       Followup:  Follow-up Information    Hollice Espy, MD In 1 week.   Specialty:  Urology Why:  foley removal and see MD in 4 weeks Contact information: [place]  [id number] [phone]

## 2018-10-12 ENCOUNTER — Encounter: Payer: Self-pay | Admitting: Urology

## 2018-10-12 ENCOUNTER — Ambulatory Visit: Payer: 59 | Admitting: Urology

## 2018-10-12 DIAGNOSIS — Z79899 Other long term (current) drug therapy: Secondary | ICD-10-CM | POA: Diagnosis not present

## 2018-10-12 DIAGNOSIS — Z8546 Personal history of malignant neoplasm of prostate: Secondary | ICD-10-CM | POA: Diagnosis not present

## 2018-10-12 DIAGNOSIS — Z9079 Acquired absence of other genital organ(s): Secondary | ICD-10-CM | POA: Diagnosis present

## 2018-10-12 DIAGNOSIS — E876 Hypokalemia: Secondary | ICD-10-CM | POA: Diagnosis not present

## 2018-10-12 DIAGNOSIS — Z8249 Family history of ischemic heart disease and other diseases of the circulatory system: Secondary | ICD-10-CM | POA: Diagnosis not present

## 2018-10-12 DIAGNOSIS — K567 Ileus, unspecified: Secondary | ICD-10-CM | POA: Diagnosis present

## 2018-10-12 DIAGNOSIS — R9431 Abnormal electrocardiogram [ECG] [EKG]: Secondary | ICD-10-CM | POA: Diagnosis present

## 2018-10-12 DIAGNOSIS — F17290 Nicotine dependence, other tobacco product, uncomplicated: Secondary | ICD-10-CM | POA: Diagnosis not present

## 2018-10-12 DIAGNOSIS — N179 Acute kidney failure, unspecified: Secondary | ICD-10-CM | POA: Diagnosis not present

## 2018-10-12 DIAGNOSIS — M25512 Pain in left shoulder: Secondary | ICD-10-CM | POA: Diagnosis not present

## 2018-10-12 DIAGNOSIS — I10 Essential (primary) hypertension: Secondary | ICD-10-CM | POA: Diagnosis not present

## 2018-10-12 DIAGNOSIS — Z1159 Encounter for screening for other viral diseases: Secondary | ICD-10-CM | POA: Diagnosis not present

## 2018-10-12 LAB — CBC
HCT: 41.6 % (ref 39.0–52.0)
Hemoglobin: 13.8 g/dL (ref 13.0–17.0)
MCH: 27.8 pg (ref 26.0–34.0)
MCHC: 33.2 g/dL (ref 30.0–36.0)
MCV: 83.9 fL (ref 80.0–100.0)
Platelets: 201 10*3/uL (ref 150–400)
RBC: 4.96 MIL/uL (ref 4.22–5.81)
RDW: 13.1 % (ref 11.5–15.5)
WBC: 10 10*3/uL (ref 4.0–10.5)
nRBC: 0 % (ref 0.0–0.2)

## 2018-10-12 LAB — BASIC METABOLIC PANEL
Anion gap: 7 (ref 5–15)
BUN: 11 mg/dL (ref 8–23)
CO2: 23 mmol/L (ref 22–32)
Calcium: 8.9 mg/dL (ref 8.9–10.3)
Chloride: 111 mmol/L (ref 98–111)
Creatinine, Ser: 0.86 mg/dL (ref 0.61–1.24)
GFR calc Af Amer: >60 mL/min (ref >60)
GFR calc non Af Amer: >60 mL/min (ref >60)
Glucose, Bld: 129 mg/dL — ABNORMAL HIGH (ref 70–99)
Potassium: 3.8 mmol/L (ref 3.5–5.1)
Sodium: 141 mmol/L (ref 135–145)

## 2018-10-12 MED ORDER — HYDRALAZINE HCL 20 MG/ML IJ SOLN
10.0000 mg | Freq: Four times a day (QID) | INTRAMUSCULAR | Status: DC | PRN
  Administered 2018-10-12: 10 mg via INTRAVENOUS
  Filled 2018-10-12: qty 1

## 2018-10-12 NOTE — Progress Notes (Signed)
Dr. Diamantina Providence notified of sustained elevated BP; acknowledged; ordered to give AM BP meds at this time and he will come by after present OR case and assess patient. Barbaraann Faster, RN 5:21 AM 10/12/2018

## 2018-10-12 NOTE — Anesthesia Postprocedure Evaluation (Signed)
Anesthesia Post Note  Patient: Jack Weaver  Procedure(s) Performed: ROBOTIC ASSISTED LAPAROSCOPIC RADICAL PROSTATECTOMY (N/A Abdomen) PELVIC LYMPH NODE DISSECTION (Bilateral Abdomen)  Patient location during evaluation: PACU Anesthesia Type: General Level of consciousness: awake and alert Pain management: pain level controlled Vital Signs Assessment: post-procedure vital signs reviewed and stable Respiratory status: spontaneous breathing, nonlabored ventilation and respiratory function stable Cardiovascular status: blood pressure returned to baseline and stable Postop Assessment: no apparent nausea or vomiting Anesthetic complications: no     Last Vitals:  Vitals:   10/12/18 0512 10/12/18 0658  BP: (!) 156/98 (!) 176/98  Pulse: 63 (!) 58  Resp: 18   Temp: 37.1 C   SpO2: 100%     Last Pain:  Vitals:   10/12/18 0825  TempSrc:   PainSc: [name]

## 2018-10-12 NOTE — Progress Notes (Signed)
   10/12/18 0800  Clinical Encounter Type  Visited With Patient  Visit Type Follow-up;Spiritual support  Referral From Chaplain  Consult/Referral To Chaplain  Spiritual Encounters  Spiritual Needs Prayer;Emotional  Stress Factors  Patient Stress Factors Health changes  Chaplain visit patient and patient was preparing to get out of bed. Chaplain left a prayer blanket with patient to comfort him through his process of healing. Patient was so grateful especially with the pain he has been enduring.

## 2018-10-12 NOTE — Progress Notes (Signed)
Pt discharged per MD order. IV removed.Discharge instructions reviewed with pt. Pt switched to leg bag. Pt given overnight catheter bag and leg strap. RN explained to pt how to change bags and how to empty them. Pt verbalized understanding and all questions answered to pt satisfaction. Pt taken downstairs in wheelchair by staff.

## 2018-10-13 DIAGNOSIS — C61 Malignant neoplasm of prostate: Secondary | ICD-10-CM | POA: Diagnosis not present

## 2018-10-14 ENCOUNTER — Other Ambulatory Visit: Payer: Self-pay | Admitting: Anatomic Pathology & Clinical Pathology

## 2018-10-14 LAB — SURGICAL PATHOLOGY

## 2018-10-15 ENCOUNTER — Other Ambulatory Visit: Payer: Self-pay

## 2018-10-15 ENCOUNTER — Encounter: Payer: Self-pay | Admitting: Urology

## 2018-10-15 ENCOUNTER — Observation Stay
Admission: EM | Admit: 2018-10-15 | Discharge: 2018-10-18 | Disposition: A | Discharge status: Home / Self Care | Payer: 59 | Attending: Internal Medicine | Admitting: Internal Medicine

## 2018-10-15 ENCOUNTER — Emergency Department: Payer: 59

## 2018-10-15 ENCOUNTER — Telehealth: Payer: Self-pay | Admitting: Urology

## 2018-10-15 ENCOUNTER — Encounter: Payer: Self-pay | Admitting: Emergency Medicine

## 2018-10-15 DIAGNOSIS — R9431 Abnormal electrocardiogram [ECG] [EKG]: Secondary | ICD-10-CM

## 2018-10-15 DIAGNOSIS — Z8249 Family history of ischemic heart disease and other diseases of the circulatory system: Secondary | ICD-10-CM | POA: Insufficient documentation

## 2018-10-15 DIAGNOSIS — E876 Hypokalemia: Secondary | ICD-10-CM | POA: Insufficient documentation

## 2018-10-15 DIAGNOSIS — R1114 Bilious vomiting: Secondary | ICD-10-CM

## 2018-10-15 DIAGNOSIS — N179 Acute kidney failure, unspecified: Secondary | ICD-10-CM | POA: Insufficient documentation

## 2018-10-15 DIAGNOSIS — F17290 Nicotine dependence, other tobacco product, uncomplicated: Secondary | ICD-10-CM | POA: Insufficient documentation

## 2018-10-15 DIAGNOSIS — Z1159 Encounter for screening for other viral diseases: Secondary | ICD-10-CM | POA: Insufficient documentation

## 2018-10-15 DIAGNOSIS — M25512 Pain in left shoulder: Secondary | ICD-10-CM | POA: Insufficient documentation

## 2018-10-15 DIAGNOSIS — I10 Essential (primary) hypertension: Secondary | ICD-10-CM | POA: Insufficient documentation

## 2018-10-15 DIAGNOSIS — Z8546 Personal history of malignant neoplasm of prostate: Secondary | ICD-10-CM | POA: Insufficient documentation

## 2018-10-15 DIAGNOSIS — K567 Ileus, unspecified: Principal | ICD-10-CM | POA: Diagnosis present

## 2018-10-15 DIAGNOSIS — K9189 Other postprocedural complications and disorders of digestive system: Secondary | ICD-10-CM | POA: Diagnosis present

## 2018-10-15 DIAGNOSIS — R1031 Right lower quadrant pain: Secondary | ICD-10-CM | POA: Diagnosis not present

## 2018-10-15 DIAGNOSIS — Z9079 Acquired absence of other genital organ(s): Secondary | ICD-10-CM | POA: Insufficient documentation

## 2018-10-15 DIAGNOSIS — Z79899 Other long term (current) drug therapy: Secondary | ICD-10-CM | POA: Insufficient documentation

## 2018-10-15 LAB — COMPREHENSIVE METABOLIC PANEL
ALT: 23 U/L (ref 0–44)
AST: 27 U/L (ref 15–41)
Albumin: 4.3 g/dL (ref 3.5–5.0)
Alkaline Phosphatase: 80 U/L (ref 38–126)
Anion gap: 13 (ref 5–15)
BUN: 24 mg/dL — ABNORMAL HIGH (ref 8–23)
CO2: 24 mmol/L (ref 22–32)
Calcium: 9.6 mg/dL (ref 8.9–10.3)
Chloride: 101 mmol/L (ref 98–111)
Creatinine, Ser: 1.4 mg/dL — ABNORMAL HIGH (ref 0.61–1.24)
GFR calc Af Amer: >60 mL/min (ref >60)
GFR calc non Af Amer: 52 mL/min — ABNORMAL LOW (ref >60)
Glucose, Bld: 155 mg/dL — ABNORMAL HIGH (ref 70–99)
Potassium: 3.3 mmol/L — ABNORMAL LOW (ref 3.5–5.1)
Sodium: 138 mmol/L (ref 135–145)
Total Bilirubin: 1.3 mg/dL — ABNORMAL HIGH (ref 0.3–1.2)
Total Protein: 7.9 g/dL (ref 6.5–8.1)

## 2018-10-15 LAB — CBC
HCT: 46.3 % (ref 39.0–52.0)
Hemoglobin: 15.7 g/dL (ref 13.0–17.0)
MCH: 27.9 pg (ref 26.0–34.0)
MCHC: 33.9 g/dL (ref 30.0–36.0)
MCV: 82.4 fL (ref 80.0–100.0)
Platelets: 275 10*3/uL (ref 150–400)
RBC: 5.62 MIL/uL (ref 4.22–5.81)
RDW: 12.6 % (ref 11.5–15.5)
WBC: 7.3 10*3/uL (ref 4.0–10.5)
nRBC: 0 % (ref 0.0–0.2)

## 2018-10-15 LAB — LIPASE, BLOOD: Lipase: 23 U/L (ref 11–51)

## 2018-10-15 LAB — SARS CORONAVIRUS 2 BY RT PCR (HOSPITAL ORDER, PERFORMED IN ~~LOC~~ HOSPITAL LAB): SARS Coronavirus 2: NEGATIVE

## 2018-10-15 LAB — TROPONIN I: Troponin I: <0.03 ng/mL (ref <0.03)

## 2018-10-15 MED ORDER — FENTANYL CITRATE (PF) 100 MCG/2ML IJ SOLN
INTRAMUSCULAR | Status: AC
  Filled 2018-10-15: qty 2

## 2018-10-15 MED ORDER — ONDANSETRON HCL 4 MG/2ML IJ SOLN
4.0000 mg | Freq: Once | INTRAMUSCULAR | Status: AC | PRN
  Administered 2018-10-15: 4 mg via INTRAVENOUS
  Filled 2018-10-15: qty 2

## 2018-10-15 MED ORDER — FENTANYL CITRATE (PF) 100 MCG/2ML IJ SOLN
50.0000 ug | INTRAMUSCULAR | Status: DC | PRN
  Administered 2018-10-15: 50 ug via INTRAVENOUS

## 2018-10-15 MED ORDER — DEXTROSE-NACL 5-0.9 % IV SOLN
INTRAVENOUS | Status: DC
  Administered 2018-10-15 – 2018-10-16 (×2): via INTRAVENOUS

## 2018-10-15 MED ORDER — SODIUM CHLORIDE 0.9% FLUSH: 3.0000 mL | Freq: Once | INTRAVENOUS | Status: DC

## 2018-10-15 MED ORDER — FLUTICASONE PROPIONATE 50 MCG/ACT NA SUSP
2.0000 | Freq: Every day | NASAL | Status: DC | PRN
  Filled 2018-10-15: qty 16

## 2018-10-15 MED ORDER — SODIUM CHLORIDE 0.9 % IV BOLUS
1000.0000 mL | Freq: Once | INTRAVENOUS | Status: AC
  Administered 2018-10-15: 1000 mL via INTRAVENOUS

## 2018-10-15 MED ORDER — IOHEXOL 300 MG/ML  SOLN
100.0000 mL | Freq: Once | INTRAMUSCULAR | Status: AC | PRN
  Administered 2018-10-15: 100 mL via INTRAVENOUS

## 2018-10-15 MED ORDER — POTASSIUM CHLORIDE 10 MEQ/100ML IV SOLN
10.0000 meq | INTRAVENOUS | Status: AC
  Administered 2018-10-15 (×2): 10 meq via INTRAVENOUS
  Filled 2018-10-15 (×2): qty 100

## 2018-10-15 MED ORDER — ENOXAPARIN SODIUM 40 MG/0.4ML ~~LOC~~ SOLN
40.0000 mg | SUBCUTANEOUS | Status: DC
  Administered 2018-10-15 – 2018-10-16 (×2): 40 mg via SUBCUTANEOUS
  Filled 2018-10-15 (×3): qty 0.4

## 2018-10-15 NOTE — ED Triage Notes (Signed)
Pt c/o abd cramping with bile emesis, constipation since having prostate surgery on Monday.

## 2018-10-15 NOTE — Progress Notes (Signed)
Patient ID: Jack Weaver, male   DOB: 31-Aug-1955, 63 y.o.   MRN: [id number]  HPI Jack Weaver is a 63 y.o. male asked to see in consultation without Dr. Alfred Levins.  Is 4 days out after robotic prostatectomy by Dr. [name].  He states that he has significant pain after the operation consume prescribed narcotics.  He reports that today he had worsening right-sided abdominal pain that is crampy in nature.  Is intermittent and moderate in intensity.  He also experienced nausea and vomiting.  No fevers no chills.  He does have a Foley with a clear urine output.  He also reports initial constipation but today has had multiple watery BMs.  He is passing flatus. T scan personally reviewed showing evidence of diffuse dilation of the small bowel.  No free air.  No evidence of closed-loop obstruction or thickening bowel to suggest bowel injury.  There is no evidence of obstruction at any of the port sites. White count is normal and a BMP reveals an acute kidney injury with increase of creatinine and BUN. k 3.3    HPI  Past Medical History:  Diagnosis Date  . Arthritis   . Cancer ([place])   . Hypertension     Past Surgical History:  Procedure Laterality Date  . APPENDECTOMY     At age 57  . HEMORROIDECTOMY  1930  . PELVIC LYMPH NODE DISSECTION Bilateral 10/11/2018   Procedure: PELVIC LYMPH NODE DISSECTION;  Surgeon: Hollice Espy, MD;  Location: [facility] ORS;  Service: Urology;  Laterality: Bilateral;  . QUADRICEPS TENDON REPAIR Left 09/09/2017   Procedure: REPAIR QUADRICEP TENDON;  Surgeon: Earnestine Leys, MD;  Location: [facility] ORS;  Service: Orthopedics;  Laterality: Left;  . ROBOT ASSISTED LAPAROSCOPIC RADICAL PROSTATECTOMY N/A 10/11/2018   Procedure: ROBOTIC ASSISTED LAPAROSCOPIC RADICAL PROSTATECTOMY;  Surgeon: Hollice Espy, MD;  Location: [facility] ORS;  Service: Urology;  Laterality: N/A;    Family History  Problem Relation Age of Onset  . Heart attack Mother   . Stroke Father   . Asthma Father    . Cancer Brother   . Diabetes Brother   . Prostate cancer Neg Hx   . Kidney cancer Neg Hx   . Bladder Cancer Neg Hx     Social History Social History   Tobacco Use  . Smoking status: Current Some Day Smoker    Types: Cigars  . Smokeless tobacco: Never Used  . Tobacco comment: quit in 06/2013. Pt recently started 1 cigarette daily. Now smokes once a week.  Substance Use Topics  . Alcohol use: Yes    Alcohol/week: 0.0 standard drinks    Comment: Wine 5x week  . Drug use: No    No Known Allergies  Current Facility-Administered Medications  Medication Dose Route Frequency Provider Last Rate Last Dose  . fentaNYL (SUBLIMAZE) 100 MCG/2ML injection           . sodium chloride 0.9 % bolus 1,000 mL  1,000 mL Intravenous Once Pabon, Diego F, MD      . sodium chloride flush (NS) 0.9 % injection 3 mL  3 mL Intravenous Once Rudene Re, MD       Current Outpatient Medications  Medication Sig Dispense Refill  . acetaminophen (TYLENOL) 500 MG tablet Take 1,000 mg by mouth every 8 (eight) hours as needed for mild pain or moderate pain.    . carisoprodol (SOMA) 350 MG tablet Take 1 tablet (350 mg total) by mouth 3 (three) times daily as needed. (Patient not  taking: Reported on 10/05/2018) 15 tablet 0  . diclofenac sodium (VOLTAREN) 1 % GEL Use up to 4 times a day to the affected area as needed for pain. (Patient not taking: Reported on 10/05/2018) 100 g 0  . diltiazem (CARDIZEM CD) 240 MG 24 hr capsule TAKE 1 CAPSULE BY MOUTH EVERY DAY (Patient taking differently: Take 240 mg by mouth daily. ) 90 capsule 3  . docusate sodium (COLACE) 100 MG capsule Take 1 capsule (100 mg total) by mouth 2 (two) times daily. 60 capsule 0  . fluticasone (FLONASE) 50 MCG/ACT nasal spray USE 2 SPRAYS EACH NOSTRIL EVERY DAY (Patient taking differently: Place 2 sprays into both nostrils daily as needed for allergies. ) 48 g 3  . lisinopril (PRINIVIL,ZESTRIL) 40 MG tablet Take 1 tablet (40 mg total) by mouth  daily. 90 tablet 3  . LORazepam (ATIVAN) 0.5 MG tablet Take 1 tablet (0.5 mg total) by mouth every 8 (eight) hours. (Patient not taking: Reported on 10/05/2018) 4 tablet 0  . Multiple Vitamin (MULTIVITAMIN WITH MINERALS) TABS tablet Take 1 tablet by mouth daily.    Marland Kitchen omeprazole (PRILOSEC) 40 MG capsule Take 1 capsule (40 mg total) by mouth daily. (Patient taking differently: Take 40 mg by mouth daily as needed (acid reflux/indigestion). ) 30 capsule 11  . oxybutynin (DITROPAN) 5 MG tablet Take 1 tablet (5 mg total) by mouth every 8 (eight) hours as needed for bladder spasms. 30 tablet 0  . oxyCODONE-acetaminophen (PERCOCET) 5-325 MG tablet Take 1-2 tablets by mouth every 4 (four) hours as needed for moderate pain or severe pain. 10 tablet 0  . traMADol (ULTRAM) 50 MG tablet Take 1 tablet (50 mg total) by mouth every 6 (six) hours as needed. (Patient not taking: Reported on 10/05/2018) 4 tablet 0     Review of Systems Full ROS  was asked and was negative except for the information on the HPI  Physical Exam Blood pressure (!) 144/80, pulse 72, temperature 97.8 F (36.6 C), temperature source Oral, resp. rate 20, height 6' (1.829 m), weight 98.2 kg, SpO2 98 %. CONSTITUTIONAL: NAD EYES: Pupils are equal, round, and reactive to light, Sclera are non-icteric. EARS, NOSE, MOUTH AND THROAT: The oropharynx is clear. The oral mucosa is pink and moist. Hearing is intact to voice. LYMPH NODES:  Lymph nodes in the neck are normal. RESPIRATORY:  Lungs are clear. There is normal respiratory effort, with equal breath sounds bilaterally, and without pathologic use of accessory muscles. CARDIOVASCULAR: Heart is regular without murmurs, gallops, or rubs. GI: The abdomen is  soft, appropriate tenderness to palpation on port sites.  No evidence of peritonitis.  No evidence of rebound.  He does have some mild distention.  His wounds are well approximated clean dry and intact without evidence of infection. He does  have decreased bowel sounds GU: Foley w clear urine   MUSCULOSKELETAL: Normal muscle strength and tone. No cyanosis or edema.   SKIN: Turgor is good and there are no pathologic skin lesions or ulcers. NEUROLOGIC: Motor and sensation is grossly normal. Cranial nerves are grossly intact. PSYCH:  Oriented to person, place and time. Affect is normal.  Data Reviewed I have personally reviewed the patient's imaging, laboratory findings and medical records.    Assessment/Plan   63 year old male status post robotic prostatectomy postoperative day #4 now presents with clinical findings consistent with postoperative ileus.  He is abdominal exam reveals no evidence of peritonitis and his CT scan is consistent with an ileus.  There is no evidence of bowel injury or alarming signs of closed-loop obstruction or obstruction at the port site. I do recommend aggressive fluid resuscitation and electrolyte correction.  N.p.o.  Patient does not want an NG tube if at all possible.  Continue serial abdominal exams.  No need for emergent surgical intervention.  We will continue to follow him while he is an inpatient Discussed with Dr. Alfred Levins in detail  Caroleen Hamman, MD [place] [place] Surgeon 10/15/2018, 2:52 PM

## 2018-10-15 NOTE — Telephone Encounter (Signed)
Mrs. Pistilli contacted the on call services early this morning concerning her husband.  She states that he is vomiting every time he stands up, unable to eat or drink and his abdomen is bloated.  I have advised him to go to the ED.

## 2018-10-15 NOTE — ED Notes (Signed)
Off unit to CT abd.

## 2018-10-15 NOTE — ED Notes (Signed)
Foley catheter in place upon arrival to the ED draining cloudy, yellow urine.

## 2018-10-15 NOTE — ED Notes (Signed)
Troponin drawn and sent. EKG obtained and sent.

## 2018-10-15 NOTE — TOC Initial Note (Signed)
Transition of Care [facility]) - Initial/Assessment Note    Patient Details  Name: Jack Weaver MRN: [id number] Date of Birth: Jul 30, 1955  [facility] CM/SW Contact:    Shelbie Hutching, RN Phone Number: 10/15/2018, 3:43 PM  Clinical Narrative:                 Patient being seen in the ED today for ongoing abdominal pain since radical prostatectomy this past Monday.  Patient was discharged after surgery on Tuesday with foley catheter to f/u this upcoming Tuesday May 26th.  Patient will be admitted.  Patient reports he is independent at home and lives with his wife.  Patient is a Administrator.  PCP verified as Dr. Rosanna Randy and pt gets [facility] [address]  No discharge needs identified at this time.    Expected Discharge Plan: Home/Self Care Barriers to Discharge: Continued Medical Work up   Patient Goals and CMS Choice        Expected Discharge Plan and Services Expected Discharge Plan: Home/Self Care       Living arrangements for the past 2 months: Single Family Home                                      Prior Living Arrangements/Services Living arrangements for the past 2 months: Single Family Home Lives with:: Spouse Patient language and need for interpreter reviewed:: No Do you feel safe going back to the place where you live?: Yes      Need for Family Participation in Patient Care: Yes (Comment)(post op) Care giver support system in place?: Yes (comment)(lives with wife)   Criminal Activity/Legal Involvement Pertinent to Current Situation/Hospitalization: No - Comment as needed  Activities of Daily Living      Permission Sought/Granted Permission sought to share information with : Case Manager Permission granted to share information with : Yes, Verbal Permission Granted              Emotional Assessment Appearance:: Appears stated age Attitude/Demeanor/Rapport: Engaged Affect (typically observed): Accepting Orientation: :  Oriented to Self, Oriented to Place, Oriented to Situation, Oriented to  Time Alcohol / Substance Use: Not Applicable Psych Involvement: No (comment)  Admission diagnosis:  Emesis  Patient Active Problem List   Diagnosis Date Noted  . Right lower quadrant abdominal pain   . Prostate cancer ([place]) 10/11/2018  . Lumbar sprain 09/04/2017  . Traumatic rupture of quadriceps tendon 09/02/2017  . Bursitis of hip 05/31/2015  . ED (erectile dysfunction) of organic origin 11/18/2014  . BP (high blood pressure) 11/18/2014  . Neuropathy 11/18/2014  . Allergic rhinitis 11/14/2014  . Arthropathia 11/26/2009  . History of tobacco use 07/31/2008  . Acid reflux 07/26/2008  . Arthritis, degenerative 07/26/2008  . Apnea, sleep 06/30/2008   PCP:  [name], MD Pharmacy:   [id number] [address], [place] [address] Phone: [phone] Fax: [phone]     Social Determinants of Health (SDOH) Interventions    Readmission Risk Interventions No flowsheet data found.

## 2018-10-15 NOTE — ED Triage Notes (Signed)
Patient c/o lower abdominal pain.  Last medicated with oxycodone at home was this morning at around 0230.

## 2018-10-15 NOTE — Progress Notes (Signed)
Care Alignment Note  Advanced Directives Documents (Living Will, Power of Attorney) currently in the EHR no advanced directives documents available .  Has the patient discussed their wishes with their family/healthcare power of attorney no.  What does the patient/decision maker understand about their medical condition and the natural course of their disease. Marland Kitchen Postoperative ileus  What is the patien  Acute kidney injury.  Hypokalemiat/decision maker's biggest fear or concern for the future pain and suffering   What is the most important goal for this patient should their health condition worsen maintenance of function.  Current   Code Status: Full Code  Current code status has been reviewed/updated.  Time spent:21 minutes

## 2018-10-15 NOTE — ED Notes (Signed)
HIV drawn and sent. Patient incintinent of bowels with diarhea. Peri care and linen change completed/

## 2018-10-15 NOTE — ED Notes (Signed)
ED TO INPATIENT HANDOFF REPORT  ED Nurse Name and Phone #:    S Name/Age/Gender [name] 63 y.o. male Room/Bed: ED05A/ED05A  Code Status   Code Status: Full Code  Home/SNF/Other Home Patient oriented to: self, place, time and situation Is this baseline? Yes   Triage Complete: Triage complete  Chief Complaint Emesis   Triage Note Pt c/o abd cramping with bile emesis, constipation since having prostate surgery on Monday.  Patient c/o lower abdominal pain.  Last medicated with oxycodone at home was this morning at around 0230.   Allergies No Known Allergies  Level of Care/Admitting Diagnosis ED Disposition    ED Disposition Condition [facility]: [place] [[id number]]  Level of Care: Med-Surg [16]  Covid Evaluation: Screening Protocol (No Symptoms)  Diagnosis: Postoperative ileus ([place]) [[id number]]  Admitting Physician: Otila Back [place]  Attending Physician: Otila Back [[id number]]  Estimated length of stay: past midnight tomorrow  Certification:: I certify this patient will need inpatient services for at least 2 midnights  PT Class (Do Not Modify): Inpatient [101]  PT Acc Code (Do Not Modify): Private [1]       B Medical/Surgery History Past Medical History:  Diagnosis Date  . Arthritis   . Cancer ([place])   . Hypertension    Past Surgical History:  Procedure Laterality Date  . APPENDECTOMY     At age 53  . HEMORROIDECTOMY  1930  . PELVIC LYMPH NODE DISSECTION Bilateral 10/11/2018   Procedure: PELVIC LYMPH NODE DISSECTION;  Surgeon: Hollice Espy, MD;  Location: [facility] ORS;  Service: Urology;  Laterality: Bilateral;  . QUADRICEPS TENDON REPAIR Left 09/09/2017   Procedure: REPAIR QUADRICEP TENDON;  Surgeon: Earnestine Leys, MD;  Location: [facility] ORS;  Service: Orthopedics;  Laterality: Left;  . ROBOT ASSISTED LAPAROSCOPIC RADICAL PROSTATECTOMY N/A 10/11/2018   Procedure: ROBOTIC ASSISTED LAPAROSCOPIC RADICAL PROSTATECTOMY;   Surgeon: Hollice Espy, MD;  Location: [facility] ORS;  Service: Urology;  Laterality: N/A;     A IV Location/Drains/Wounds Patient Lines/Drains/Airways Status   Active Line/Drains/Airways    Name:   Placement date:   Placement time:   Site:   Days:   Peripheral IV 10/15/18 Right Forearm   10/15/18    1121    Forearm   less than 1   Urethral Catheter dr [name] Straight-tip;Double-lumen;Latex 18 Fr.   10/11/18    1317    Straight-tip;Double-lumen;Latex   4   Incision - 6 Ports Abdomen Right;Lower;Lateral Right;Lower;Medial Umbilicus Left;Lower;Lateral Lower;Left;Medial Left;Right;Upper;Mid   10/11/18    1255     4          Intake/Output Last 24 hours  Intake/Output Summary (Last 24 hours) at 10/15/2018 1721 Last data filed at 10/15/2018 1430 Gross per 24 hour  Intake 1000 ml  Output -  Net 1000 ml    Labs/Imaging Results for orders placed or performed during the hospital encounter of 10/15/18 (from the past 48 hour(s))  Lipase, blood     Status: None   Collection Time: 10/15/18 11:21 AM  Result Value Ref Range   Lipase 23 11 - 51 U/L    Comment: Performed at [facility], [address] [place], [place] [address]  Comprehensive metabolic panel     Status: Abnormal   Collection Time: 10/15/18 11:21 AM  Result Value Ref Range   Sodium 138 135 - 145 mmol/L   Potassium 3.3 (L) 3.5 - 5.1 mmol/L   Chloride 101 98 - 111 mmol/L  CO2 24 22 - 32 mmol/L   Glucose, Bld 155 (H) 70 - 99 mg/dL   BUN 24 (H) 8 - 23 mg/dL   Creatinine, Ser 1.40 (H) 0.61 - 1.24 mg/dL   Calcium 9.6 8.9 - 10.3 mg/dL   Total Protein 7.9 6.5 - 8.1 g/dL   Albumin 4.3 3.5 - 5.0 g/dL   AST 27 15 - 41 U/L   ALT 23 0 - 44 U/L   Alkaline Phosphatase 80 38 - 126 U/L   Total Bilirubin 1.3 (H) 0.3 - 1.2 mg/dL   GFR calc non Af Amer 52 (L) >60 mL/min   GFR calc Af Amer >60 >60 mL/min   Anion gap 13 5 - 15    Comment: Performed at [facility], [address] [place], [place] [address]  CBC      Status: None   Collection Time: 10/15/18 11:21 AM  Result Value Ref Range   WBC 7.3 4.0 - 10.5 K/uL   RBC 5.62 4.22 - 5.81 MIL/uL   Hemoglobin 15.7 13.0 - 17.0 g/dL   HCT 46.3 39.0 - 52.0 %   MCV 82.4 80.0 - 100.0 fL   MCH 27.9 26.0 - 34.0 pg   MCHC 33.9 30.0 - 36.0 g/dL   RDW 12.6 11.5 - 15.5 %   Platelets 275 150 - 400 K/uL   nRBC 0.0 0.0 - 0.2 %    Comment: Performed at [facility], [address] [place], [place] [address]  Troponin I - ONCE - STAT     Status: None   Collection Time: 10/15/18  1:06 PM  Result Value Ref Range   Troponin I <0.03 <0.03 ng/mL    Comment: Performed at [facility], [address] [place], [place] [address]  SARS Coronavirus 2 (CEPHEID - Performed in [place] hospital lab), Hosp Order     Status: None   Collection Time: 10/15/18  2:31 PM  Result Value Ref Range   SARS Coronavirus 2 NEGATIVE NEGATIVE    Comment: (NOTE) If result is NEGATIVE SARS-CoV-2 target nucleic acids are NOT DETECTED. The SARS-CoV-2 RNA is generally detectable in upper and lower  respiratory specimens during the acute phase of infection. The lowest  concentration of SARS-CoV-2 viral copies this assay can detect is 250  copies / mL. A negative result does not preclude SARS-CoV-2 infection  and should not be used as the sole basis for treatment or other  patient management decisions.  A negative result may occur with  improper specimen collection / handling, submission of specimen other  than nasopharyngeal swab, presence of viral mutation(s) within the  areas targeted by this assay, and inadequate number of viral copies  (<250 copies / mL). A negative result must be combined with clinical  observations, patient history, and epidemiological information. If result is POSITIVE SARS-CoV-2 target nucleic acids are DETECTED. The SARS-CoV-2 RNA is generally detectable in upper and lower  respiratory specimens dur ing the acute phase of infection.  Positive   results are indicative of active infection with SARS-CoV-2.  Clinical  correlation with patient history and other diagnostic information is  necessary to determine patient infection status.  Positive results do  not rule out bacterial infection or co-infection with other viruses. If result is PRESUMPTIVE POSTIVE SARS-CoV-2 nucleic acids MAY BE PRESENT.   A presumptive positive result was obtained on the submitted specimen  and confirmed on repeat testing.  While 2019 novel coronavirus  (SARS-CoV-2) nucleic acids may be present  in the submitted sample  additional confirmatory testing may be necessary for epidemiological  and / or clinical management purposes  to differentiate between  SARS-CoV-2 and other Sarbecovirus currently known to infect humans.  If clinically indicated additional testing with an alternate test  methodology [phone]) is advised. The SARS-CoV-2 RNA is generally  detectable in upper and lower respiratory sp ecimens during the acute  phase of infection. The expected result is Negative. Fact Sheet for Patients:  StrictlyIdeas.no Fact Sheet for Healthcare Providers: BankingDealers.co.za This test is not yet approved or cleared by the [place] FDA and has been authorized for detection and/or diagnosis of SARS-CoV-2 by FDA under an Emergency Use Authorization (EUA).  This EUA will remain in effect (meaning this test can be used) for the duration of the COVID-19 declaration under Section 564(b)(1) of the Act, 21 U.S.C. section 360bbb-3(b)(1), unless the authorization is terminated or revoked sooner. Performed at [facility], [address] [place], [place] [address]    Ct Abdomen Pelvis W Contrast  Result Date: 10/15/2018 CLINICAL DATA:  Abdominal distension and pelvic pain. Recent prostatectomy EXAM: CT ABDOMEN AND PELVIS WITH CONTRAST TECHNIQUE: Multidetector CT imaging of the abdomen and pelvis was  performed using the standard protocol following bolus administration of intravenous contrast. CONTRAST:  140mL OMNIPAQUE IOHEXOL 300 MG/ML  SOLN COMPARISON:  July 26, 2018 CT pelvis; CT abdomen and pelvis January 06, 2018 FINDINGS: Lower chest: There is bibasilar atelectasis. Hepatobiliary: There are scattered cysts throughout the liver. The largest cyst is in the region of the dome of the liver measuring 6.0 x 4.8 cm. No non cystic liver lesions are demonstrable. The gallbladder wall is not appreciably thickened. There is no biliary duct dilatation. Pancreas: There is no pancreatic mass or inflammatory focus. Spleen: No splenic lesions are evident. Adrenals/Urinary Tract: Left adrenal appears normal. There is a persistent mass arising from the right adrenal measuring 2.4 x 1.9 cm. This mass appears essentially stable compared to the previous study. Its attenuation value places this mass in an indeterminate category with respect to etiology. Kidneys bilaterally show no evident mass or hydronephrosis on either side. There is no evident renal or ureteral calculus on either side. Urinary bladder is decompressed with a Foley catheter. Stomach/Bowel: There is dilatation of most of the loops of small bowel. There is a transition zone in the distal ileal region consistent with a degree of small bowel obstruction. No free air or portal venous air is demonstrable on this study. Bowel loops do not appear significantly thickened. Moderate fluid is seen in the colon without colonic wall thickening. There are occasional colonic diverticula without diverticulitis evident. Vascular/Lymphatic: There is aortic atherosclerosis. There is dilatation of the distal abdominal aorta with a maximum transverse diameter of 3.4 x 3.4 cm. There is no periaortic fluid. There is atherosclerotic calcification in both common iliac arteries. There is no appreciable abscess in the abdomen or pelvis. Reproductive: Patient is status post prostatectomy.  There is no pelvic mass evident. Other: There is postoperative change in the anterior pelvic wall with several areas of air consistent with the recent surgery. Foci of air are also noted in the periumbilical region as well as at sites in the abdominal wall anteriorly and laterally consistent with laparoscopic procedure. No associated fluid collections in these areas. Appendix is absent. There is no periappendiceal region. There is no abscess evident in the abdomen pelvis. There is soft tissue thickening and fluid posterior to the rectum which is likely due to the  recent surgery. Musculoskeletal: There are foci of degenerative change in the thoracic spine. No blastic or lytic bone lesions are evident. No intramuscular lesions are evident. IMPRESSION: 1. Apparent distal small bowel obstruction with transition zone in the distal ileal region. Most small bowel loops are dilated. There is fluid throughout much of the bowel which may indicate a degree of superimposed ileus secondary to recent surgery. 2. No demonstrable abscess in the abdomen or pelvis. Appendix absent. No periappendiceal region inflammation. 3. Fluid posterior to the rectum, likely due to recent prostatectomy. Prostate absent. Urinary bladder is decompressed with Foley catheter. Foci of air in the abdominal and pelvic walls is secondary to recent laparoscopic procedure. 4.  No metastatic foci evident. 5. No hydronephrosis on either side. No evident renal or ureteral calculus. 6. There is aortic and iliac artery atherosclerosis. There is dilatation of the distal aorta to 3.4 x 3.4 cm. Recommend followup by ultrasound in 3 years. This recommendation follows ACR consensus guidelines: White Paper of the ACR Incidental Findings Committee II on Vascular Findings. J Am Coll Radiol 2013; 00:370-488 7. Stable right adrenal mass. Stability of this mass suggests benign etiology, although by attenuation criteria, this mass in the right adrenal must be considered  indeterminate with respect to etiology. Electronically Signed   By: Lowella Grip III M.D.   On: 10/15/2018 13:53    Pending Labs Unresulted Labs (From admission, onward)    Start     Ordered   10/16/18 0500  Comprehensive metabolic panel  Tomorrow morning,   STAT     10/15/18 1500   10/16/18 0500  Magnesium  Tomorrow morning,   STAT     10/15/18 1500   10/16/18 0500  CBC  Tomorrow morning,   STAT     10/15/18 1500   10/15/18 1553  HIV antibody (Routine Testing)  Once,   STAT     10/15/18 1556   10/15/18 1145  Urinalysis, Complete w Microscopic  Once,   STAT     10/15/18 1145          Vitals/Pain Today's Vitals   10/15/18 1530 10/15/18 1600 10/15/18 1630 10/15/18 1700  BP: (!) 152/94 133/83 131/81 140/84  Pulse:      Resp:      Temp:      TempSrc:      SpO2:      Weight:      Height:      PainSc:        Isolation Precautions No active isolations  Medications Medications  sodium chloride flush (NS) 0.9 % injection 3 mL (3 mLs Intravenous Not Given 10/15/18 1224)  fentaNYL (SUBLIMAZE) 100 MCG/2ML injection (has no administration in time range)  fluticasone (FLONASE) 50 MCG/ACT nasal spray 2 spray (has no administration in time range)  enoxaparin (LOVENOX) injection 40 mg (has no administration in time range)  dextrose 5 %-0.9 % sodium chloride infusion (has no administration in time range)  potassium chloride 10 mEq in 100 mL IVPB (10 mEq Intravenous New Bag/Given 10/15/18 1635)  ondansetron (ZOFRAN) injection 4 mg (4 mg Intravenous Given 10/15/18 1122)  sodium chloride 0.9 % bolus 1,000 mL (0 mLs Intravenous Stopped 10/15/18 1430)  iohexol (OMNIPAQUE) 300 MG/ML solution 100 mL (100 mLs Intravenous Contrast Given 10/15/18 1315)  sodium chloride 0.9 % bolus 1,000 mL (1,000 mLs Intravenous New Bag/Given 10/15/18 1453)    Mobility walks Low fall risk   Focused Assessments Prostate surgery patient    R Recommendations: See Admitting Provider  Note  Report given  to:   Additional Notes:

## 2018-10-15 NOTE — ED Notes (Signed)
Assumed care of patient reports prostate surgery, here today due to abs pain. Reports pain feels like gas rating it 10/10 when he arrived, patient reports was given fentanyl prior to assuming care, patient states pain down now to 6/10. Vss. NS bolus infusing as ordered. Awaiting further plan of care.

## 2018-10-15 NOTE — ED Notes (Signed)
Patient incontinenet of bowels, loose stools noted. Peri care and change of linen completed.

## 2018-10-15 NOTE — ED Notes (Signed)
Patient up from bed too toilet with diarrhea.

## 2018-10-15 NOTE — ED Notes (Signed)
Awaiting bed status. Patient out of bed to commode with diarrhea.

## 2018-10-15 NOTE — ED Notes (Signed)
Laparoscopy  Incision are well-approximated. No redness or drainage noted

## 2018-10-15 NOTE — ED Provider Notes (Addendum)
[facility] EMERGENCY DEPARTMENT Provider Note   CSN: [id number] Arrival date & time: 10/15/18  1106    History   Chief Complaint Chief Complaint  Patient presents with   Emesis    HPI Jack Weaver is a 63 y.o. male with a history of prostate cancer, hypertension and appendectomy presents to the emergency department for evaluation of diffuse abdominal pain, nausea, vomiting 4 days status post laparoscopic prostatectomy.  Patient states he started having lower abdominal pain after surgery but pain has been increasing.  He is been taking oxycodone 5 mg every 6 hours.  Over the last 2 days he developed nausea and vomiting.  4 episodes of nonbloody bilious vomiting today.  Unable to keep any food down has been able to keep some small amount of liquids down.  He describes diffuse pressure throughout the abdomen.  Denies any fevers or drainage from the incision sites.  He has not had a bowel movement in 5 days.  No history of bowel obstruction or bowel surgery.  Patient's pain is 9 out of 10.  Patient does admit 2 days ago he developed some left shoulder pain describes as aching for 1 hour.  No trauma or injury to the left shoulder.  Shoulder pain has resolved.    HPI  Past Medical History:  Diagnosis Date   Arthritis    Cancer [facility])    Hypertension     Patient Active Problem List   Diagnosis Date Noted   Right lower quadrant abdominal pain    Prostate cancer ([place]) 10/11/2018   Lumbar sprain 09/04/2017   Traumatic rupture of quadriceps tendon 09/02/2017   Bursitis of hip 05/31/2015   ED (erectile dysfunction) of organic origin 11/18/2014   BP (high blood pressure) 11/18/2014   Neuropathy 11/18/2014   Allergic rhinitis 11/14/2014   Arthropathia 11/26/2009   History of tobacco use 07/31/2008   Acid reflux 07/26/2008   Arthritis, degenerative 07/26/2008   Apnea, sleep 06/30/2008    Past Surgical History:  Procedure Laterality Date    APPENDECTOMY     At age 50   [place] Bilateral 10/11/2018   Procedure: PELVIC LYMPH NODE DISSECTION;  Surgeon: Hollice Espy, MD;  Location: [facility] ORS;  Service: Urology;  Laterality: Bilateral;   QUADRICEPS TENDON REPAIR Left 09/09/2017   Procedure: REPAIR QUADRICEP TENDON;  Surgeon: Earnestine Leys, MD;  Location: [facility] ORS;  Service: Orthopedics;  Laterality: Left;   ROBOT ASSISTED LAPAROSCOPIC RADICAL PROSTATECTOMY N/A 10/11/2018   Procedure: ROBOTIC ASSISTED LAPAROSCOPIC RADICAL PROSTATECTOMY;  Surgeon: Hollice Espy, MD;  Location: [facility] ORS;  Service: Urology;  Laterality: N/A;        Home Medications    Prior to Admission medications   Medication Sig Start Date End Date Taking? Authorizing Provider  acetaminophen (TYLENOL) 500 MG tablet Take 1,000 mg by mouth every 8 (eight) hours as needed for mild pain or moderate pain.    [provider]  carisoprodol (SOMA) 350 MG tablet Take 1 tablet (350 mg total) by mouth 3 (three) times daily as needed. Patient not taking: Reported on 10/05/2018 06/27/18   Orbie Pyo, MD  diclofenac sodium (VOLTAREN) 1 % GEL Use up to 4 times a day to the affected area as needed for pain. Patient not taking: Reported on 10/05/2018 06/27/18   Orbie Pyo, MD  diltiazem (CARDIZEM CD) 240 MG 24 hr capsule TAKE 1 CAPSULE BY MOUTH EVERY DAY Patient taking differently: Take 240  mg by mouth daily.  08/11/18   [name], MD  docusate sodium (COLACE) 100 MG capsule Take 1 capsule (100 mg total) by mouth 2 (two) times daily. 10/11/18   Hollice Espy, MD  fluticasone (FLONASE) 50 MCG/ACT nasal spray USE 2 SPRAYS EACH NOSTRIL EVERY DAY Patient taking differently: Place 2 sprays into both nostrils daily as needed for allergies.  03/21/18   [name], MD  lisinopril (PRINIVIL,ZESTRIL) 40 MG tablet Take 1 tablet (40 mg total) by mouth daily. 08/11/18   [name], MD  LORazepam (ATIVAN) 0.5 MG tablet Take 1 tablet (0.5 mg total) by mouth every 8 (eight) hours. Patient not taking: Reported on 10/05/2018 06/29/18   Hollice Espy, MD  Multiple Vitamin (MULTIVITAMIN WITH MINERALS) TABS tablet Take 1 tablet by mouth daily.    [provider]  omeprazole (PRILOSEC) 40 MG capsule Take 1 capsule (40 mg total) by mouth daily. Patient taking differently: Take 40 mg by mouth daily as needed (acid reflux/indigestion).  01/18/18 01/18/19  [name], MD  oxybutynin (DITROPAN) 5 MG tablet Take 1 tablet (5 mg total) by mouth every 8 (eight) hours as needed for bladder spasms. 10/11/18   Hollice Espy, MD  oxyCODONE-acetaminophen (PERCOCET) 5-325 MG tablet Take 1-2 tablets by mouth every 4 (four) hours as needed for moderate pain or severe pain. 10/11/18   Hollice Espy, MD  traMADol (ULTRAM) 50 MG tablet Take 1 tablet (50 mg total) by mouth every 6 (six) hours as needed. Patient not taking: Reported on 10/05/2018 06/29/18   Hollice Espy, MD    Family History Family History  Problem Relation Age of Onset   Heart attack Mother    Stroke Father    Asthma Father    Cancer Brother    Diabetes Brother    Prostate cancer Neg Hx    Kidney cancer Neg Hx    Bladder Cancer Neg Hx     Social History Social History   Tobacco Use   Smoking status: Current Some Day Smoker    Types: Cigars   Smokeless tobacco: Never Used   Tobacco comment: quit in 06/2013. Pt recently started 1 cigarette daily. Now smokes once a week.  Substance Use Topics   Alcohol use: Yes    Alcohol/week: 0.0 standard drinks    Comment: Wine 5x week   Drug use: No     Allergies   Patient has no known allergies.   Review of Systems Review of Systems  Constitutional: Positive for appetite change. Negative for chills, fatigue and fever.  HENT: Negative for trouble swallowing.   Respiratory: Negative for cough, chest tightness and shortness of breath.     Cardiovascular: Negative for chest pain and leg swelling.  Gastrointestinal: Positive for abdominal distention, abdominal pain, nausea and vomiting. Negative for blood in stool, diarrhea and rectal pain.  Genitourinary: Negative for dysuria.  Musculoskeletal: Negative for back pain.  Skin: Negative for wound.  Neurological: Negative for dizziness, weakness, numbness and headaches.     Physical Exam Updated Vital Signs BP (!) 188/74 (BP Location: Right Arm)    Pulse 76    Temp 98.3 F (36.8 C) (Oral)    Resp 20    Ht 6' (1.829 m)    Wt 98.2 kg    SpO2 98%    BMI 29.35 kg/m   Physical Exam Constitutional:      General: He is not in acute distress.    Appearance: Normal  appearance. He is well-developed and normal weight.  HENT:     Head: Normocephalic and atraumatic.  Eyes:     Conjunctiva/sclera: Conjunctivae normal.     Pupils: Pupils are equal, round, and reactive to light.  Neck:     Musculoskeletal: Normal range of motion and neck supple.  Cardiovascular:     Rate and Rhythm: Normal rate and regular rhythm.     Heart sounds: Normal heart sounds.  Pulmonary:     Effort: Pulmonary effort is normal. No respiratory distress.     Breath sounds: Normal breath sounds. No wheezing or rales.  Chest:     Chest wall: No tenderness.  Abdominal:     General: Bowel sounds are decreased. There is distension (mild).     Palpations: Abdomen is soft. There is no shifting dullness or fluid wave.     Tenderness: There is abdominal tenderness in the right lower quadrant. There is guarding.     Comments: Positive high-pitched bowel sounds.  Musculoskeletal: Normal range of motion.        General: No tenderness.  Skin:    General: Skin is warm and dry.  Neurological:     Mental Status: He is alert and oriented to person, place, and time.  Psychiatric:        Behavior: Behavior normal.        Thought Content: Thought content normal.        Judgment: Judgment normal.      ED  Treatments / Results  Labs (all labs ordered are listed, but only abnormal results are displayed) Labs Reviewed  COMPREHENSIVE METABOLIC PANEL - Abnormal; Notable for the following components:      Result Value   Potassium 3.3 (*)    Glucose, Bld 155 (*)    BUN 24 (*)    Creatinine, Ser 1.40 (*)    Total Bilirubin 1.3 (*)    GFR calc non Af Amer 52 (*)    All other components within normal limits  SARS CORONAVIRUS 2 (HOSPITAL ORDER, [place] [facility])  LIPASE, BLOOD  CBC  TROPONIN I  URINALYSIS, COMPLETE (UACMP) WITH MICROSCOPIC    EKG None  Radiology Ct Abdomen Pelvis W Contrast  Result Date: 10/15/2018 CLINICAL DATA:  Abdominal distension and pelvic pain. Recent prostatectomy EXAM: CT ABDOMEN AND PELVIS WITH CONTRAST TECHNIQUE: Multidetector CT imaging of the abdomen and pelvis was performed using the standard protocol following bolus administration of intravenous contrast. CONTRAST:  149mL OMNIPAQUE IOHEXOL 300 MG/ML  SOLN COMPARISON:  July 26, 2018 CT pelvis; CT abdomen and pelvis January 06, 2018 FINDINGS: Lower chest: There is bibasilar atelectasis. Hepatobiliary: There are scattered cysts throughout the liver. The largest cyst is in the region of the dome of the liver measuring 6.0 x 4.8 cm. No non cystic liver lesions are demonstrable. The gallbladder wall is not appreciably thickened. There is no biliary duct dilatation. Pancreas: There is no pancreatic mass or inflammatory focus. Spleen: No splenic lesions are evident. Adrenals/Urinary Tract: Left adrenal appears normal. There is a persistent mass arising from the right adrenal measuring 2.4 x 1.9 cm. This mass appears essentially stable compared to the previous study. Its attenuation value places this mass in an indeterminate category with respect to etiology. Kidneys bilaterally show no evident mass or hydronephrosis on either side. There is no evident renal or ureteral calculus on either side. Urinary bladder  is decompressed with a Foley catheter. Stomach/Bowel: There is dilatation of most of the loops of  small bowel. There is a transition zone in the distal ileal region consistent with a degree of small bowel obstruction. No free air or portal venous air is demonstrable on this study. Bowel loops do not appear significantly thickened. Moderate fluid is seen in the colon without colonic wall thickening. There are occasional colonic diverticula without diverticulitis evident. Vascular/Lymphatic: There is aortic atherosclerosis. There is dilatation of the distal abdominal aorta with a maximum transverse diameter of 3.4 x 3.4 cm. There is no periaortic fluid. There is atherosclerotic calcification in both common iliac arteries. There is no appreciable abscess in the abdomen or pelvis. Reproductive: Patient is status post prostatectomy. There is no pelvic mass evident. Other: There is postoperative change in the anterior pelvic wall with several areas of air consistent with the recent surgery. Foci of air are also noted in the periumbilical region as well as at sites in the abdominal wall anteriorly and laterally consistent with laparoscopic procedure. No associated fluid collections in these areas. Appendix is absent. There is no periappendiceal region. There is no abscess evident in the abdomen pelvis. There is soft tissue thickening and fluid posterior to the rectum which is likely due to the recent surgery. Musculoskeletal: There are foci of degenerative change in the thoracic spine. No blastic or lytic bone lesions are evident. No intramuscular lesions are evident. IMPRESSION: 1. Apparent distal small bowel obstruction with transition zone in the distal ileal region. Most small bowel loops are dilated. There is fluid throughout much of the bowel which may indicate a degree of superimposed ileus secondary to recent surgery. 2. No demonstrable abscess in the abdomen or pelvis. Appendix absent. No periappendiceal region  inflammation. 3. Fluid posterior to the rectum, likely due to recent prostatectomy. Prostate absent. Urinary bladder is decompressed with Foley catheter. Foci of air in the abdominal and pelvic walls is secondary to recent laparoscopic procedure. 4.  No metastatic foci evident. 5. No hydronephrosis on either side. No evident renal or ureteral calculus. 6. There is aortic and iliac artery atherosclerosis. There is dilatation of the distal aorta to 3.4 x 3.4 cm. Recommend followup by ultrasound in 3 years. This recommendation follows ACR consensus guidelines: White Paper of the ACR Incidental Findings Committee II on Vascular Findings. J Am Coll Radiol 2013; 93:810-175 7. Stable right adrenal mass. Stability of this mass suggests benign etiology, although by attenuation criteria, this mass in the right adrenal must be considered indeterminate with respect to etiology. Electronically Signed   By: Lowella Grip III M.D.   On: 10/15/2018 13:53    Procedures Procedures (including critical care time)  Medications Ordered in ED Medications  sodium chloride flush (NS) 0.9 % injection 3 mL (3 mLs Intravenous Not Given 10/15/18 1224)  fentaNYL (SUBLIMAZE) 100 MCG/2ML injection (has no administration in time range)  ondansetron (ZOFRAN) injection 4 mg (4 mg Intravenous Given 10/15/18 1122)  sodium chloride 0.9 % bolus 1,000 mL (0 mLs Intravenous Stopped 10/15/18 1430)  iohexol (OMNIPAQUE) 300 MG/ML solution 100 mL (100 mLs Intravenous Contrast Given 10/15/18 1315)  sodium chloride 0.9 % bolus 1,000 mL (1,000 mLs Intravenous New Bag/Given 10/15/18 1453)     Initial Impression / Assessment and Plan / ED Course  I have reviewed the triage vital signs and the nursing notes.  Pertinent labs & imaging results that were available during my care of the patient were reviewed by me and considered in my medical decision making (see chart for details).      63 year old male with abdominal  pain and bilious vomiting.   He is 4 days status post laparoscopic prostatectomy.  Patient had no bowel movement over the last 5 days with increasing abdominal pain and bilious vomiting.  He has been afebrile.  CT showed no signs of infection, did have concern for ileus.  He did have a small bowel movement here in the ED which did alleviate some of his abdominal pain.  Patient also noted aching pain in the left shoulder 2 days ago.  EKG showed inverted T waves that appear to be new from previous EKGs in the lateral leads.  Troponin negative.  Currently without chest pain.  Patient is admitted for evaluation of ileus as well as abnormal EKG changes. Final Clinical Impressions(s) / ED Diagnoses   Final diagnoses:  Right lower quadrant abdominal pain  Bilious vomiting with nausea  Status post prostatectomy  Abnormal EKG  AKI (acute kidney injury) [facility])  Ileus [facility])    ED Discharge Orders    None       Duanne Guess, PA-C 10/15/18 [place], PA-C 10/15/18 [place], [place], MD 10/16/18 0730

## 2018-10-15 NOTE — H&P (Signed)
[facility] NAME: Jack Weaver    MR#:  [id number]  DATE OF BIRTH:  [birth year]  DATE OF ADMISSION:  10/15/2018  PRIMARY CARE PHYSICIAN: [name], MD   REQUESTING/REFERRING PHYSICIAN: Alfred Levins, [place]  CHIEF COMPLAINT:   Chief Complaint  Patient presents with   Emesis    HISTORY OF PRESENT ILLNESS:  Jack Weaver  is a 63 y.o. male with a known history of prostate cancer status post laparoscopic prostatectomy done 4 days ago by Dr. [name] and hypertension presented to the emergency room with complaints of vomiting and abdominal discomfort times several days.  No fevers.  Patient however said to have had a bowel movement already while in the emergency room.  Passing gas as well.  No further vomiting at this time.  Laboratory studies done with evidence of hypokalemia with potassium of 3.3.  Acute kidney injury with creatinine of 1.4.  Imaging studies done in the emergency room with CT scan of abdomen and pelvis with contrast reviewed findings of dilated small bowels initially read by radiologist as apparent distal small bowel obstruction with superimposed ileus secondary to recent surgery.  Patient already evaluated by surgeon on-call Dr. Dahlia Byes, and diagnosed with postoperative ileus.  No plans for any surgical intervention at this time.  Patient also reported to have complained of left shoulder pain.  No trauma.  No injury to left shoulder.  Shoulder pain currently resolved.  Medical service called to admit patient for further evaluation and management.  PAST MEDICAL HISTORY:   Past Medical History:  Diagnosis Date   Arthritis    Cancer ([place])    Hypertension     PAST SURGICAL HISTORY:   Past Surgical History:  Procedure Laterality Date   APPENDECTOMY     At age 6   [place] Bilateral 10/11/2018   Procedure: PELVIC LYMPH NODE DISSECTION;  Surgeon: Hollice Espy, MD;  Location: [facility] ORS;  Service: Urology;  Laterality: Bilateral;   QUADRICEPS TENDON REPAIR Left 09/09/2017   Procedure: REPAIR QUADRICEP TENDON;  Surgeon: Earnestine Leys, MD;  Location: [facility] OR[facility];  Service: Orthopedics;  Laterality: Left;   ROBOT ASSISTED LAPAROSCOPIC RADICAL PROSTATECTOMY N/A 10/11/2018   Procedure: ROBOTIC ASSISTED LAPAROSCOPIC RADICAL PROSTATECTOMY;  Surgeon: Hollice Espy, MD;  Location: [facility] ORS;  Service: Urology;  Laterality: N/A;    SOCIAL HISTORY:   Social History   Tobacco Use   Smoking status: Current Some Day Smoker    Types: Cigars   Smokeless tobacco: Never Used   Tobacco comment: quit in 06/2013. Pt recently started 1 cigarette daily. Now smokes once a week.  Substance Use Topics   Alcohol use: Yes    Alcohol/week: 0.0 standard drinks    Comment: Wine 5x week    FAMILY HISTORY:   Family History  Problem Relation Age of Onset   Heart attack Mother    Stroke Father    Asthma Father    Cancer Brother    Diabetes Brother    Prostate cancer Neg Hx    Kidney cancer Neg Hx    Bladder Cancer Neg Hx     DRUG ALLERGIES:  No Known Allergies  REVIEW OF SYSTEMS:   ROS  MEDICATIONS AT HOME:   Prior to Admission medications   Medication Sig Start Date End Date Taking? Authorizing Provider  acetaminophen (TYLENOL) 500 MG tablet Take 1,000 mg by mouth every 8 (eight) hours as needed  for mild pain or moderate pain.   Yes [provider]  oxybutynin (DITROPAN) 5 MG tablet Take 1 tablet (5 mg total) by mouth every 8 (eight) hours as needed for bladder spasms. 10/11/18  Yes Hollice Espy, MD  oxyCODONE-acetaminophen (PERCOCET) 5-325 MG tablet Take 1-2 tablets by mouth every 4 (four) hours as needed for moderate pain or severe pain. 10/11/18  Yes Hollice Espy, MD  carisoprodol (SOMA) 350 MG tablet Take 1 tablet (350 mg total) by mouth 3 (three) times daily as needed. Patient not taking: Reported on 10/05/2018 06/27/18    Orbie Pyo, MD  diclofenac sodium (VOLTAREN) 1 % GEL Use up to 4 times a day to the affected area as needed for pain. Patient not taking: Reported on 10/05/2018 06/27/18   Orbie Pyo, MD  diltiazem (CARDIZEM CD) 240 MG 24 hr capsule TAKE 1 CAPSULE BY MOUTH EVERY DAY Patient taking differently: Take 240 mg by mouth daily.  08/11/18   [name], MD  docusate sodium (COLACE) 100 MG capsule Take 1 capsule (100 mg total) by mouth 2 (two) times daily. 10/11/18   Hollice Espy, MD  fluticasone (FLONASE) 50 MCG/ACT nasal spray USE 2 SPRAYS EACH NOSTRIL EVERY DAY Patient taking differently: Place 2 sprays into both nostrils daily as needed for allergies.  03/21/18   [name], MD  lisinopril (PRINIVIL,ZESTRIL) 40 MG tablet Take 1 tablet (40 mg total) by mouth daily. 08/11/18   [name], MD  LORazepam (ATIVAN) 0.5 MG tablet Take 1 tablet (0.5 mg total) by mouth every 8 (eight) hours. Patient not taking: Reported on 10/05/2018 06/29/18   Hollice Espy, MD  Multiple Vitamin (MULTIVITAMIN WITH MINERALS) TABS tablet Take 1 tablet by mouth daily.    [provider]  omeprazole (PRILOSEC) 40 MG capsule Take 1 capsule (40 mg total) by mouth daily. Patient taking differently: Take 40 mg by mouth daily as needed (acid reflux/indigestion).  01/18/18 01/18/19  [name], MD  traMADol (ULTRAM) 50 MG tablet Take 1 tablet (50 mg total) by mouth every 6 (six) hours as needed. Patient not taking: Reported on 10/05/2018 06/29/18   Hollice Espy, MD      VITAL SIGNS:  Blood pressure (!) 188/74, pulse 76, temperature 98.3 F (36.8 C), temperature source Oral, resp. rate 20, height 6' (1.829 m), weight 98.2 kg, SpO2 98 %.  PHYSICAL EXAMINATION:  Physical Exam  GENERAL:  63 y.o.-year-old patient lying in the bed with no acute distress.  EYES: Pupils equal, round, reactive to light and accommodation. No scleral icterus. Extraocular muscles  intact.  HEENT: Head atraumatic, normocephalic. Oropharynx and nasopharynx clear.  NECK:  Supple, no jugular venous distention. No thyroid enlargement, no tenderness.  LUNGS: Normal breath sounds bilaterally, no wheezing, rales,rhonchi or crepitation. No use of accessory muscles of respiration.  CARDIOVASCULAR: S1, S2 normal. No murmurs, rubs, or gallops.  ABDOMEN: Soft, nontender, nondistended. Bowel sounds present. No organomegaly or mass.  GU; patient has Foley catheter in place. EXTREMITIES: No pedal edema, cyanosis, or clubbing.  NEUROLOGIC: Cranial nerves II through XII are intact. Muscle strength 5/5 in all extremities. Sensation intact. Gait not checked.  PSYCHIATRIC: The patient is alert and oriented x 3.  SKIN: No obvious rash, lesion, or ulcer.   LABORATORY PANEL:   CBC Recent Labs  Lab 10/15/18 1121  WBC 7.3  HGB 15.7  HCT 46.3  PLT 275   ------------------------------------------------------------------------------------------------------------------  Chemistries  Recent Labs  Lab 10/15/18  1121  NA 138  K 3.3*  CL 101  CO2 24  GLUCOSE 155*  BUN 24*  CREATININE 1.40*  CALCIUM 9.6  AST 27  ALT 23  ALKPHOS 80  BILITOT 1.3*   ------------------------------------------------------------------------------------------------------------------  Cardiac Enzymes Recent Labs  Lab 10/15/18 1306  TROPONINI <0.03   ------------------------------------------------------------------------------------------------------------------  RADIOLOGY:  Ct Abdomen Pelvis W Contrast  Result Date: 10/15/2018 CLINICAL DATA:  Abdominal distension and pelvic pain. Recent prostatectomy EXAM: CT ABDOMEN AND PELVIS WITH CONTRAST TECHNIQUE: Multidetector CT imaging of the abdomen and pelvis was performed using the standard protocol following bolus administration of intravenous contrast. CONTRAST:  138mL OMNIPAQUE IOHEXOL 300 MG/ML  SOLN COMPARISON:  July 26, 2018 CT pelvis; CT abdomen  and pelvis January 06, 2018 FINDINGS: Lower chest: There is bibasilar atelectasis. Hepatobiliary: There are scattered cysts throughout the liver. The largest cyst is in the region of the dome of the liver measuring 6.0 x 4.8 cm. No non cystic liver lesions are demonstrable. The gallbladder wall is not appreciably thickened. There is no biliary duct dilatation. Pancreas: There is no pancreatic mass or inflammatory focus. Spleen: No splenic lesions are evident. Adrenals/Urinary Tract: Left adrenal appears normal. There is a persistent mass arising from the right adrenal measuring 2.4 x 1.9 cm. This mass appears essentially stable compared to the previous study. Its attenuation value places this mass in an indeterminate category with respect to etiology. Kidneys bilaterally show no evident mass or hydronephrosis on either side. There is no evident renal or ureteral calculus on either side. Urinary bladder is decompressed with a Foley catheter. Stomach/Bowel: There is dilatation of most of the loops of small bowel. There is a transition zone in the distal ileal region consistent with a degree of small bowel obstruction. No free air or portal venous air is demonstrable on this study. Bowel loops do not appear significantly thickened. Moderate fluid is seen in the colon without colonic wall thickening. There are occasional colonic diverticula without diverticulitis evident. Vascular/Lymphatic: There is aortic atherosclerosis. There is dilatation of the distal abdominal aorta with a maximum transverse diameter of 3.4 x 3.4 cm. There is no periaortic fluid. There is atherosclerotic calcification in both common iliac arteries. There is no appreciable abscess in the abdomen or pelvis. Reproductive: Patient is status post prostatectomy. There is no pelvic mass evident. Other: There is postoperative change in the anterior pelvic wall with several areas of air consistent with the recent surgery. Foci of air are also noted in the  periumbilical region as well as at sites in the abdominal wall anteriorly and laterally consistent with laparoscopic procedure. No associated fluid collections in these areas. Appendix is absent. There is no periappendiceal region. There is no abscess evident in the abdomen pelvis. There is soft tissue thickening and fluid posterior to the rectum which is likely due to the recent surgery. Musculoskeletal: There are foci of degenerative change in the thoracic spine. No blastic or lytic bone lesions are evident. No intramuscular lesions are evident. IMPRESSION: 1. Apparent distal small bowel obstruction with transition zone in the distal ileal region. Most small bowel loops are dilated. There is fluid throughout much of the bowel which may indicate a degree of superimposed ileus secondary to recent surgery. 2. No demonstrable abscess in the abdomen or pelvis. Appendix absent. No periappendiceal region inflammation. 3. Fluid posterior to the rectum, likely due to recent prostatectomy. Prostate absent. Urinary bladder is decompressed with Foley catheter. Foci of air in the abdominal and pelvic walls is secondary  to recent laparoscopic procedure. 4.  No metastatic foci evident. 5. No hydronephrosis on either side. No evident renal or ureteral calculus. 6. There is aortic and iliac artery atherosclerosis. There is dilatation of the distal aorta to 3.4 x 3.4 cm. Recommend followup by ultrasound in 3 years. This recommendation follows ACR consensus guidelines: White Paper of the ACR Incidental Findings Committee II on Vascular Findings. J Am Coll Radiol 2013; 52:778-242 7. Stable right adrenal mass. Stability of this mass suggests benign etiology, although by attenuation criteria, this mass in the right adrenal must be considered indeterminate with respect to etiology. Electronically Signed   By: Lowella Grip III M.D.   On: 10/15/2018 13:53      IMPRESSION AND PLAN:  Patient is a 63 year old African-American male  with history of prostate cancer status post prostatectomy 4 days ago by Dr. [name] and hypertension who presented to the emergency room with nausea and vomiting with abdominal discomfort.  Diagnosed with postoperative ileus.  Already evaluated by surgery in the emergency room.  1.  Postoperative ileus. Patient is day 4.  Laparoscopy prostatectomy. Patient already had a bowel movement in the emergency room.  Nausea and vomiting currently appear resolved.  Surgeon reviewed imaging and needed diagnosis of postoperative ileus. Patient kept n.p.o. for now.  Surgeon discussed with patient and patient did not want NG tube placement if at all possible at this time.  Continue serial abdominal exams and surgeon following patient.  2.  History of prostate cancer status post recent laparoscopic prostatectomy Patient due for postop. Stable.  If any urological issues will consult urologist Dr. [name]  3.  Hypokalemia Replaced with potassium.  Follow-up on repeat levels in a.m.  4.  Acute kidney injury. Placed on IV fluid hydration.  Follow-up on renal function in a.m. We will consider nephrology consultation and renal ultrasound if no improvement in renal function in a.m.  DVT prophylaxis; Lovenox    All the records are reviewed and case discussed with ED provider. Management plans discussed with the patient, family and they are in agreement.  CODE STATUS: Full code  TOTAL TIME TAKING CARE OF THIS PATIENT: 61 minutes.    Tumeka Chimenti M.D on 10/15/2018 at 4:04 PM  Between 7am to 6pm - Pager - [phone]  After 6pm go to [email or web address] - password [email or web address] [facility]  [facility]  Office  [phone]  CC: Primary care physician; [name], MD   Note: This dictation was prepared with Dragon dictation along with smaller phrase technology. Any transcriptional errors that result from this process are unintentional.

## 2018-10-16 ENCOUNTER — Inpatient Hospital Stay: Payer: 59

## 2018-10-16 DIAGNOSIS — N179 Acute kidney failure, unspecified: Secondary | ICD-10-CM | POA: Diagnosis not present

## 2018-10-16 DIAGNOSIS — K567 Ileus, unspecified: Secondary | ICD-10-CM

## 2018-10-16 DIAGNOSIS — E876 Hypokalemia: Secondary | ICD-10-CM | POA: Diagnosis not present

## 2018-10-16 DIAGNOSIS — K9189 Other postprocedural complications and disorders of digestive system: Secondary | ICD-10-CM | POA: Diagnosis not present

## 2018-10-16 LAB — COMPREHENSIVE METABOLIC PANEL
ALT: 21 U/L (ref 0–44)
AST: 24 U/L (ref 15–41)
Albumin: 3 g/dL — ABNORMAL LOW (ref 3.5–5.0)
Alkaline Phosphatase: 60 U/L (ref 38–126)
Anion gap: 6 (ref 5–15)
BUN: 19 mg/dL (ref 8–23)
CO2: 24 mmol/L (ref 22–32)
Calcium: 8.1 mg/dL — ABNORMAL LOW (ref 8.9–10.3)
Chloride: 112 mmol/L — ABNORMAL HIGH (ref 98–111)
Creatinine, Ser: 1.07 mg/dL (ref 0.61–1.24)
GFR calc Af Amer: >60 mL/min (ref >60)
GFR calc non Af Amer: >60 mL/min (ref >60)
Glucose, Bld: 113 mg/dL — ABNORMAL HIGH (ref 70–99)
Potassium: 3.2 mmol/L — ABNORMAL LOW (ref 3.5–5.1)
Sodium: 142 mmol/L (ref 135–145)
Total Bilirubin: 0.9 mg/dL (ref 0.3–1.2)
Total Protein: 5.5 g/dL — ABNORMAL LOW (ref 6.5–8.1)

## 2018-10-16 LAB — CBC
HCT: 37.1 % — ABNORMAL LOW (ref 39.0–52.0)
Hemoglobin: 12.1 g/dL — ABNORMAL LOW (ref 13.0–17.0)
MCH: 28 pg (ref 26.0–34.0)
MCHC: 32.6 g/dL (ref 30.0–36.0)
MCV: 85.9 fL (ref 80.0–100.0)
Platelets: 201 10*3/uL (ref 150–400)
RBC: 4.32 MIL/uL (ref 4.22–5.81)
RDW: 12.6 % (ref 11.5–15.5)
WBC: 4 10*3/uL (ref 4.0–10.5)
nRBC: 0 % (ref 0.0–0.2)

## 2018-10-16 LAB — MAGNESIUM: Magnesium: 2 mg/dL (ref 1.7–2.4)

## 2018-10-16 MED ORDER — LISINOPRIL 20 MG PO TABS
40.0000 mg | ORAL_TABLET | Freq: Every day | ORAL | Status: DC
  Administered 2018-10-16 – 2018-10-18 (×3): 40 mg via ORAL
  Filled 2018-10-16 (×3): qty 2

## 2018-10-16 MED ORDER — ALBUMIN HUMAN 25 % IV SOLN
12.5000 g | Freq: Once | INTRAVENOUS | Status: AC
  Administered 2018-10-16: 12.5 g via INTRAVENOUS
  Filled 2018-10-16: qty 50

## 2018-10-16 MED ORDER — POTASSIUM CHLORIDE CRYS ER 20 MEQ PO TBCR
40.0000 meq | EXTENDED_RELEASE_TABLET | Freq: Once | ORAL | Status: AC
  Administered 2018-10-16: 40 meq via ORAL
  Filled 2018-10-16: qty 2

## 2018-10-16 MED ORDER — POTASSIUM CHLORIDE 10 MEQ/100ML IV SOLN
10.0000 meq | INTRAVENOUS | Status: DC
  Administered 2018-10-16: 10 meq via INTRAVENOUS
  Filled 2018-10-16 (×4): qty 100

## 2018-10-16 MED ORDER — PANTOPRAZOLE SODIUM 40 MG PO TBEC
40.0000 mg | DELAYED_RELEASE_TABLET | Freq: Every day | ORAL | Status: DC
  Administered 2018-10-16 – 2018-10-18 (×3): 40 mg via ORAL
  Filled 2018-10-16 (×3): qty 1

## 2018-10-16 MED ORDER — DILTIAZEM HCL ER COATED BEADS 120 MG PO CP24
240.0000 mg | ORAL_CAPSULE | Freq: Every day | ORAL | Status: DC
  Administered 2018-10-16 – 2018-10-18 (×3): 240 mg via ORAL
  Filled 2018-10-16 (×3): qty 2

## 2018-10-16 MED ORDER — DOCUSATE SODIUM 100 MG PO CAPS: 100.0000 mg | ORAL_CAPSULE | Freq: Two times a day (BID) | ORAL | Status: DC | PRN

## 2018-10-16 MED ORDER — ADULT MULTIVITAMIN W/MINERALS CH
1.0000 | ORAL_TABLET | Freq: Every day | ORAL | Status: DC
  Administered 2018-10-16 – 2018-10-18 (×3): 1 via ORAL
  Filled 2018-10-16 (×3): qty 1

## 2018-10-16 MED ORDER — LACTATED RINGERS IV BOLUS
1000.0000 mL | Freq: Once | INTRAVENOUS | Status: AC
  Administered 2018-10-16: 1000 mL via INTRAVENOUS

## 2018-10-16 MED ORDER — OXYCODONE-ACETAMINOPHEN 5-325 MG PO TABS
1.0000 | ORAL_TABLET | ORAL | Status: DC | PRN
  Administered 2018-10-16 – 2018-10-17 (×2): 1 via ORAL
  Filled 2018-10-16 (×2): qty 1

## 2018-10-16 MED ORDER — OXYBUTYNIN CHLORIDE 5 MG PO TABS
5.0000 mg | ORAL_TABLET | Freq: Three times a day (TID) | ORAL | Status: DC | PRN
  Administered 2018-10-17 – 2018-10-18 (×3): 5 mg via ORAL
  Filled 2018-10-16 (×3): qty 1

## 2018-10-16 NOTE — Progress Notes (Signed)
CC: Ileus Subjective: Feeling better, creat improved. + gas no more diarrhea. KUB pers. Reviewed, gas in the colon, diffuse SB dilation w some improvement. No overt free air  Objective: Vital signs in last 24 hours: Temp:  [98.1 F (36.7 C)-98.4 F (36.9 C)] 98.4 F (36.9 C) (05/23 0433) Pulse Rate:  [53-76] 59 (05/23 0433) Resp:  [16-23] 16 (05/23 0433) BP: (122-188)/(74-94) 136/84 (05/23 0433) SpO2:  [98 %-100 %] 99 % (05/23 0433) Last BM Date: 10/15/18  Intake/Output from previous day: 05/22 0701 - 05/23 0700 In: 2100.8 [I.V.:1000.8; IV Piggyback:1100] Out: 300 [Urine:300] Intake/Output this shift: No intake/output data recorded.  Physical exam:  NAD, alert Abd: soft, mild distension decrease BS. Incisions c/d/i. No peritonitis, minimal tenderness around incisions. Foley: clear urine Ext: no edema and well perfused   Lab Results: CBC  Recent Labs    10/15/18 1121 10/16/18 0540  WBC 7.3 4.0  HGB 15.7 12.1*  HCT 46.3 37.1*  PLT 275 201   BMET Recent Labs    10/15/18 1121 10/16/18 0540  NA 138 142  K 3.3* 3.2*  CL 101 112*  CO2 24 24  GLUCOSE 155* 113*  BUN 24* 19  CREATININE 1.40* 1.07  CALCIUM 9.6 8.1*   PT/INR No results for input(s): LABPROT, INR in the last 72 hours. ABG No results for input(s): PHART, HCO3 in the last 72 hours.  Invalid input(s): PCO2, PO2  Studies/Results: Ct Abdomen Pelvis W Contrast  Addendum Date: 10/16/2018   ADDENDUM REPORT: 10/16/2018 08:06 ADDENDUM: There is a small amount of pneumoperitoneum which is felt to most likely be of postoperative etiology. Electronically Signed   By: Lowella Grip III M.D.   On: 10/16/2018 08:06   Result Date: 10/16/2018 CLINICAL DATA:  Abdominal distension and pelvic pain. Recent prostatectomy EXAM: CT ABDOMEN AND PELVIS WITH CONTRAST TECHNIQUE: Multidetector CT imaging of the abdomen and pelvis was performed using the standard protocol following bolus administration of intravenous  contrast. CONTRAST:  115mL OMNIPAQUE IOHEXOL 300 MG/ML  SOLN COMPARISON:  July 26, 2018 CT pelvis; CT abdomen and pelvis January 06, 2018 FINDINGS: Lower chest: There is bibasilar atelectasis. Hepatobiliary: There are scattered cysts throughout the liver. The largest cyst is in the region of the dome of the liver measuring 6.0 x 4.8 cm. No non cystic liver lesions are demonstrable. The gallbladder wall is not appreciably thickened. There is no biliary duct dilatation. Pancreas: There is no pancreatic mass or inflammatory focus. Spleen: No splenic lesions are evident. Adrenals/Urinary Tract: Left adrenal appears normal. There is a persistent mass arising from the right adrenal measuring 2.4 x 1.9 cm. This mass appears essentially stable compared to the previous study. Its attenuation value places this mass in an indeterminate category with respect to etiology. Kidneys bilaterally show no evident mass or hydronephrosis on either side. There is no evident renal or ureteral calculus on either side. Urinary bladder is decompressed with a Foley catheter. Stomach/Bowel: There is dilatation of most of the loops of small bowel. There is a transition zone in the distal ileal region consistent with a degree of small bowel obstruction. No free air or portal venous air is demonstrable on this study. Bowel loops do not appear significantly thickened. Moderate fluid is seen in the colon without colonic wall thickening. There are occasional colonic diverticula without diverticulitis evident. Vascular/Lymphatic: There is aortic atherosclerosis. There is dilatation of the distal abdominal aorta with a maximum transverse diameter of 3.4 x 3.4 cm. There is no periaortic fluid. There  is atherosclerotic calcification in both common iliac arteries. There is no appreciable abscess in the abdomen or pelvis. Reproductive: Patient is status post prostatectomy. There is no pelvic mass evident. Other: There is postoperative change in the  anterior pelvic wall with several areas of air consistent with the recent surgery. Foci of air are also noted in the periumbilical region as well as at sites in the abdominal wall anteriorly and laterally consistent with laparoscopic procedure. No associated fluid collections in these areas. Appendix is absent. There is no periappendiceal region. There is no abscess evident in the abdomen pelvis. There is soft tissue thickening and fluid posterior to the rectum which is likely due to the recent surgery. Musculoskeletal: There are foci of degenerative change in the thoracic spine. No blastic or lytic bone lesions are evident. No intramuscular lesions are evident. IMPRESSION: 1. Apparent distal small bowel obstruction with transition zone in the distal ileal region. Most small bowel loops are dilated. There is fluid throughout much of the bowel which may indicate a degree of superimposed ileus secondary to recent surgery. 2. No demonstrable abscess in the abdomen or pelvis. Appendix absent. No periappendiceal region inflammation. 3. Fluid posterior to the rectum, likely due to recent prostatectomy. Prostate absent. Urinary bladder is decompressed with Foley catheter. Foci of air in the abdominal and pelvic walls is secondary to recent laparoscopic procedure. 4.  No metastatic foci evident. 5. No hydronephrosis on either side. No evident renal or ureteral calculus. 6. There is aortic and iliac artery atherosclerosis. There is dilatation of the distal aorta to 3.4 x 3.4 cm. Recommend followup by ultrasound in 3 years. This recommendation follows ACR consensus guidelines: White Paper of the ACR Incidental Findings Committee II on Vascular Findings. J Am Coll Radiol 2013; 62:376-283 7. Stable right adrenal mass. Stability of this mass suggests benign etiology, although by attenuation criteria, this mass in the right adrenal must be considered indeterminate with respect to etiology. Electronically Signed: By: Lowella Grip III M.D. On: 10/15/2018 13:53   Dg Abd Portable 2v  Result Date: 10/16/2018 CLINICAL DATA:  Ileus versus bowel obstruction EXAM: PORTABLE ABDOMEN - 2 VIEW COMPARISON:  CT abdomen 10/15/2018 FINDINGS: Mildly dilated small bowel loops with air-fluid levels on the upright view, possibly improved from the CT. Small amount of free air under the right hemidiaphragm as noted on CT. This is likely due to recent prostatectomy. No acute skeletal abnormality.  No urinary tract calculi. IMPRESSION: Dilated small bowel loops with mild interval improvement. Small amount of pneumoperitoneum likely due to recent prostatectomy. Electronically Signed   By: Franchot Gallo M.D.   On: 10/16/2018 08:05    Anti-infectives: Anti-infectives (From admission, onward)   None      Assessment/Plan:  Ileus slowly improving May advance to clears for now Once GI fx return may do regular diet No need for surgical intervention We will be available  Caroleen Hamman, MD, [facility]  10/16/2018

## 2018-10-16 NOTE — Progress Notes (Signed)
[place] [facility] [place] NAME: Jack Weaver    MR#:  [id number]  DATE OF BIRTH:  June 02, 1955  SUBJECTIVE:  CHIEF COMPLAINT:   Chief Complaint  Patient presents with  . Emesis   The patient has better abdominal pain, no nausea or vomiting.  He passed gas but no bowel movement today.  Per Dr. Dahlia Byes, the patient had a diarrhea last night. REVIEW OF SYSTEMS:  Review of Systems  Constitutional: Negative for chills, fever and malaise/fatigue.  HENT: Negative for sore throat.   Eyes: Negative for blurred vision and double vision.  Respiratory: Negative for cough, hemoptysis, shortness of breath, wheezing and stridor.   Cardiovascular: Negative for chest pain, palpitations, orthopnea and leg swelling.  Gastrointestinal: Positive for abdominal pain. Negative for blood in stool, diarrhea, melena, nausea and vomiting.  Genitourinary: Negative for dysuria, flank pain and hematuria.  Musculoskeletal: Negative for back pain and joint pain.  Skin: Negative for rash.  Neurological: Negative for dizziness, sensory change, focal weakness, seizures, loss of consciousness, weakness and headaches.  Endo/Heme/Allergies: Negative for polydipsia.  Psychiatric/Behavioral: Negative for depression. The patient is not nervous/anxious.     DRUG ALLERGIES:  No Known Allergies VITALS:  Blood pressure (!) 166/91, pulse 62, temperature 98.6 F (37 C), temperature source Oral, resp. rate 15, height 6' (1.829 m), weight 98.2 kg, SpO2 100 %. PHYSICAL EXAMINATION:  Physical Exam Constitutional:      General: He is not in acute distress.    Appearance: Normal appearance.  HENT:     Head: Normocephalic.     Mouth/Throat:     Mouth: Mucous membranes are moist.  Eyes:     General: No scleral icterus.    Conjunctiva/sclera: Conjunctivae normal.     Pupils: Pupils are equal, round, and reactive to light.  Neck:     Musculoskeletal: Normal range of motion and neck supple.      Vascular: No JVD.     Trachea: No tracheal deviation.  Cardiovascular:     Rate and Rhythm: Normal rate and regular rhythm.     Heart sounds: Normal heart sounds. No murmur. No gallop.   Pulmonary:     Effort: Pulmonary effort is normal. No respiratory distress.     Breath sounds: Normal breath sounds. No wheezing or rales.  Abdominal:     General: Bowel sounds are normal. There is no distension.     Palpations: Abdomen is soft.     Tenderness: There is abdominal tenderness. There is no rebound.  Musculoskeletal: Normal range of motion.        General: No tenderness.     Right lower leg: No edema.     Left lower leg: No edema.  Skin:    Findings: No erythema or rash.  Neurological:     General: No focal deficit present.     Mental Status: He is alert and oriented to person, place, and time.     Cranial Nerves: No cranial nerve deficit.  Psychiatric:        Mood and Affect: Mood normal.    LABORATORY PANEL:  Male CBC Recent Labs  Lab 10/16/18 0540  WBC 4.0  HGB 12.1*  HCT 37.1*  PLT 201   ------------------------------------------------------------------------------------------------------------------ Chemistries  Recent Labs  Lab 10/16/18 0540  NA 142  K 3.2*  CL 112*  CO2 24  GLUCOSE 113*  BUN 19  CREATININE 1.07  CALCIUM 8.1*  MG 2.0  AST 24  ALT 21  ALKPHOS 60  BILITOT 0.9   RADIOLOGY:  Ct Abdomen Pelvis W Contrast  Addendum Date: 10/16/2018   ADDENDUM REPORT: 10/16/2018 08:06 ADDENDUM: There is a small amount of pneumoperitoneum which is felt to most likely be of postoperative etiology. Electronically Signed   By: Lowella Grip III M.D.   On: 10/16/2018 08:06   Result Date: 10/16/2018 CLINICAL DATA:  Abdominal distension and pelvic pain. Recent prostatectomy EXAM: CT ABDOMEN AND PELVIS WITH CONTRAST TECHNIQUE: Multidetector CT imaging of the abdomen and pelvis was performed using the standard protocol following bolus administration of  intravenous contrast. CONTRAST:  140mL OMNIPAQUE IOHEXOL 300 MG/ML  SOLN COMPARISON:  July 26, 2018 CT pelvis; CT abdomen and pelvis January 06, 2018 FINDINGS: Lower chest: There is bibasilar atelectasis. Hepatobiliary: There are scattered cysts throughout the liver. The largest cyst is in the region of the dome of the liver measuring 6.0 x 4.8 cm. No non cystic liver lesions are demonstrable. The gallbladder wall is not appreciably thickened. There is no biliary duct dilatation. Pancreas: There is no pancreatic mass or inflammatory focus. Spleen: No splenic lesions are evident. Adrenals/Urinary Tract: Left adrenal appears normal. There is a persistent mass arising from the right adrenal measuring 2.4 x 1.9 cm. This mass appears essentially stable compared to the previous study. Its attenuation value places this mass in an indeterminate category with respect to etiology. Kidneys bilaterally show no evident mass or hydronephrosis on either side. There is no evident renal or ureteral calculus on either side. Urinary bladder is decompressed with a Foley catheter. Stomach/Bowel: There is dilatation of most of the loops of small bowel. There is a transition zone in the distal ileal region consistent with a degree of small bowel obstruction. No free air or portal venous air is demonstrable on this study. Bowel loops do not appear significantly thickened. Moderate fluid is seen in the colon without colonic wall thickening. There are occasional colonic diverticula without diverticulitis evident. Vascular/Lymphatic: There is aortic atherosclerosis. There is dilatation of the distal abdominal aorta with a maximum transverse diameter of 3.4 x 3.4 cm. There is no periaortic fluid. There is atherosclerotic calcification in both common iliac arteries. There is no appreciable abscess in the abdomen or pelvis. Reproductive: Patient is status post prostatectomy. There is no pelvic mass evident. Other: There is postoperative change in  the anterior pelvic wall with several areas of air consistent with the recent surgery. Foci of air are also noted in the periumbilical region as well as at sites in the abdominal wall anteriorly and laterally consistent with laparoscopic procedure. No associated fluid collections in these areas. Appendix is absent. There is no periappendiceal region. There is no abscess evident in the abdomen pelvis. There is soft tissue thickening and fluid posterior to the rectum which is likely due to the recent surgery. Musculoskeletal: There are foci of degenerative change in the thoracic spine. No blastic or lytic bone lesions are evident. No intramuscular lesions are evident. IMPRESSION: 1. Apparent distal small bowel obstruction with transition zone in the distal ileal region. Most small bowel loops are dilated. There is fluid throughout much of the bowel which may indicate a degree of superimposed ileus secondary to recent surgery. 2. No demonstrable abscess in the abdomen or pelvis. Appendix absent. No periappendiceal region inflammation. 3. Fluid posterior to the rectum, likely due to recent prostatectomy. Prostate absent. Urinary bladder is decompressed with Foley catheter. Foci of air in the abdominal and pelvic walls is secondary to recent laparoscopic procedure. 4.  No metastatic foci evident. 5. No hydronephrosis on either side. No evident renal or ureteral calculus. 6. There is aortic and iliac artery atherosclerosis. There is dilatation of the distal aorta to 3.4 x 3.4 cm. Recommend followup by ultrasound in 3 years. This recommendation follows ACR consensus guidelines: White Paper of the ACR Incidental Findings Committee II on Vascular Findings. J Am Coll Radiol 2013; 97:588-325 7. Stable right adrenal mass. Stability of this mass suggests benign etiology, although by attenuation criteria, this mass in the right adrenal must be considered indeterminate with respect to etiology. Electronically Signed: By: Lowella Grip III M.D. On: 10/15/2018 13:53   Dg Abd Portable 2v  Result Date: 10/16/2018 CLINICAL DATA:  Ileus versus bowel obstruction EXAM: PORTABLE ABDOMEN - 2 VIEW COMPARISON:  CT abdomen 10/15/2018 FINDINGS: Mildly dilated small bowel loops with air-fluid levels on the upright view, possibly improved from the CT. Small amount of free air under the right hemidiaphragm as noted on CT. This is likely due to recent prostatectomy. No acute skeletal abnormality.  No urinary tract calculi. IMPRESSION: Dilated small bowel loops with mild interval improvement. Small amount of pneumoperitoneum likely due to recent prostatectomy. Electronically Signed   By: Franchot Gallo M.D.   On: 10/16/2018 08:05   ASSESSMENT AND PLAN:   Patient is a 63 year old African-American male with history of prostate cancer status post prostatectomy 4 days ago by Dr. [name] and hypertension who presented to the emergency room with nausea and vomiting with abdominal discomfort.  Diagnosed with postoperative ileus.  Already evaluated by surgery in the emergency room.  1.  Postoperative ileus. Patient is s/p Laparoscopy prostatectomy. Start clear liquid diet for Dr. Dahlia Byes.  Advance as tolerated.  Pain control.  2.  History of prostate cancer status post recent laparoscopic prostatectomy Patient due for postop. Stable.   3.  Hypokalemia Potassium is still low, replace with potassium.   4.  Acute kidney injury. Improved with IV fluid.  Hypertension.  Resume home lisinopril and Cardizem.  All the records are reviewed and case discussed with Care Management/Social Worker. Management plans discussed with the patient, his wife and they are in agreement.  CODE STATUS: Full Code  TOTAL TIME TAKING CARE OF THIS PATIENT: 32 minutes.   More than 50% of the time was spent in counseling/coordination of care: YES  POSSIBLE D/C IN 1-2 DAYS, DEPENDING ON CLINICAL CONDITION.   Demetrios Loll M.D on 10/16/2018 at 12:24 PM  Between  7am to 6pm - Pager - [phone]  After 6pm go to [email or web address] - [facility]

## 2018-10-17 DIAGNOSIS — E876 Hypokalemia: Secondary | ICD-10-CM | POA: Diagnosis not present

## 2018-10-17 DIAGNOSIS — K9189 Other postprocedural complications and disorders of digestive system: Secondary | ICD-10-CM | POA: Diagnosis not present

## 2018-10-17 DIAGNOSIS — N179 Acute kidney failure, unspecified: Secondary | ICD-10-CM | POA: Diagnosis not present

## 2018-10-17 LAB — BASIC METABOLIC PANEL
Anion gap: 7 (ref 5–15)
BUN: 12 mg/dL (ref 8–23)
CO2: 24 mmol/L (ref 22–32)
Calcium: 8.5 mg/dL — ABNORMAL LOW (ref 8.9–10.3)
Chloride: 110 mmol/L (ref 98–111)
Creatinine, Ser: 0.97 mg/dL (ref 0.61–1.24)
GFR calc Af Amer: >60 mL/min (ref >60)
GFR calc non Af Amer: >60 mL/min (ref >60)
Glucose, Bld: 100 mg/dL — ABNORMAL HIGH (ref 70–99)
Potassium: 3.2 mmol/L — ABNORMAL LOW (ref 3.5–5.1)
Sodium: 141 mmol/L (ref 135–145)

## 2018-10-17 LAB — MAGNESIUM: Magnesium: 2 mg/dL (ref 1.7–2.4)

## 2018-10-17 LAB — HIV ANTIBODY (ROUTINE TESTING W REFLEX): HIV Screen 4th Generation wRfx: NONREACTIVE

## 2018-10-17 MED ORDER — ENSURE ENLIVE PO LIQD
237.0000 mL | Freq: Two times a day (BID) | ORAL | Status: DC
  Administered 2018-10-17 – 2018-10-18 (×2): 237 mL via ORAL

## 2018-10-17 MED ORDER — POTASSIUM CHLORIDE CRYS ER 20 MEQ PO TBCR
40.0000 meq | EXTENDED_RELEASE_TABLET | Freq: Once | ORAL | Status: AC
  Administered 2018-10-17: 40 meq via ORAL
  Filled 2018-10-17: qty 2

## 2018-10-17 NOTE — Progress Notes (Signed)
[place] [facility] [place] NAME: Jack Weaver    MR#:  [id number]  DATE OF BIRTH:  [birth year]  SUBJECTIVE:  CHIEF COMPLAINT:   Chief Complaint  Patient presents with  . Emesis   The patient has no abdominal pain, he had a bowel movement this morning.  Tolerated clear liquid diet. REVIEW OF SYSTEMS:  Review of Systems  Constitutional: Negative for chills, fever and malaise/fatigue.  HENT: Negative for sore throat.   Eyes: Negative for blurred vision and double vision.  Respiratory: Negative for cough, hemoptysis, shortness of breath, wheezing and stridor.   Cardiovascular: Negative for chest pain, palpitations, orthopnea and leg swelling.  Gastrointestinal: Negative for abdominal pain, blood in stool, diarrhea, melena, nausea and vomiting.  Genitourinary: Negative for dysuria, flank pain and hematuria.  Musculoskeletal: Negative for back pain and joint pain.  Skin: Negative for rash.  Neurological: Negative for dizziness, sensory change, focal weakness, seizures, loss of consciousness, weakness and headaches.  Endo/Heme/Allergies: Negative for polydipsia.  Psychiatric/Behavioral: Negative for depression. The patient is not nervous/anxious.     DRUG ALLERGIES:  No Known Allergies VITALS:  Blood pressure 140/87, pulse (!) 49, temperature 98.7 F (37.1 C), temperature source Oral, resp. rate 16, height 6' (1.829 m), weight 98.2 kg, SpO2 99 %. PHYSICAL EXAMINATION:  Physical Exam Constitutional:      General: He is not in acute distress.    Appearance: Normal appearance.  HENT:     Head: Normocephalic.     Mouth/Throat:     Mouth: Mucous membranes are moist.  Eyes:     General: No scleral icterus.    Conjunctiva/sclera: Conjunctivae normal.     Pupils: Pupils are equal, round, and reactive to light.  Neck:     Musculoskeletal: Normal range of motion and neck supple.     Vascular: No JVD.     Trachea: No tracheal deviation.   Cardiovascular:     Rate and Rhythm: Normal rate and regular rhythm.     Heart sounds: Normal heart sounds. No murmur. No gallop.   Pulmonary:     Effort: Pulmonary effort is normal. No respiratory distress.     Breath sounds: Normal breath sounds. No wheezing or rales.  Abdominal:     General: Bowel sounds are normal. There is no distension.     Palpations: Abdomen is soft.     Tenderness: There is abdominal tenderness. There is no rebound.  Musculoskeletal: Normal range of motion.        General: No tenderness.     Right lower leg: No edema.     Left lower leg: No edema.  Skin:    Findings: No erythema or rash.  Neurological:     General: No focal deficit present.     Mental Status: He is alert and oriented to person, place, and time.     Cranial Nerves: No cranial nerve deficit.  Psychiatric:        Mood and Affect: Mood normal.    LABORATORY PANEL:  Male CBC Recent Labs  Lab 10/16/18 0540  WBC 4.0  HGB 12.1*  HCT 37.1*  PLT 201   ------------------------------------------------------------------------------------------------------------------ Chemistries  Recent Labs  Lab 10/16/18 0540 10/17/18 0419  NA 142 141  K 3.2* 3.2*  CL 112* 110  CO2 24 24  GLUCOSE 113* 100*  BUN 19 12  CREATININE 1.07 0.97  CALCIUM 8.1* 8.5*  MG 2.0 2.0  AST 24  --   ALT 21  --  ALKPHOS 60  --   BILITOT 0.9  --    RADIOLOGY:  No results found. ASSESSMENT AND PLAN:   Patient is a 63 year old African-American male with history of prostate cancer status post prostatectomy 4 days ago by Dr. [name] and hypertension who presented to the emergency room with nausea and vomiting with abdominal discomfort.  Diagnosed with postoperative ileus.  Already evaluated by surgery in the emergency room.  1.  Postoperative ileus. Patient is s/p Laparoscopy prostatectomy. Advance to full liquid diet for Dr. Dahlia Byes.  Advance as tolerated.  Pain control.  2.  History of prostate cancer status  post recent laparoscopic prostatectomy Patient due for postop. Stable.   3.  Hypokalemia Potassium is still low, replace with potassium.   4.  Acute kidney injury. Improved with IV fluid.  Hypertension.  Resume home lisinopril and Cardizem. I discussed with Dr. Dahlia Byes. All the records are reviewed and case discussed with Care Management/Social Worker. Management plans discussed with the patient, his wife and they are in agreement.  CODE STATUS: Full Code  TOTAL TIME TAKING CARE OF THIS PATIENT: 25 minutes.   More than 50% of the time was spent in counseling/coordination of care: YES  POSSIBLE D/C IN 1-2 DAYS, DEPENDING ON CLINICAL CONDITION.   Demetrios Loll M.D on 10/17/2018 at 1:04 PM  Between 7am to 6pm - Pager - [phone]  After 6pm go to [email or web address] - [facility]

## 2018-10-17 NOTE — Progress Notes (Signed)
Pt complaining of pressure and leaking from the foley around the penis couple times this shift. Pt noted he felt like standing when he feels the pressure and pain on the RL abd. Pain med and med for bladder spasm administered.

## 2018-10-17 NOTE — Progress Notes (Signed)
Initial Nutrition Assessment  RD working remotely.  DOCUMENTATION CODES:   Not applicable  INTERVENTION:  Provide Ensure Enlive po BID, each supplement provides 350 kcal and 20 grams of protein.  NUTRITION DIAGNOSIS:   Increased nutrient needs related to post-op healing as evidenced by estimated needs.  GOAL:   Patient will meet greater than or equal to 90% of their needs  MONITOR:   PO intake, Supplement acceptance, Diet advancement, Labs, Weight trends, Skin, I & O's  REASON FOR ASSESSMENT:   Malnutrition Screening Tool    ASSESSMENT:   63 year old male with PMHx of HTN, arthritis, prostate cancer s/p prostatectomy on 5/18 now admitted with postoperative ileus.   Attempted to call patient's phone for nutrition/weight history but he was unable to answer. Per chart diet was advanced to clear liquids yesterday and full liquids today. Meal completion is not being recorded so unsure how much of trays he is taking in. Per RN documentation patient's abdomen is distended. His last BM was a medium type 5 last night. Weight in chart appears stable.  Medications reviewed and include: lisinopril 40 mg daily, MVI daily, pantoprazole.  Labs reviewed: Potassium 3.2.  NUTRITION - FOCUSED PHYSICAL EXAM:  Unable to complete at this time.  Diet Order:   Diet Order            Diet full liquid Room service appropriate? Yes; Fluid consistency: Thin  Diet effective now             EDUCATION NEEDS:   No education needs have been identified at this time  Skin:  Skin Assessment: Reviewed RN Assessment  Last BM:  10/16/2018 - medium type 5  Height:   Ht Readings from Last 1 Encounters:  10/15/18 6' (1.829 m)   Weight:   Wt Readings from Last 1 Encounters:  10/15/18 98.2 kg   Ideal Body Weight:  80.9 kg  BMI:  Body mass index is 29.35 kg/m.  Estimated Nutritional Needs:   Kcal:  2200-2400  Protein:  110-120 grams  Fluid:  2.2-2.4 L/day  Willey Blade, MS, RD,  LDN Office: [phone] Pager: [phone] After Hours/Weekend Pager: [phone]

## 2018-10-18 DIAGNOSIS — N179 Acute kidney failure, unspecified: Secondary | ICD-10-CM | POA: Diagnosis not present

## 2018-10-18 DIAGNOSIS — K9189 Other postprocedural complications and disorders of digestive system: Secondary | ICD-10-CM | POA: Diagnosis not present

## 2018-10-18 DIAGNOSIS — E876 Hypokalemia: Secondary | ICD-10-CM | POA: Diagnosis not present

## 2018-10-18 LAB — BASIC METABOLIC PANEL
Anion gap: 11 (ref 5–15)
BUN: 10 mg/dL (ref 8–23)
CO2: 23 mmol/L (ref 22–32)
Calcium: 8.7 mg/dL — ABNORMAL LOW (ref 8.9–10.3)
Chloride: 107 mmol/L (ref 98–111)
Creatinine, Ser: 1.07 mg/dL (ref 0.61–1.24)
GFR calc Af Amer: >60 mL/min (ref >60)
GFR calc non Af Amer: >60 mL/min (ref >60)
Glucose, Bld: 121 mg/dL — ABNORMAL HIGH (ref 70–99)
Potassium: 3 mmol/L — ABNORMAL LOW (ref 3.5–5.1)
Sodium: 141 mmol/L (ref 135–145)

## 2018-10-18 LAB — POTASSIUM: Potassium: 3.9 mmol/L (ref 3.5–5.1)

## 2018-10-18 LAB — MAGNESIUM: Magnesium: 1.9 mg/dL (ref 1.7–2.4)

## 2018-10-18 MED ORDER — POTASSIUM CHLORIDE CRYS ER 20 MEQ PO TBCR
40.0000 meq | EXTENDED_RELEASE_TABLET | ORAL | Status: AC
  Administered 2018-10-18 (×2): 40 meq via ORAL
  Filled 2018-10-18 (×2): qty 2

## 2018-10-18 NOTE — Discharge Summary (Signed)
[facility] [place] NAME: Jack Weaver    MR#:  [id number]  DATE OF BIRTH:  April 21, 1956  DATE OF ADMISSION:  10/15/2018   ADMITTING PHYSICIAN: Jude Ojie, MD  DATE OF DISCHARGE: 10/18/2018  PRIMARY CARE PHYSICIAN: [name], MD   ADMISSION DIAGNOSIS:  Ileus ([place]) [K56.7] Abnormal EKG [R94.31] Status post prostatectomy [Z90.79] AKI (acute kidney injury) ([place]) [N17.9] Right lower quadrant abdominal pain [R10.31] Bilious vomiting with nausea [R11.14] DISCHARGE DIAGNOSIS:  Active Problems:   Postoperative ileus ([place])  SECONDARY DIAGNOSIS:   Past Medical History:  Diagnosis Date  . Arthritis   . Cancer ([place])   . Hypertension    HOSPITAL COURSE:  Patient is a 63 year old African-American male with history of prostate cancer status post prostatectomy 4 days ago by Dr. Erlene Weaver and hypertension who presented to the emergency room with nausea and vomiting with abdominal discomfort. Diagnosed with postoperative ileus. Already evaluated by surgery in the emergency room.  1.Postoperative ileus. Patient is s/p Laparoscopy prostatectomy. The patient tolerated soft diet and has no complaints of abdominal pain.  2.History of prostate cancer status post recent laparoscopic prostatectomy Patient due for postop. Stable.  Follow-up with Dr. Rayburn Weaver tomorrow.  3.Hypokalemia Potassium is 3.0, replaced potassium.  K 3.9.  4.Acute kidney injury. Improved with IV fluid.  Hypertension.  Resumed home lisinopril and Cardizem. DISCHARGE CONDITIONS:  Stable, discharge to home today. CONSULTS OBTAINED:   DRUG ALLERGIES:  No Known Allergies DISCHARGE MEDICATIONS:   Allergies as of 10/18/2018   No Known Allergies     Medication List    STOP taking these medications   LORazepam 0.5 MG tablet Commonly known as:  Ativan   traMADol 50 MG tablet Commonly known as:  Ultram     TAKE these medications   acetaminophen  500 MG tablet Commonly known as:  TYLENOL Take 1,000 mg by mouth every 8 (eight) hours as needed for mild pain or moderate pain.   carisoprodol 350 MG tablet Commonly known as:  Soma Take 1 tablet (350 mg total) by mouth 3 (three) times daily as needed.   diclofenac sodium 1 % Gel Commonly known as:  VOLTAREN Use up to 4 times a day to the affected area as needed for pain.   diltiazem 240 MG 24 hr capsule Commonly known as:  CARDIZEM CD TAKE 1 CAPSULE BY MOUTH EVERY DAY What changed:  how much to take   docusate sodium 100 MG capsule Commonly known as:  COLACE Take 1 capsule (100 mg total) by mouth 2 (two) times daily. What changed:    when to take this  reasons to take this   fluticasone 50 MCG/ACT nasal spray Commonly known as:  FLONASE USE 2 SPRAYS EACH NOSTRIL EVERY DAY   lisinopril 40 MG tablet Commonly known as:  ZESTRIL Take 1 tablet (40 mg total) by mouth daily.   multivitamin with minerals Tabs tablet Take 1 tablet by mouth daily.   omeprazole 40 MG capsule Commonly known as:  PriLOSEC Take 1 capsule (40 mg total) by mouth daily. What changed:    when to take this  reasons to take this   oxybutynin 5 MG tablet Commonly known as:  DITROPAN Take 1 tablet (5 mg total) by mouth every 8 (eight) hours as needed for bladder spasms.   oxyCODONE-acetaminophen 5-325 MG tablet Commonly known as:  Percocet Take 1-2 tablets by mouth every 4 (four) hours as needed for moderate pain or severe  pain.        DISCHARGE INSTRUCTIONS:  See AVS.  If you experience worsening of your admission symptoms, develop shortness of breath, life threatening emergency, suicidal or homicidal thoughts you must seek medical attention immediately by calling 911 or calling your MD immediately  if symptoms less severe.  You Must read complete instructions/literature along with all the possible adverse reactions/side effects for all the Medicines you take and that have been prescribed  to you. Take any new Medicines after you have completely understood and accpet all the possible adverse reactions/side effects.   Please note  You were cared for by a hospitalist during your hospital stay. If you have any questions about your discharge medications or the care you received while you were in the hospital after you are discharged, you can call the unit and asked to speak with the hospitalist on call if the hospitalist that took care of you is not available. Once you are discharged, your primary care physician will handle any further medical issues. Please note that NO REFILLS for any discharge medications will be authorized once you are discharged, as it is imperative that you return to your primary care physician (or establish a relationship with a primary care physician if you do not have one) for your aftercare needs so that they can reassess your need for medications and monitor your lab values.    On the day of Discharge:  VITAL SIGNS:  Blood pressure 135/89, pulse (!) 52, temperature 98.2 F (36.8 C), temperature source Oral, resp. rate 18, height 6' (1.829 m), weight 98.2 kg, SpO2 100 %. PHYSICAL EXAMINATION:  GENERAL:  63 y.o.-year-old patient lying in the bed with no acute distress.  EYES: Pupils equal, round, reactive to light and accommodation. No scleral icterus. Extraocular muscles intact.  HEENT: Head atraumatic, normocephalic. Oropharynx and nasopharynx clear.  NECK:  Supple, no jugular venous distention. No thyroid enlargement, no tenderness.  LUNGS: Normal breath sounds bilaterally, no wheezing, rales,rhonchi or crepitation. No use of accessory muscles of respiration.  CARDIOVASCULAR: S1, S2 normal. No murmurs, rubs, or gallops.  ABDOMEN: Soft, non-tender, non-distended. Bowel sounds present. No organomegaly or mass.  EXTREMITIES: No pedal edema, cyanosis, or clubbing.  NEUROLOGIC: Cranial nerves II through XII are intact. Muscle strength 5/5 in all extremities.  Sensation intact. Gait not checked.  PSYCHIATRIC: The patient is alert and oriented x 3.  SKIN: No obvious rash, lesion, or ulcer.  DATA REVIEW:   CBC Recent Labs  Lab 10/16/18 0540  WBC 4.0  HGB 12.1*  HCT 37.1*  PLT 201    Chemistries  Recent Labs  Lab 10/16/18 0540  10/18/18 0517 10/18/18 1310  NA 142   < > 141  --   K 3.2*   < > 3.0* 3.9  CL 112*   < > 107  --   CO2 24   < > 23  --   GLUCOSE 113*   < > 121*  --   BUN 19   < > 10  --   CREATININE 1.07   < > 1.07  --   CALCIUM 8.1*   < > 8.7*  --   MG 2.0   < > 1.9  --   AST 24  --   --   --   ALT 21  --   --   --   ALKPHOS 60  --   --   --   BILITOT 0.9  --   --   --    < > =  values in this interval not displayed.     Microbiology Results  Results for orders placed or performed during the hospital encounter of 10/15/18  SARS Coronavirus 2 (CEPHEID - Performed in [facility] hospital lab), Hosp Order     Status: None   Collection Time: 10/15/18  2:31 PM  Result Value Ref Range Status   SARS Coronavirus 2 NEGATIVE NEGATIVE Final    Comment: (NOTE) If result is NEGATIVE SARS-CoV-2 target nucleic acids are NOT DETECTED. The SARS-CoV-2 RNA is generally detectable in upper and lower  respiratory specimens during the acute phase of infection. The lowest  concentration of SARS-CoV-2 viral copies this assay can detect is 250  copies / mL. A negative result does not preclude SARS-CoV-2 infection  and should not be used as the sole basis for treatment or other  patient management decisions.  A negative result may occur with  improper specimen collection / handling, submission of specimen other  than nasopharyngeal swab, presence of viral mutation(s) within the  areas targeted by this assay, and inadequate number of viral copies  (<250 copies / mL). A negative result must be combined with clinical  observations, patient history, and epidemiological information. If result is POSITIVE SARS-CoV-2 target nucleic acids  are DETECTED. The SARS-CoV-2 RNA is generally detectable in upper and lower  respiratory specimens dur ing the acute phase of infection.  Positive  results are indicative of active infection with SARS-CoV-2.  Clinical  correlation with patient history and other diagnostic information is  necessary to determine patient infection status.  Positive results do  not rule out bacterial infection or co-infection with other viruses. If result is PRESUMPTIVE POSTIVE SARS-CoV-2 nucleic acids MAY BE PRESENT.   A presumptive positive result was obtained on the submitted specimen  and confirmed on repeat testing.  While 2019 novel coronavirus  (SARS-CoV-2) nucleic acids may be present in the submitted sample  additional confirmatory testing may be necessary for epidemiological  and / or clinical management purposes  to differentiate between  SARS-CoV-2 and other Sarbecovirus currently known to infect humans.  If clinically indicated additional testing with an alternate test  methodology [phone]) is advised. The SARS-CoV-2 RNA is generally  detectable in upper and lower respiratory sp ecimens during the acute  phase of infection. The expected result is Negative. Fact Sheet for Patients:  StrictlyIdeas.no Fact Sheet for Healthcare Providers: BankingDealers.co.za This test is not yet approved or cleared by the [place] FDA and has been authorized for detection and/or diagnosis of SARS-CoV-2 by FDA under an Emergency Use Authorization (EUA).  This EUA will remain in effect (meaning this test can be used) for the duration of the COVID-19 declaration under Section 564(b)(1) of the Act, 21 U.S.C. section 360bbb-3(b)(1), unless the authorization is terminated or revoked sooner. Performed at [facility], [address], [place], [place] [address]     RADIOLOGY:  No results found.   Management plans discussed with the patient, family  and they are in agreement.  CODE STATUS: Full Code   TOTAL TIME TAKING CARE OF THIS PATIENT: 32 minutes.    Demetrios Loll M.D on 10/18/2018 at 1:39 PM  Between 7am to 6pm - Pager - [phone]  After 6pm go to [email or web address] - password [email or web address] [facility]  [facility]  Office  [phone]  CC: Primary care physician; [name], MD   Note: This dictation was prepared with Dragon dictation along with smaller phrase technology. Any transcriptional errors that result from  this process are unintentional.

## 2018-10-18 NOTE — Progress Notes (Signed)
Discharge order received. Patient is alert and oriented. Vital signs stable . No signs of acute distress. Discharge instructions given. Patient verbalized understanding. No other issues noted at this time.   

## 2018-10-19 ENCOUNTER — Other Ambulatory Visit: Payer: Self-pay

## 2018-10-19 ENCOUNTER — Ambulatory Visit (INDEPENDENT_AMBULATORY_CARE_PROVIDER_SITE_OTHER): Payer: 59

## 2018-10-19 ENCOUNTER — Telehealth: Payer: Self-pay | Admitting: Urology

## 2018-10-19 ENCOUNTER — Telehealth: Payer: Self-pay

## 2018-10-19 DIAGNOSIS — C61 Malignant neoplasm of prostate: Secondary | ICD-10-CM

## 2018-10-19 NOTE — Telephone Encounter (Signed)
No HFU scheduled.  

## 2018-10-19 NOTE — Progress Notes (Signed)
Catheter Removal  Patient is present today for a catheter removal.  80ml of water was drained from the balloon. A 18FR foley cath was removed from the bladder no complications were noted. Patient tolerated well. Pt advised that urinary leakage is normal, pt brought depends brief with him today. Encouraged pt to continue to wear these.  Preformed by: Gordy Clement, CMA   Follow up/ Additional notes: F/U as scheduled for postop

## 2018-10-19 NOTE — Telephone Encounter (Signed)
Please advise 

## 2018-10-19 NOTE — Telephone Encounter (Signed)
Transition Care Management Follow-up Telephone Call  Date of discharge and from where: [facility] on 10/18/18  How have you been since you were released from the hospital? Doing better but still sore at surgery site on right side of abdomen. Pain is getting better until pt has to cough or put pressure on the area. Declined redness or warmth at wound site, swelling, fever or n/v/d.   Any questions or concerns? No   Items Reviewed:  Did the pt receive and understand the discharge instructions provided? Yes   Medications obtained and verified? Yes   Any new allergies since your discharge? No   Dietary orders reviewed? Yes  Do you have support at home? Yes   Other (ie: DME, Home Health, etc): None currently- catheter removed this AM.   Functional Questionnaire: (I = Independent and D = Dependent)  Bathing/Dressing- I   Meal Prep- I  Eating- I  Maintaining continence- D, uses depends for bladder control.   Transferring/Ambulation- I  Managing Meds- I   Follow up appointments reviewed:    PCP Hospital f/u appt confirmed? Pt declined scheduling a HFU at this time. Pt plans to f/u with urologist only.    [facility] f/u appt confirmed? Yes    Are transportation arrangements needed? No   If their condition worsens, is the pt aware to call  their PCP or go to the ED? Yes  Was the patient provided with contact information for the PCP's office or ED? Yes  Was the pt encouraged to call back with questions or concerns? Yes

## 2018-10-21 ENCOUNTER — Other Ambulatory Visit: Payer: 59

## 2018-10-21 NOTE — Progress Notes (Signed)
Tumor Board Documentation  Jack Weaver was presented by Dr [name] at our Tumor Board on 10/21/2018, which included representatives from medical oncology, radiation oncology, surgical, radiology, pathology, navigation, internal medicine, genetics, research, palliative care, pulmonology.  Jack Weaver currently presents as an external consult, for discussion, for Jack Weaver, for new positive pathology with history of the following treatments: surgical intervention(s).  Additionally, we reviewed previous medical and familial history, history of present illness, and recent lab results along with all available histopathologic and imaging studies. The tumor board considered available treatment options and made the following recommendations: Post-operative radiation therapy ADT, Whole Pelvis Radiation Therapy  The following procedures/referrals were also placed: No orders of the defined types were placed in this encounter.   Clinical Trial Status: not discussed   Staging used: AJCC Stage Group  AJCC Staging: T: T3a N: N1   Group: Prostate Cancer G4, [place] 3+5=8   National site-specific guidelines NCCN were discussed with respect to the case.  Tumor board is a meeting of clinicians from various specialty areas who evaluate and discuss patients for whom a multidisciplinary approach is being considered. Final determinations in the plan of care are those of the provider(s). The responsibility for follow up of recommendations given during tumor board is that of the provider.   Today's extended care, comprehensive team conference, Jack Weaver was not present for the discussion and was not examined.   Multidisciplinary Tumor Board is a multidisciplinary case peer review process.  Decisions discussed in the Multidisciplinary Tumor Board reflect the opinions of the specialists present at the conference without having examined the patient.  Ultimately, treatment and diagnostic decisions rest with the primary  provider(s) and the patient.

## 2018-10-28 ENCOUNTER — Telehealth: Payer: Self-pay | Admitting: Urology

## 2018-10-28 NOTE — Telephone Encounter (Signed)
Spoke with patient and he states that pain started yesterday after getting up from the dinner table. He describes a sharpe shooting pain deep inside. Patient denies fever, chills, nausea and vomiting. He states he is having regular bowel movements and pain is not constant. Pain only occurs with movement. Patient was instructed per Dr. [name] to try NSAIDs 800mg  every 8 hours and heat pack if possible to see if this helps his pain. The pain sounds musculoskeletal in nature. Patient will call back this afternoon to update [place] on how he is feeling

## 2018-10-28 NOTE — Telephone Encounter (Signed)
Patient called back this afternoon stating he is feeling much better and will continue the NSAIDs to help with his pain. If it worsens he will call back

## 2018-10-28 NOTE — Telephone Encounter (Signed)
Pt calling the office stating he is in a lot of really bad pain, that started yesterday. pt states he recently had surgery and would like to talk to someone soon. Please advise pt at [phone].

## 2018-11-01 ENCOUNTER — Other Ambulatory Visit: Payer: Self-pay

## 2018-11-01 ENCOUNTER — Telehealth: Payer: Self-pay

## 2018-11-01 ENCOUNTER — Encounter: Payer: Self-pay | Admitting: Urology

## 2018-11-01 ENCOUNTER — Ambulatory Visit (INDEPENDENT_AMBULATORY_CARE_PROVIDER_SITE_OTHER): Payer: 59 | Admitting: Urology

## 2018-11-01 VITALS — BP 158/93 | HR 76 | Ht 72.0 in | Wt 207.0 lb

## 2018-11-01 DIAGNOSIS — R1031 Right lower quadrant pain: Secondary | ICD-10-CM

## 2018-11-01 DIAGNOSIS — C61 Malignant neoplasm of prostate: Secondary | ICD-10-CM

## 2018-11-01 NOTE — Progress Notes (Signed)
11/01/2018 1:25 PM   Jack Weaver Jul 14, 1955 [id number]  Referring provider: [name], MD [address] [place], [place] [address]  Chief Complaint  Patient presents with  . Post-op Problem    HPI: 63 year old male with a personal history of prostate cancer status post non-nerve sparing robotic prostatectomy bilateral pelvic lymph node dissection on 10/11/2018 who returns to the office with complaints of pain in his right lower quadrant incision.  Notably, his postoperative course was complicated by ileus requiring readmission and bowel rest.  This ultimately resolved spontaneously and he was able to be discharged home.  CT scan at the time was most consistent with ileus.  He called our office late last week with pain at his right lower incision site exacerbated with physical activity.  This resolved with rest.  He was advised to try ibuprofen for pain control which was ultimately helpful and his pain subsided.  Over the weekend, he called the after-hours triage line complaining of similar pain in his right lower abdomen radiating to his right flank.  He was advised to come into the office today for an exam.  He has been having normal daily bowel movements.  No nausea or vomiting.  He is eating well.  He does have incontinence but has seen some improvement since his catheter was removed.  He continues to have intermittent right lower quadrant pain.  Again he reports today that this is exacerbated with physical activity and resolves with rest.  It is improved by NSAIDs.  No redness or draining around the incision site.   PMH: Past Medical History:  Diagnosis Date  . Arthritis   . Cancer ([place])   . Hypertension     Surgical History: Past Surgical History:  Procedure Laterality Date  . APPENDECTOMY     At age 23  . HEMORROIDECTOMY  1930  . PELVIC LYMPH NODE DISSECTION Bilateral 10/11/2018   Procedure: PELVIC LYMPH NODE DISSECTION;  Surgeon: Hollice Espy, MD;  Location: [facility] ORS;  Service: Urology;  Laterality: Bilateral;  . QUADRICEPS TENDON REPAIR Left 09/09/2017   Procedure: REPAIR QUADRICEP TENDON;  Surgeon: Earnestine Leys, MD;  Location: [facility] ORS;  Service: Orthopedics;  Laterality: Left;  . ROBOT ASSISTED LAPAROSCOPIC RADICAL PROSTATECTOMY N/A 10/11/2018   Procedure: ROBOTIC ASSISTED LAPAROSCOPIC RADICAL PROSTATECTOMY;  Surgeon: Hollice Espy, MD;  Location: [facility] ORS;  Service: Urology;  Laterality: N/A;    Home Medications:  Allergies as of 11/01/2018   No Known Allergies     Medication List       Accurate as of November 01, 2018  1:25 PM. If you have any questions, ask your nurse or doctor.        acetaminophen 500 MG tablet Commonly known as:  TYLENOL Take 1,000 mg by mouth every 8 (eight) hours as needed for mild pain or moderate pain.   carisoprodol 350 MG tablet Commonly known as:  Soma Take 1 tablet (350 mg total) by mouth 3 (three) times daily as needed.   diclofenac sodium 1 % Gel Commonly known as:  VOLTAREN Use up to 4 times a day to the affected area as needed for pain.   diltiazem 240 MG 24 hr capsule Commonly known as:  CARDIZEM CD TAKE 1 CAPSULE BY MOUTH EVERY DAY What changed:  how much to take   docusate sodium 100 MG capsule Commonly known as:  COLACE Take 1 capsule (100 mg total) by mouth 2 (two) times daily. What changed:  when to take this  reasons to take this   fluticasone 50 MCG/ACT nasal spray Commonly known as:  FLONASE USE 2 SPRAYS EACH NOSTRIL EVERY DAY   lisinopril 40 MG tablet Commonly known as:  ZESTRIL Take 1 tablet (40 mg total) by mouth daily.   multivitamin with minerals Tabs tablet Take 1 tablet by mouth daily.   omeprazole 40 MG capsule Commonly known as:  PriLOSEC Take 1 capsule (40 mg total) by mouth daily. What changed:    when to take this  reasons to take this   oxybutynin 5 MG tablet Commonly known as:  DITROPAN Take 1 tablet (5 mg total) by mouth  every 8 (eight) hours as needed for bladder spasms.   oxyCODONE-acetaminophen 5-325 MG tablet Commonly known as:  Percocet Take 1-2 tablets by mouth every 4 (four) hours as needed for moderate pain or severe pain.       Allergies: No Known Allergies  Family History: Family History  Problem Relation Age of Onset  . Heart attack Mother   . Stroke Father   . Asthma Father   . Cancer Brother   . Diabetes Brother   . Prostate cancer Neg Hx   . Kidney cancer Neg Hx   . Bladder Cancer Neg Hx     Social History:  reports that he has been smoking cigars. He has never used smokeless tobacco. He reports current alcohol use. He reports that he does not use drugs.  ROS: UROLOGY Frequent Urination?: Yes Hard to postpone urination?: No Burning/pain with urination?: Yes Get up at night to urinate?: No Leakage of urine?: Yes Urine stream starts and stops?: No Trouble starting stream?: No Do you have to strain to urinate?: No Blood in urine?: No Urinary tract infection?: No Sexually transmitted disease?: No Injury to kidneys or bladder?: No Painful intercourse?: No Weak stream?: No Erection problems?: No Penile pain?: No  Gastrointestinal Nausea?: No Vomiting?: No Indigestion/heartburn?: No Diarrhea?: No Constipation?: No  Constitutional Fever: No Night sweats?: No Weight loss?: Yes Fatigue?: No  Skin Skin rash/lesions?: No Itching?: No  Eyes Blurred vision?: No Double vision?: No  Ears/Nose/Throat Sore throat?: No Sinus problems?: No  Hematologic/Lymphatic Swollen glands?: No Easy bruising?: No  Cardiovascular Leg swelling?: No Chest pain?: No  Respiratory Cough?: No Shortness of breath?: No  Endocrine Excessive thirst?: No  Musculoskeletal Back pain?: Yes Joint pain?: No  Neurological Headaches?: No Dizziness?: No  Psychologic Depression?: No Anxiety?: No  Physical Exam: BP (!) 158/93   Pulse 76   Ht 6' (1.829 m)   Wt 207 lb (93.9  kg)   BMI 28.07 kg/m   Constitutional:  Alert and oriented, No acute distress.  Patient does not appear to be in any pain or distress. HEENT: [place] AT, moist mucus membranes.  Trachea midline, no masses. Cardiovascular: No clubbing, cyanosis, or edema. Respiratory: Normal respiratory effort, no increased work of breathing. GI: Abdomen is soft, nontender, no rebound or guarding.  Nondistended.  No erythema or drainage around incisions which are healing well.  No obvious focal tenderness around right lower quadrant incision, no palpable defect or hernia at this location. GU: Uncircumcised phallus.  Bilateral descended testicles, nontender.  No palpable inguinal adenopathy or tenderness. Skin: No rashes, bruises or suspicious lesions. Neurologic: Grossly intact, no focal deficits, moving all 4 extremities. Psychiatric: Mildly anxious and tearful at times  Laboratory Data: Lab Results  Component Value Date   WBC 4.0 10/16/2018   HGB 12.1 (L) 10/16/2018  HCT 37.1 (L) 10/16/2018   MCV 85.9 10/16/2018   PLT 201 10/16/2018    Lab Results  Component Value Date   CREATININE 1.07 10/18/2018     Lab Results  Component Value Date   HGBA1C 5.7 (H) 09/04/2017    Pertinent Imaging: CT scan from 10/16/2018 was personally reviewed again today  Assessment & Plan:    1. Right lower quadrant abdominal pain Nature of pain most consistent with musculoskeletal discomfort Exam today was extremely benign and all vitals normal Low suspicion for underlying significant pathology Recommend continued supportive care, alternating NSAIDs and Tylenol as needed Mr. Boorman is extremely anxious and I suspect this is partially contributing to his perceived degree of discomfort, appears to be in absolutely no distress today-offered anxiolytic but declined Discussed stress management today  2. Prostate cancer [facility]) Pathology reviewed, PT3N1 disease Plan for PSA on day prior to 1 month follow-up We will likely  recommend adjuvant radiation once his continence is healed as well as ADT Consider Axumin PET scan if his PSA remains detectable He is agreeable this plan   Follow-up next week as scheduled for routine postop visit  Hollice Espy, MD  [facility] [address] [place], [place] [address] [phone]

## 2018-11-01 NOTE — Telephone Encounter (Signed)
Patient was contacted to follow up on after hours nurse line call. He did not go to ER but is still having pain. Per Dr. [name] patient added on for a visit today in clinic

## 2018-11-08 ENCOUNTER — Other Ambulatory Visit: Payer: Self-pay

## 2018-11-08 ENCOUNTER — Other Ambulatory Visit: Payer: 59

## 2018-11-08 DIAGNOSIS — C61 Malignant neoplasm of prostate: Secondary | ICD-10-CM

## 2018-11-09 ENCOUNTER — Encounter: Payer: Self-pay | Admitting: Urology

## 2018-11-09 ENCOUNTER — Ambulatory Visit (INDEPENDENT_AMBULATORY_CARE_PROVIDER_SITE_OTHER): Payer: 59 | Admitting: Urology

## 2018-11-09 VITALS — BP 167/82 | HR 64 | Ht 73.0 in | Wt 214.0 lb

## 2018-11-09 DIAGNOSIS — C61 Malignant neoplasm of prostate: Secondary | ICD-10-CM

## 2018-11-09 DIAGNOSIS — N393 Stress incontinence (female) (male): Secondary | ICD-10-CM

## 2018-11-09 LAB — PSA: Prostate Specific Ag, Serum: 0.4 ng/mL (ref 0.0–4.0)

## 2018-11-09 NOTE — Patient Instructions (Signed)

## 2018-11-09 NOTE — Progress Notes (Signed)
11/09/2018 11:52 AM   Jack Weaver 20-May-1956 [id number]  Referring provider: [name], MD [address] [place],  [place] [address]  Chief Complaint  Patient presents with  . Routine Post Op    HPI: 63 year old male with personal history of high risk prostate cancer status post radical robotic prostatectomy (bilateral non-nerve sparing) with pelvic lymph node dissection on 10/11/18 who returns today for routine postop follow-up.  Postoperative course was complicated by ileus which is now resolved.  Surgical pathology was consistent with Gleason 3+5 disease, pT3a N1.  He was noted to have extraprostatic extension with positive posterior margins both left and right.  He is also noted to have 2 of 3 lymph nodes on the left with focal metastatic disease.  0 of 3 lymph nodes on the right.  No bladder neck involvement.  No seminal vesicle involvement.  Postoperative PSA from 11/08/2018 0.4.  Preoperative prostate biopsy consistent with 4+3 disease, iPSA 7.4.  Preop staging CT pelvis negative for metastatic disease.  Overall today, he reports that he is improving.  He no longer has right lower quadrant pain.  He is eating well.  He started to walk and exercise again.  He is interested in going back to work on Monday.  He is slowly regaining some urinary control/continence.  He started doing pelvic floor exercises again.  PMH: Past Medical History:  Diagnosis Date  . Arthritis   . Cancer ([place])   . Hypertension     Surgical History: Past Surgical History:  Procedure Laterality Date  . APPENDECTOMY     At age 1  . HEMORROIDECTOMY  1930  . PELVIC LYMPH NODE DISSECTION Bilateral 10/11/2018   Procedure: PELVIC LYMPH NODE DISSECTION;  Surgeon: Hollice Espy, MD;  Location: [facility];  Service: Urology;  Laterality: Bilateral;  . QUADRICEPS TENDON REPAIR Left 09/09/2017   Procedure: REPAIR QUADRICEP TENDON;  Surgeon: Earnestine Leys, MD;  Location: [facility];   Service: Orthopedics;  Laterality: Left;  . ROBOT ASSISTED LAPAROSCOPIC RADICAL PROSTATECTOMY N/A 10/11/2018   Procedure: ROBOTIC ASSISTED LAPAROSCOPIC RADICAL PROSTATECTOMY;  Surgeon: Hollice Espy, MD;  Location: [facility];  Service: Urology;  Laterality: N/A;    Home Medications:  Allergies as of 11/09/2018   No Known Allergies     Medication List       Accurate as of November 09, 2018 11:52 AM. If you have any questions, ask your nurse or doctor.        STOP taking these medications   acetaminophen 500 MG tablet Commonly known as: TYLENOL Stopped by: Hollice Espy, MD   carisoprodol 350 MG tablet Commonly known as: Manufacturing engineer Stopped by: Hollice Espy, MD   diclofenac sodium 1 % Gel Commonly known as: VOLTAREN Stopped by: Hollice Espy, MD   fluticasone 50 MCG/ACT nasal spray Commonly known as: FLONASE Stopped by: Hollice Espy, MD   omeprazole 40 MG capsule Commonly known as: PriLOSEC Stopped by: Hollice Espy, MD   oxybutynin 5 MG tablet Commonly known as: DITROPAN Stopped by: Hollice Espy, MD   oxyCODONE-acetaminophen 5-325 MG tablet Commonly known as: Percocet Stopped by: Hollice Espy, MD     TAKE these medications   diltiazem 240 MG 24 hr capsule Commonly known as: CARDIZEM CD TAKE 1 CAPSULE BY MOUTH EVERY DAY What changed: how much to take   docusate sodium 100 MG capsule Commonly known as: COLACE Take 1 capsule (100 mg total) by mouth 2 (two) times daily. What changed:   when to take  this  reasons to take this   lisinopril 40 MG tablet Commonly known as: ZESTRIL Take 1 tablet (40 mg total) by mouth daily.   multivitamin with minerals Tabs tablet Take 1 tablet by mouth daily.       Allergies: No Known Allergies  Family History: Family History  Problem Relation Age of Onset  . Heart attack Mother   . Stroke Father   . Asthma Father   . Cancer Brother   . Diabetes Brother   . Prostate cancer Neg Hx   . Kidney cancer Neg Hx   .  Bladder Cancer Neg Hx     Social History:  reports that he has been smoking cigars. He has never used smokeless tobacco. He reports current alcohol use. He reports that he does not use drugs.  ROS: UROLOGY Frequent Urination?: Yes Hard to postpone urination?: Yes Burning/pain with urination?: No Get up at night to urinate?: Yes Leakage of urine?: Yes Urine stream starts and stops?: No Trouble starting stream?: No Do you have to strain to urinate?: No Blood in urine?: No Urinary tract infection?: No Sexually transmitted disease?: No Injury to kidneys or bladder?: No Painful intercourse?: No Weak stream?: No Erection problems?: No Penile pain?: No  Gastrointestinal Nausea?: No Vomiting?: No Indigestion/heartburn?: No Diarrhea?: No Constipation?: No  Constitutional Night sweats?: No Weight loss?: No Fatigue?: No  Skin Skin rash/lesions?: No Itching?: No  Eyes Blurred vision?: No Double vision?: No  Ears/Nose/Throat Sore throat?: No Sinus problems?: No  Hematologic/Lymphatic Swollen glands?: No Easy bruising?: No  Cardiovascular Leg swelling?: No Chest pain?: No  Respiratory Cough?: No Shortness of breath?: No  Endocrine Excessive thirst?: No  Musculoskeletal Back pain?: No Joint pain?: No  Neurological Headaches?: No Dizziness?: No  Psychologic Depression?: No Anxiety?: No  Physical Exam: BP (!) 167/82   Pulse 64   Ht 6\' 1"  (1.854 m)   Wt 214 lb (97.1 kg)   BMI 28.23 kg/m   Constitutional:  Alert and oriented, No acute distress. HEENT: [place] AT, moist mucus membranes.  Trachea midline, no masses. Cardiovascular: No clubbing, cyanosis, or edema. Respiratory: Normal respiratory effort, no increased work of breathing. Skin: No rashes, bruises or suspicious lesions. Neurologic: Grossly intact, no focal deficits, moving all 4 extremities. Psychiatric: Normal mood and affect.  Laboratory Data: Component     Latest Ref Rng & Units  07/02/2016 09/04/2017 01/05/2018 01/29/2018  Prostate Specific Ag, Serum     0.0 - 4.0 ng/mL 3.8 4.3 (H) 6.1 (H) 5.0 (H)   Component     Latest Ref Rng & Units 05/03/2018 11/08/2018  Prostate Specific Ag, Serum     0.0 - 4.0 ng/mL 7.4 (H) 0.4    Pertinent Imaging: No new interval imaging  Assessment & Plan:    1. Prostate cancer ([place]) pT3a N1 Gleason 3+5 prostate cancer s/p radical prostatectomy with bilateral pelvic lymph node dissection on 10/2018 Postoperative PSA remains detectable concerning for disease persistence We discussed the implications of this in detail along with additional treatment options Based on NCCN guidelines, would strongly recommend initiation of ADT-side effects discussed at length and he was provided with additional information I recommended Axumin PET scan to ensure that he does not have evidence of metastatic disease outside of the pelvis In addition to the above given his age excellent health, recommended consideration of adjuvant radiation to the whole pelvis as discussed at tumor board once he is had a chance for more meaningful recovery of his continence He is  agreeable this plan He will follow-up with me in 3 months with PSA prior - NM PET (AXUMIN) [place]; Future  2. Stress incontinence of urine Anticipate continued slow improvement Encouraged pelvic floor exercises Offered referral back to physical therapy but declined He is very motivated to do exercises on his own   Axumin PET scan/ADT Depo x92-month  He will follow-up with me in 3 months with PSA on the same day  Hollice Espy, MD  [facility] [address] [place], [place] [address] [phone]

## 2018-11-24 ENCOUNTER — Ambulatory Visit: Payer: 59

## 2018-11-24 ENCOUNTER — Ambulatory Visit (INDEPENDENT_AMBULATORY_CARE_PROVIDER_SITE_OTHER): Payer: 59 | Admitting: Family Medicine

## 2018-11-24 ENCOUNTER — Other Ambulatory Visit: Payer: Self-pay

## 2018-11-24 DIAGNOSIS — Z23 Encounter for immunization: Secondary | ICD-10-CM

## 2018-11-26 NOTE — Progress Notes (Signed)
Vaccine only

## 2018-12-09 ENCOUNTER — Ambulatory Visit
Admission: RE | Admit: 2018-12-09 | Discharge: 2018-12-09 | Disposition: A | Discharge status: Home / Self Care | Payer: 59 | Source: Ambulatory Visit | Attending: Urology | Admitting: Urology

## 2018-12-09 ENCOUNTER — Other Ambulatory Visit: Payer: Self-pay

## 2018-12-09 DIAGNOSIS — C61 Malignant neoplasm of prostate: Secondary | ICD-10-CM | POA: Insufficient documentation

## 2018-12-09 MED ORDER — AXUMIN (FLUCICLOVINE F 18) INJECTION
10.7400 | Freq: Once | INTRAVENOUS | Status: AC | PRN
  Administered 2018-12-09: 10.74 via INTRAVENOUS

## 2018-12-10 ENCOUNTER — Telehealth: Payer: Self-pay | Admitting: Urology

## 2018-12-10 NOTE — Telephone Encounter (Signed)
Called patient to review Axumin PET scan.  There is no evidence of macroscopic disease which is excellent news.  He is pleased with this.  Incidental pelvic seromas appreciated, asymptomatic.  We discussed Lupron again today.  Side effects discussed.  This was also discussed in clinic.  He will come in next week for this 7-month injection will see him as scheduled with a PSA.  He understands that he would be a candidate for radiation with curative intent down the road once he recovers his continence.  Follow-up next week for Lupron (3 month depo).  Otherwise follow-up with me as scheduled.  All questions answered.  Hollice Espy, MD

## 2018-12-13 NOTE — Telephone Encounter (Signed)
FAXED LUPRON PA  MICHELLE

## 2018-12-17 ENCOUNTER — Telehealth: Payer: Self-pay

## 2018-12-17 ENCOUNTER — Ambulatory Visit (INDEPENDENT_AMBULATORY_CARE_PROVIDER_SITE_OTHER): Payer: 59 | Admitting: Family Medicine

## 2018-12-17 ENCOUNTER — Other Ambulatory Visit: Payer: Self-pay

## 2018-12-17 ENCOUNTER — Telehealth: Payer: Self-pay | Admitting: Family Medicine

## 2018-12-17 ENCOUNTER — Telehealth: Payer: Self-pay | Admitting: Urology

## 2018-12-17 DIAGNOSIS — N5231 Erectile dysfunction following radical prostatectomy: Secondary | ICD-10-CM

## 2018-12-17 DIAGNOSIS — C61 Malignant neoplasm of prostate: Secondary | ICD-10-CM | POA: Diagnosis not present

## 2018-12-17 MED ORDER — LEUPROLIDE ACETATE (3 MONTH) 22.5 MG IM KIT
22.5000 mg | PACK | Freq: Once | INTRAMUSCULAR | Status: AC
  Administered 2018-12-17: 22.5 mg via INTRAMUSCULAR

## 2018-12-17 NOTE — Progress Notes (Signed)
Lupron IM Injection   Due to Prostate Cancer patient is present today for a Lupron Injection.  Medication: Lupron 3 month Dose: 22.5 mg  Location: right upper outer buttocks Lot: [id number] Exp: 11/01/2020  Patient tolerated well, no complications were noted  Performed by: Elberta Leatherwood, CMA  Follow up: As scheduled

## 2018-12-17 NOTE — Telephone Encounter (Signed)
Patient was in office today for his Lupron injection. He is questioning if he can try Cialis or something for ED?

## 2018-12-17 NOTE — Telephone Encounter (Signed)
PA FOR Roylene Reason [id number] 12-13-18 THRU 12-13-2019

## 2018-12-17 NOTE — Telephone Encounter (Signed)
Patient is asking for a prescription for Cialis to be send in to [facility] on [address] please. CB#:[id number]

## 2018-12-19 NOTE — Telephone Encounter (Signed)
He is more than welcome to try sildenafil 20 mg titrate up to 100 mg as needed.  Please call this in for him.  Please discussed possible side effects including flushing and headaches.  Low risk of vision disturbance.  Best if taken on empty stomach 1 hour prior to sexual activity.  It is not uncommon for this to not work this soon after prostatectomy.  We can discuss intracavernosal injections at his follow-up visit.  Hollice Espy, MD

## 2018-12-20 MED ORDER — SILDENAFIL CITRATE 20 MG PO TABS: ORAL_TABLET | ORAL | 3 refills | Status: DC

## 2018-12-20 NOTE — Telephone Encounter (Signed)
Called pt's number, wife answers. Informed her of the information below. Advised wife of getting medication at [facility] or [facility] for better price. She declines and wants RX sent to [facility]. RX sent. Mychart sent to patient as well.

## 2018-12-22 ENCOUNTER — Other Ambulatory Visit: Payer: Self-pay

## 2018-12-22 MED ORDER — TADALAFIL 5 MG PO TABS: 5.0000 mg | ORAL_TABLET | Freq: Every day | ORAL | 11 refills | Status: DC

## 2018-12-22 NOTE — Telephone Encounter (Signed)
Does he want the daily pill or the as needed dose?

## 2018-12-22 NOTE — Telephone Encounter (Signed)
5 mg daily tadalafil,#30,11rf

## 2018-12-22 NOTE — Telephone Encounter (Signed)
Patient would like the daily dose of the Cialis.

## 2019-01-25 NOTE — Telephone Encounter (Signed)
error 

## 2019-02-03 ENCOUNTER — Other Ambulatory Visit: Payer: Self-pay | Admitting: *Deleted

## 2019-02-03 DIAGNOSIS — R972 Elevated prostate specific antigen [PSA]: Secondary | ICD-10-CM

## 2019-02-04 ENCOUNTER — Other Ambulatory Visit: Payer: Self-pay

## 2019-02-04 ENCOUNTER — Other Ambulatory Visit: Payer: 59

## 2019-02-04 DIAGNOSIS — R972 Elevated prostate specific antigen [PSA]: Secondary | ICD-10-CM

## 2019-02-05 LAB — PSA: Prostate Specific Ag, Serum: <0.1 ng/mL (ref 0.0–4.0)

## 2019-02-08 ENCOUNTER — Other Ambulatory Visit: Payer: Self-pay | Admitting: Family Medicine

## 2019-02-08 DIAGNOSIS — K2970 Gastritis, unspecified, without bleeding: Secondary | ICD-10-CM

## 2019-02-09 ENCOUNTER — Encounter: Payer: Self-pay | Admitting: Urology

## 2019-02-09 ENCOUNTER — Other Ambulatory Visit: Payer: Self-pay

## 2019-02-09 ENCOUNTER — Ambulatory Visit (INDEPENDENT_AMBULATORY_CARE_PROVIDER_SITE_OTHER): Payer: 59 | Admitting: Urology

## 2019-02-09 VITALS — BP 166/92 | HR 63 | Ht 73.0 in | Wt 215.0 lb

## 2019-02-09 DIAGNOSIS — N393 Stress incontinence (female) (male): Secondary | ICD-10-CM | POA: Diagnosis not present

## 2019-02-09 DIAGNOSIS — C61 Malignant neoplasm of prostate: Secondary | ICD-10-CM

## 2019-02-09 DIAGNOSIS — N5231 Erectile dysfunction following radical prostatectomy: Secondary | ICD-10-CM | POA: Diagnosis not present

## 2019-02-09 NOTE — Progress Notes (Signed)
02/09/2019 11:08 AM   Jack Weaver 19-Oct-1955 [id number]  Referring provider: [name], MD [address] [place],  [place] [address]  Chief Complaint  Patient presents with  . Follow-up    HPI: 63 year old male with a personal history of high risk prostate cancer urgency for routine follow-up.  He underwent radical prostatectomy non-nerve sparing with bilateral pelvic lymph node dissection on 10/01/2018.  Surgical pathology was consistent with Gleason 3+5 disease, pT3a N1.  He was noted to have extraprostatic extension with positive posterior margins both left and right.  He is also noted to have 2 of 3 lymph nodes on the left with focal metastatic disease.  0 of 3 lymph nodes on the right.  No bladder neck involvement.  No seminal vesicle involvement.  Postoperative PSA from 11/08/2018 0.4.  Staging with Axiom PET scan was negative for metastatic disease.  He was subsequently started on Lupron.  He has been tolerating this very well.  He has had no hot flashes or any other side effects.  He is taking calcium and vitamin D.  His PSA is currently undetectable.  In terms of continence, he is now down to 1 day/day.  He sees improvement daily.  He still is wet especially with physical activity.  He reports that he is seeing progress as he is normally dry in the morning and early afternoon and starts to become wet as the evening progresses.  The time of his wetness has moved from 2 PM now to closer to 6 PM.  He is concerned today about his erections.  He is tried daily Cialis as well as sildenafil without response.  He is concerned because he did have a erections in the for several weeks postoperatively with the sepsis since subsided.   PMH: Past Medical History:  Diagnosis Date  . Arthritis   . Cancer ([place])   . Hypertension     Surgical History: Past Surgical History:  Procedure Laterality Date  . APPENDECTOMY     At age 18  . HEMORROIDECTOMY  1930  .  PELVIC LYMPH NODE DISSECTION Bilateral 10/11/2018   Procedure: PELVIC LYMPH NODE DISSECTION;  Surgeon: Hollice Espy, MD;  Location: [facility];  Service: Urology;  Laterality: Bilateral;  . QUADRICEPS TENDON REPAIR Left 09/09/2017   Procedure: REPAIR QUADRICEP TENDON;  Surgeon: Earnestine Leys, MD;  Location: [facility];  Service: Orthopedics;  Laterality: Left;  . ROBOT ASSISTED LAPAROSCOPIC RADICAL PROSTATECTOMY N/A 10/11/2018   Procedure: ROBOTIC ASSISTED LAPAROSCOPIC RADICAL PROSTATECTOMY;  Surgeon: Hollice Espy, MD;  Location: [facility];  Service: Urology;  Laterality: N/A;    Home Medications:  Allergies as of 02/09/2019   No Known Allergies     Medication List       Accurate as of February 09, 2019 11:59 PM. If you have any questions, ask your nurse or doctor.        STOP taking these medications   docusate sodium 100 MG capsule Commonly known as: COLACE Stopped by: Hollice Espy, MD   tadalafil 5 MG tablet Commonly known as: CIALIS Stopped by: Hollice Espy, MD     TAKE these medications   diltiazem 240 MG 24 hr capsule Commonly known as: CARDIZEM CD TAKE 1 CAPSULE BY MOUTH EVERY DAY What changed: how much to take   lisinopril 40 MG tablet Commonly known as: ZESTRIL Take 1 tablet (40 mg total) by mouth daily.   multivitamin with minerals Tabs tablet Take 1 tablet by mouth daily.  omeprazole 40 MG capsule Commonly known as: PRILOSEC Take 1 capsule (40 mg total) by mouth daily as needed (acid reflux/indigestion).   sildenafil 20 MG tablet Commonly known as: REVATIO Take one tablet by mouth on an empty stomach 1 hour prior to intercourse, may titrate up as needed 100mg  max       Allergies: No Known Allergies  Family History: Family History  Problem Relation Age of Onset  . Heart attack Mother   . Stroke Father   . Asthma Father   . Cancer Brother   . Diabetes Brother   . Prostate cancer Neg Hx   . Kidney cancer Neg Hx   . Bladder Cancer Neg Hx      Social History:  reports that he has been smoking cigars. He has never used smokeless tobacco. He reports current alcohol use. He reports that he does not use drugs.  ROS: UROLOGY Frequent Urination?: No Hard to postpone urination?: No Burning/pain with urination?: No Get up at night to urinate?: No Leakage of urine?: Yes Urine stream starts and stops?: No Trouble starting stream?: No Do you have to strain to urinate?: No Blood in urine?: No Urinary tract infection?: No Sexually transmitted disease?: No Injury to kidneys or bladder?: No Painful intercourse?: No Weak stream?: No Erection problems?: No Penile pain?: No  Gastrointestinal Nausea?: No Vomiting?: No Indigestion/heartburn?: No Diarrhea?: No Constipation?: No  Constitutional Fever: No Night sweats?: No Weight loss?: No Fatigue?: No  Skin Skin rash/lesions?: No Itching?: No  Eyes Blurred vision?: No Double vision?: No  Ears/Nose/Throat Sore throat?: No Sinus problems?: No  Hematologic/Lymphatic Swollen glands?: No Easy bruising?: No  Cardiovascular Leg swelling?: No Chest pain?: No  Respiratory Cough?: No Shortness of breath?: No  Endocrine Excessive thirst?: No  Musculoskeletal Back pain?: No Joint pain?: No  Neurological Headaches?: No Dizziness?: No  Psychologic Depression?: No Anxiety?: No  Physical Exam: BP (!) 166/92   Pulse 63   Ht 6\' 1"  (1.854 m)   Wt 215 lb (97.5 kg)   BMI 28.37 kg/m   Constitutional:  Alert and oriented, No acute distress. HEENT: [place] AT, moist mucus membranes.  Trachea midline, no masses. Cardiovascular: No clubbing, cyanosis, or edema. Respiratory: Normal respiratory effort, no increased work of breathing. Skin: No rashes, bruises or suspicious lesions. Neurologic: Grossly intact, no focal deficits, moving all 4 extremities. Psychiatric: Normal mood and affect.  Laboratory Data: Lab Results  Component Value Date   WBC 4.0 10/16/2018    HGB 12.1 (L) 10/16/2018   HCT 37.1 (L) 10/16/2018   MCV 85.9 10/16/2018   PLT 201 10/16/2018    Lab Results  Component Value Date   CREATININE 1.07 10/18/2018    Component     Latest Ref Rng & Units 07/02/2016 09/04/2017 01/05/2018 01/29/2018  Prostate Specific Ag, Serum     0.0 - 4.0 ng/mL 3.8 4.3 (H) 6.1 (H) 5.0 (H)   Component     Latest Ref Rng & Units 05/03/2018 11/08/2018 02/04/2019  Prostate Specific Ag, Serum     0.0 - 4.0 ng/mL 7.4 (H) 0.4 <0.1    Imaging: CLINICAL DATA:  Prostate carcinoma with biochemical recurrence. High risk prostate carcinoma. Prostatectomy 10/01/2018.  EXAM: NUCLEAR MEDICINE PET SKULL BASE TO THIGH  TECHNIQUE: 10.8 mCi F-18 Fluciclovine was injected intravenously. Full-ring PET imaging was performed from the skull base to thigh after the radiotracer. CT data was obtained and used for attenuation correction and anatomic localization.  COMPARISON:  CT 10/15/2018  FINDINGS: NECK  No radiotracer activity in neck lymph nodes.  Incidental CT finding: None  CHEST  No radiotracer accumulation within mediastinal or hilar lymph nodes. No suspicious pulmonary nodules on the CT scan.  Incidental CT finding: None  ABDOMEN/PELVIS  Prostate: No focal activity in the prostate bed.  Lymph nodes: No abnormal radiotracer accumulation within pelvic or abdominal nodes.  Liver: No evidence of liver metastasis large benign hepatic cysts noted.  Incidental CT finding: 2 low-density collections in the anterior LEFT pelvis each measuring approximately 5 cm. 1 adjacent to the LEFT operator space and 1 anterior the bladder. Collections most consistent with postoperative seromas are lymphoceles. No radiotracer activity  SKELETON  No focal  activity to suggest skeletal metastasis.  IMPRESSION: 1. No evidence of prostate carcinoma nodal metastasis in the pelvis. 2. No evidence distant nodal disease or soft tissue metastasis. 3. No  skeletal metastasis. 4. Two low-density fluid collections in the pelvis consistent with postoperative seromas or lymphoceles.   Electronically Signed   By: Suzy Bouchard M.D.   On: 12/09/2018 15:14  Imaging reviewed again today, agee with interpretation  Assessment & Plan:    1. Prostate cancer ([place]) pT3a N1 Gleason 3+5 prostate cancer s/p radical prostatectomy with bilateral pelvic lymph node dissection on 10/2018 Currently undetectable PSA on ADT with his well tolerated Reviewed bone health recs Discussed the option of whole pelvic radiation along with ADT with the intent of possible discontinuation of ADT down the road if his PSA remains undetectable He will consider this but would like to continue to hold off at this time due to his ongoing incontinence which is improving We will readdress this at his next follow-up  2. Stress incontinence (male) (male) Improving Reviewed pelvic floor exercises again today Anticipate continued improvement with time  3. Erectile dysfunction after radical prostatectomy Refractory to PDE 5 inhibitors Patient is here that he may respond to PDE 5 inhibitors on the right especially since he had erections initially, likely blunted secondary to ADT effect We discussed options including intracavernosal injections/intraurethral suppository which he is not interested in pursuing-may be interested in the future Not a good candidate for implant at this time specifically given proximity to prostatectomy and possible radiation in the future   Return in about 1 month (around 03/11/2019) for 1 month nurse with lupron and 7 month MD visit PSA.  Hollice Espy, MD  [facility] [address] [place], [place] [address] [phone]

## 2019-02-17 ENCOUNTER — Other Ambulatory Visit: Payer: Self-pay | Admitting: Urology

## 2019-03-09 ENCOUNTER — Other Ambulatory Visit: Payer: Self-pay

## 2019-03-09 ENCOUNTER — Ambulatory Visit (INDEPENDENT_AMBULATORY_CARE_PROVIDER_SITE_OTHER): Payer: 59 | Admitting: *Deleted

## 2019-03-09 ENCOUNTER — Encounter: Payer: Self-pay | Admitting: Physician Assistant

## 2019-03-09 VITALS — BP 170/101 | HR 57 | Ht 73.0 in

## 2019-03-09 DIAGNOSIS — C61 Malignant neoplasm of prostate: Secondary | ICD-10-CM

## 2019-03-09 MED ORDER — LEUPROLIDE ACETATE (6 MONTH) 45 MG ~~LOC~~ KIT
45.0000 mg | PACK | Freq: Once | SUBCUTANEOUS | Status: AC
  Administered 2019-03-09: 45 mg via SUBCUTANEOUS

## 2019-03-09 NOTE — Progress Notes (Signed)
Eligard SubQ Injection   Due to Prostate Cancer patient is present today for a Eligard Injection.  Medication: Eligard 6 month Dose: 45 mg  Location: left lower loin Lot: [id number] Exp: 10/23/2020  Patient tolerated well, no complications were noted  Performed by: Verlene Mayer, CMA  Follow up: 6 months

## 2019-03-10 ENCOUNTER — Ambulatory Visit: Payer: 59

## 2019-04-14 ENCOUNTER — Encounter: Payer: Self-pay | Admitting: *Deleted

## 2019-06-09 ENCOUNTER — Other Ambulatory Visit: Payer: Self-pay | Admitting: Family Medicine

## 2019-06-09 NOTE — Telephone Encounter (Signed)
Requested medication (s) are due for refill today: yes  Requested medication (s) are on the active medication list:yes  Last refill:  03/08/2019  Future visit scheduled:no  Notes to clinic:  no valid encounter within last 6 months   Requested Prescriptions  Pending Prescriptions Disp Refills   diltiazem (CARDIZEM CD) 240 MG 24 hr capsule [Pharmacy Med Name: DILTIAZEM 24H ER(CD) 240 MG CP] 90 capsule 3    Sig: TAKE 1 CAPSULE BY MOUTH EVERY DAY      Cardiovascular:  Calcium Channel Blockers Failed - 06/09/2019  1:55 AM      Failed - Last BP in normal range    BP Readings from Last 1 Encounters:  03/09/19 (!) 170/101          Failed - Valid encounter within last 6 months    Recent Outpatient Visits           6 months ago Need for pneumococcal vaccination   [facility] [name], MD   1 year ago Subacute maxillary sinusitis   [place], [place]   1 year ago Gastritis without bleeding, unspecified chronicity, unspecified gastritis type   [facility] [name], MD   1 year ago Right upper quadrant abdominal pain   [facility] [name], MD   1 year ago Pre-op examination   [facility] [place], [place]       Future Appointments             In 3 months Hollice Espy, MD [facility]

## 2019-06-13 ENCOUNTER — Encounter: Payer: Self-pay | Admitting: Physician Assistant

## 2019-06-13 ENCOUNTER — Ambulatory Visit (INDEPENDENT_AMBULATORY_CARE_PROVIDER_SITE_OTHER): Payer: 59 | Admitting: Physician Assistant

## 2019-06-13 ENCOUNTER — Other Ambulatory Visit: Payer: Self-pay

## 2019-06-13 VITALS — BP 155/98 | HR 56 | Temp 97.5°F | Wt 227.0 lb

## 2019-06-13 DIAGNOSIS — H00021 Hordeolum internum right upper eyelid: Secondary | ICD-10-CM

## 2019-06-13 MED ORDER — ERYTHROMYCIN 5 MG/GM OP OINT: 1.0000 "application " | TOPICAL_OINTMENT | Freq: Every day | OPHTHALMIC | 0 refills | Status: DC

## 2019-06-13 NOTE — Progress Notes (Signed)
Patient: Jack Weaver Male    DOB: 1955-10-15   64 y.o.   MRN: [id number] Visit Date: 06/13/2019  Today's Provider: Mar Daring, PA-C   Chief Complaint  Patient presents with  . Eye Problem   Subjective:     Eye Problem  The right eye is affected. This is a new problem. The current episode started in the past 7 days (started last Weds (06/08/19)). The problem occurs constantly. The problem has been gradually worsening. There was no injury mechanism. Associated symptoms include itching and photophobia (chronic). Pertinent negatives include no blurred vision, eye discharge, double vision, eye redness or fever.    No Known Allergies   Current Outpatient Medications:  .  diltiazem (CARDIZEM CD) 240 MG 24 hr capsule, TAKE 1 CAPSULE BY MOUTH EVERY DAY, Disp: 30 capsule, Rfl: 1 .  lisinopril (PRINIVIL,ZESTRIL) 40 MG tablet, Take 1 tablet (40 mg total) by mouth daily., Disp: 90 tablet, Rfl: 3 .  Multiple Vitamin (MULTIVITAMIN WITH MINERALS) TABS tablet, Take 1 tablet by mouth daily., Disp: , Rfl:  .  omeprazole (PRILOSEC) 40 MG capsule, Take 1 capsule (40 mg total) by mouth daily as needed (acid reflux/indigestion)., Disp: 90 capsule, Rfl: 4 .  sildenafil (REVATIO) 20 MG tablet, Take one tablet by mouth on an empty stomach 1 hour prior to intercourse, may titrate up as needed 100mg  max (Patient not taking: Reported on 02/09/2019), Disp: 30 tablet, Rfl: 3  Review of Systems  Constitutional: Negative.  Negative for fever.  HENT: Negative.   Eyes: Positive for photophobia (chronic) and itching. Negative for blurred vision, double vision, pain, discharge, redness and visual disturbance.  Neurological: Negative.     Social History   Tobacco Use  . Smoking status: Current Some Day Smoker    Types: Cigars  . Smokeless tobacco: Never Used  . Tobacco comment: quit in 06/2013. Pt recently started 1 cigarette daily. Now smokes once a week.  Substance Use Topics  . Alcohol use:  Yes    Alcohol/week: 0.0 standard drinks    Comment: Wine 5x week      Objective:   BP (!) 155/98 (BP Location: Left Arm, Patient Position: Sitting, Cuff Size: Large)   Pulse (!) 56   Temp (!) 97.5 F (36.4 C) (Temporal)   Wt 227 lb (103 kg)   BMI 29.95 kg/m  Vitals:   06/13/19 1621  BP: (!) 155/98  Pulse: (!) 56  Temp: (!) 97.5 F (36.4 C)  TempSrc: Temporal  Weight: 227 lb (103 kg)  Body mass index is 29.95 kg/m.   Physical Exam Vitals reviewed.  Constitutional:      General: He is not in acute distress.    Appearance: Normal appearance. He is well-developed. He is not ill-appearing.  HENT:     Head: Normocephalic and atraumatic.  Eyes:     General: No scleral icterus.       Right eye: Hordeolum present. No discharge.        Left eye: No discharge.     Extraocular Movements: Extraocular movements intact.     Conjunctiva/sclera: Conjunctivae normal.     Pupils: Pupils are equal, round, and reactive to light.  Pulmonary:     Effort: Pulmonary effort is normal. No respiratory distress.  Musculoskeletal:     Cervical back: Normal range of motion and neck supple.  Neurological:     Mental Status: He is alert.  Psychiatric:        Mood  and Affect: Mood normal.        Behavior: Behavior normal.        Thought Content: Thought content normal.        Judgment: Judgment normal.      No results found for any visits on 06/13/19.     Assessment & Plan    1. Hordeolum internum of right upper eyelid Worsening. Will treat with erythromycin ointment as below. Continue conservative management with warm compresses and wash with gentle shampoo. Call if not improving.  - erythromycin ophthalmic ointment; Place 1 application into the right eye at bedtime.  Dispense: 3.5 g; Refill: 0     Mar Daring, PA-C  [facility]

## 2019-06-13 NOTE — Patient Instructions (Signed)

## 2019-07-01 ENCOUNTER — Other Ambulatory Visit: Payer: 59

## 2019-07-05 ENCOUNTER — Other Ambulatory Visit: Payer: Self-pay | Admitting: Family Medicine

## 2019-07-05 NOTE — Telephone Encounter (Signed)
Requested Prescriptions  Pending Prescriptions Disp Refills  . diltiazem (CARDIZEM CD) 240 MG 24 hr capsule [Pharmacy Med Name: DILTIAZEM 24H ER(CD) 240 MG CP] 30 capsule 1    Sig: TAKE 1 CAPSULE BY MOUTH EVERY DAY     Cardiovascular:  Calcium Channel Blockers Failed - 07/05/2019 10:33 AM      Failed - Last BP in normal range    BP Readings from Last 1 Encounters:  06/13/19 (!) 155/98         Passed - Valid encounter within last 6 months    Recent Outpatient Visits          3 weeks ago Hordeolum internum of right upper eyelid   [place], [place]   7 months ago Need for pneumococcal vaccination   [facility] [name], MD   1 year ago Subacute maxillary sinusitis   [facility] [place], [place], [place]   1 year ago Gastritis without bleeding, unspecified chronicity, unspecified gastritis type   [facility] [name], MD   1 year ago Right upper quadrant abdominal pain   [facility] [name], MD      Future Appointments            In 2 months Hollice Espy, MD [facility]

## 2019-07-07 ENCOUNTER — Ambulatory Visit: Payer: 59 | Attending: Internal Medicine

## 2019-07-07 DIAGNOSIS — Z23 Encounter for immunization: Secondary | ICD-10-CM | POA: Insufficient documentation

## 2019-07-07 NOTE — Progress Notes (Signed)
   Covid-19 Vaccination Clinic  Name:  Jack Weaver    MRN: [id number] DOB: Jan 12, 1956  07/07/2019  [name] was observed post Covid-19 immunization for 15 minutes without incidence. He was provided with Vaccine Information Sheet and instruction to access the V-Safe system.   [name] was instructed to call 911 with any severe reactions post vaccine: Marland Kitchen Difficulty breathing  . Swelling of your face and throat  . A fast heartbeat  . A bad rash all over your body  . Dizziness and weakness    Immunizations Administered    Name Date Dose VIS Date Route   Pfizer COVID-19 Vaccine 07/07/2019 10:40 AM 0.3 mL 05/06/2019 Intramuscular   Manufacturer: [place]   Lot: [id number]   [place]: [id number]

## 2019-07-29 ENCOUNTER — Other Ambulatory Visit: Payer: Self-pay | Admitting: Family Medicine

## 2019-08-02 ENCOUNTER — Emergency Department
Admission: EM | Admit: 2019-08-02 | Discharge: 2019-08-02 | Disposition: A | Discharge status: Home / Self Care | Payer: 59 | Attending: Emergency Medicine | Admitting: Emergency Medicine

## 2019-08-02 ENCOUNTER — Encounter: Payer: Self-pay | Admitting: Emergency Medicine

## 2019-08-02 ENCOUNTER — Emergency Department: Payer: 59

## 2019-08-02 ENCOUNTER — Other Ambulatory Visit: Payer: Self-pay

## 2019-08-02 DIAGNOSIS — F17290 Nicotine dependence, other tobacco product, uncomplicated: Secondary | ICD-10-CM | POA: Diagnosis not present

## 2019-08-02 DIAGNOSIS — M4722 Other spondylosis with radiculopathy, cervical region: Secondary | ICD-10-CM | POA: Insufficient documentation

## 2019-08-02 DIAGNOSIS — Z8546 Personal history of malignant neoplasm of prostate: Secondary | ICD-10-CM | POA: Diagnosis not present

## 2019-08-02 DIAGNOSIS — R202 Paresthesia of skin: Secondary | ICD-10-CM | POA: Insufficient documentation

## 2019-08-02 DIAGNOSIS — R200 Anesthesia of skin: Secondary | ICD-10-CM | POA: Diagnosis present

## 2019-08-02 LAB — CBC WITH DIFFERENTIAL/PLATELET
Abs Immature Granulocytes: 0.01 10*3/uL (ref 0.00–0.07)
Basophils Absolute: 0 10*3/uL (ref 0.0–0.1)
Basophils Relative: 1 %
Eosinophils Absolute: 0.1 10*3/uL (ref 0.0–0.5)
Eosinophils Relative: 2 %
HCT: 44.4 % (ref 39.0–52.0)
Hemoglobin: 14.7 g/dL (ref 13.0–17.0)
Immature Granulocytes: 0 %
Lymphocytes Relative: 44 %
Lymphs Abs: 1.4 10*3/uL (ref 0.7–4.0)
MCH: 27.9 pg (ref 26.0–34.0)
MCHC: 33.1 g/dL (ref 30.0–36.0)
MCV: 84.3 fL (ref 80.0–100.0)
Monocytes Absolute: 0.3 10*3/uL (ref 0.1–1.0)
Monocytes Relative: 9 %
Neutro Abs: 1.4 10*3/uL — ABNORMAL LOW (ref 1.7–7.7)
Neutrophils Relative %: 44 %
Platelets: 201 10*3/uL (ref 150–400)
RBC: 5.27 MIL/uL (ref 4.22–5.81)
RDW: 12.9 % (ref 11.5–15.5)
WBC: 3.2 10*3/uL — ABNORMAL LOW (ref 4.0–10.5)
nRBC: 0 % (ref 0.0–0.2)

## 2019-08-02 LAB — COMPREHENSIVE METABOLIC PANEL
ALT: 21 U/L (ref 0–44)
AST: 25 U/L (ref 15–41)
Albumin: 4.2 g/dL (ref 3.5–5.0)
Alkaline Phosphatase: 113 U/L (ref 38–126)
Anion gap: 10 (ref 5–15)
BUN: 16 mg/dL (ref 8–23)
CO2: 24 mmol/L (ref 22–32)
Calcium: 8.9 mg/dL (ref 8.9–10.3)
Chloride: 106 mmol/L (ref 98–111)
Creatinine, Ser: 0.96 mg/dL (ref 0.61–1.24)
GFR calc Af Amer: >60 mL/min (ref >60)
GFR calc non Af Amer: >60 mL/min (ref >60)
Glucose, Bld: 165 mg/dL — ABNORMAL HIGH (ref 70–99)
Potassium: 3.5 mmol/L (ref 3.5–5.1)
Sodium: 140 mmol/L (ref 135–145)
Total Bilirubin: 0.7 mg/dL (ref 0.3–1.2)
Total Protein: 7.1 g/dL (ref 6.5–8.1)

## 2019-08-02 MED ORDER — PREDNISONE 10 MG (21) PO TBPK: ORAL_TABLET | ORAL | 0 refills | Status: DC

## 2019-08-02 MED ORDER — FLUTICASONE PROPIONATE 50 MCG/ACT NA SUSP: 1.0000 | Freq: Every day | NASAL | 2 refills | Status: DC

## 2019-08-02 NOTE — ED Provider Notes (Signed)
[facility] Emergency Department Provider Note       Time seen: ----------------------------------------- 7:48 AM on 08/02/2019 -----------------------------------------   I have reviewed the triage vital signs and the nursing notes.  HISTORY   Chief Complaint Numbness    HPI Jack Weaver is a 64 y.o. male with a history of arthritis, cancer, hypertension who presents to the ED for numbness and tingling to the entire right side of the body last night that started approximately at 1030.  Patient states symptoms have not resolved at this time.  He reports symptoms are worse with turning his head to the right.  He denies any pain.  Past Medical History:  Diagnosis Date  . Arthritis   . Cancer ([place])   . Hypertension     Patient Active Problem List   Diagnosis Date Noted  . Postoperative ileus ([place]) 10/15/2018  . Right lower quadrant abdominal pain   . Prostate cancer ([place]) 10/11/2018  . Lumbar sprain 09/04/2017  . Traumatic rupture of quadriceps tendon 09/02/2017  . Bursitis of hip 05/31/2015  . ED (erectile dysfunction) of organic origin 11/18/2014  . BP (high blood pressure) 11/18/2014  . Neuropathy 11/18/2014  . Allergic rhinitis 11/14/2014  . Arthropathia 11/26/2009  . History of tobacco use 07/31/2008  . Acid reflux 07/26/2008  . Arthritis, degenerative 07/26/2008  . Apnea, sleep 06/30/2008    Past Surgical History:  Procedure Laterality Date  . APPENDECTOMY     At age 49  . HEMORROIDECTOMY  1930  . PELVIC LYMPH NODE DISSECTION Bilateral 10/11/2018   Procedure: PELVIC LYMPH NODE DISSECTION;  Surgeon: Hollice Espy, MD;  Location: [facility] ORS;  Service: Urology;  Laterality: Bilateral;  . QUADRICEPS TENDON REPAIR Left 09/09/2017   Procedure: REPAIR QUADRICEP TENDON;  Surgeon: Earnestine Leys, MD;  Location: [facility] ORS;  Service: Orthopedics;  Laterality: Left;  . ROBOT ASSISTED LAPAROSCOPIC RADICAL PROSTATECTOMY N/A 10/11/2018   Procedure:  ROBOTIC ASSISTED LAPAROSCOPIC RADICAL PROSTATECTOMY;  Surgeon: Hollice Espy, MD;  Location: [facility] ORS;  Service: Urology;  Laterality: N/A;    Allergies Patient has no known allergies.  Social History Social History   Tobacco Use  . Smoking status: Current Some Day Smoker    Types: Cigars  . Smokeless tobacco: Never Used  Substance Use Topics  . Alcohol use: Yes    Alcohol/week: 5.0 standard drinks    Types: 5 Glasses of wine per week  . Drug use: No    Review of Systems Constitutional: Negative for fever. Cardiovascular: Negative for chest pain. Respiratory: Negative for shortness of breath. Gastrointestinal: Negative for abdominal pain, vomiting and diarrhea. Musculoskeletal: Negative for back pain. Skin: Negative for rash. Neurological: Negative for headaches, positive for numbness  All systems negative/normal/unremarkable except as stated in the HPI  ____________________________________________   PHYSICAL EXAM:  VITAL SIGNS: ED Triage Vitals  Enc Vitals Group     BP 08/02/19 0743 (!) 161/76     Pulse Rate 08/02/19 0743 60     Resp 08/02/19 0743 16     Temp 08/02/19 0743 98 F (36.7 C)     Temp Source 08/02/19 0743 Oral     SpO2 08/02/19 0743 98 %     Weight 08/02/19 0744 219 lb (99.3 kg)     Height 08/02/19 0744 5\' 11"  (1.803 m)     Head Circumference --      Peak Flow --      Pain Score 08/02/19 0744 0     Pain Loc --  Pain Edu? --      Excl. in [place]? --     Constitutional: Alert and oriented. Well appearing and in no distress. Eyes: Conjunctivae are normal. Normal extraocular movements. ENT      Head: Normocephalic and atraumatic.      Nose: No congestion/rhinnorhea.      Mouth/Throat: Mucous membranes are moist.      Neck: No stridor. Cardiovascular: Normal rate, regular rhythm. No murmurs, rubs, or gallops. Respiratory: Normal respiratory effort without tachypnea nor retractions. Breath sounds are clear and equal bilaterally. No  wheezes/rales/rhonchi. Gastrointestinal: Soft and nontender. Normal bowel sounds Musculoskeletal: Nontender with normal range of motion in extremities. No lower extremity tenderness nor edema. Neurologic:  Normal speech and language.  Paresthesias in the right arm and hand are noted, no other focal deficits, normal cranial nerves, normal strength Skin:  Skin is warm, dry and intact. No rash noted. Psychiatric: Mood and affect are normal. Speech and behavior are normal.  ____________________________________________  EKG: Interpreted by me.  Sinus rhythm with a rate of 59 bpm, borderline repolarization abnormality, normal QT  ____________________________________________  ED COURSE:  As part of my medical decision making, I reviewed the following data within the [place] History obtained from family if available, nursing notes, old chart and ekg, as well as notes from prior ED visits. Patient presented for numbness, we will assess with labs and imaging as indicated at this time.   Procedures  Jack Weaver was evaluated in Emergency Department on 08/02/2019 for the symptoms described in the history of present illness. He was evaluated in the context of the global COVID-19 pandemic, which necessitated consideration that the patient might be at risk for infection with the SARS-CoV-2 virus that causes COVID-19. Institutional protocols and algorithms that pertain to the evaluation of patients at risk for COVID-19 are in a state of rapid change based on information released by regulatory bodies including the CDC and federal and state organizations. These policies and algorithms were followed during the patient's care in the ED.  ____________________________________________   LABS (pertinent positives/negatives)  Labs Reviewed  CBC WITH DIFFERENTIAL/PLATELET - Abnormal; Notable for the following components:      Result Value   WBC 3.2 (*)    Neutro Abs 1.4 (*)    All other  components within normal limits  COMPREHENSIVE METABOLIC PANEL - Abnormal; Notable for the following components:   Glucose, Bld 165 (*)    All other components within normal limits    RADIOLOGY Images were viewed by me  CT head  IMPRESSION:  1. No acute finding including infarct.  2. Mild white matter disease likely related to history of  hypertension.  3. Chronic left maxillary sinusitis with sinus obstruction and  inspissated contents.  IMPRESSION:  1. No acute finding or evidence of metastatic disease.  2. Degenerative foraminal impingement at C5-6, C6-7, and on the left  at C7-T1.  3. Diffusely patent spinal canal.   IMPRESSION:  Foci of paranasal sinus disease, primarily in the upper left  maxillary antrum. Study otherwise unremarkable. Note that brain  parenchyma appears unremarkable.  ____________________________________________   DIFFERENTIAL DIAGNOSIS   Paresthesia, CVA, TIA, cervical radiculopathy, degenerative disc disease  FINAL ASSESSMENT AND PLAN  Paresthesia, cervical degenerative disc disease, chronic sinusitis   Plan: The patient had presented for right-sided paresthesias. Patient's labs were unremarkable. Patient's imaging did reveal some degenerative foraminal impingement particularly C5-C7, he also has chronic left maxillary sinusitis.  He will be discharged with  steroids and I will encourage him to use Flonase nasal spray.  I will refer him to neurosurgery for follow-up for his spine on steroids and ENT for his sinuses.   Laurence Aly, MD    Note: This note was generated in part or whole with voice recognition software. Voice recognition is usually quite accurate but there are transcription errors that can and very often do occur. I apologize for any typographical errors that were not detected and corrected.     Earleen Newport, MD 08/02/19 [phone]

## 2019-08-02 NOTE — ED Triage Notes (Signed)
Pt in via POV, reports numbness/tingling to entire right side of body occurring last night approximately 2230, symptoms have not resolved at this time.  Pt reports symptoms are worse with turning his head to the right.  No other deficits noted at this time.  Ambulatory to triage, NAD noted.

## 2019-08-02 NOTE — ED Notes (Signed)
Pt transported to MRI by tech.

## 2019-08-03 ENCOUNTER — Ambulatory Visit: Payer: Managed Care, Other (non HMO) | Attending: Internal Medicine

## 2019-08-03 DIAGNOSIS — Z23 Encounter for immunization: Secondary | ICD-10-CM | POA: Insufficient documentation

## 2019-08-03 NOTE — Progress Notes (Signed)
   Covid-19 Vaccination Clinic  Name:  JENCARLOS PLY    MRN: [id number] DOB: Oct 22, 1955  08/03/2019  Mr. Damm was observed post Covid-19 immunization for 15 minutes without incident. He was provided with Vaccine Information Sheet and instruction to access the V-Safe system.   Mr. Lorek was instructed to call 911 with any severe reactions post vaccine: Marland Kitchen Difficulty breathing  . Swelling of face and throat  . A fast heartbeat  . A bad rash all over body  . Dizziness and weakness   Immunizations Administered    Name Date Dose VIS Date Route   Pfizer COVID-19 Vaccine 08/03/2019 12:26 PM 0.3 mL 05/06/2019 Intramuscular   Manufacturer: [place]   Lot: [id number]   [place]: [id number]

## 2019-08-28 ENCOUNTER — Other Ambulatory Visit: Payer: Self-pay | Admitting: Urology

## 2019-08-28 DIAGNOSIS — N5231 Erectile dysfunction following radical prostatectomy: Secondary | ICD-10-CM

## 2019-09-12 ENCOUNTER — Other Ambulatory Visit: Payer: Self-pay

## 2019-09-12 DIAGNOSIS — R972 Elevated prostate specific antigen [PSA]: Secondary | ICD-10-CM

## 2019-09-12 NOTE — Addendum Note (Signed)
Addended by: Alvera Novel on: 09/12/2019 01:17 PM   Modules accepted: Orders

## 2019-09-13 ENCOUNTER — Other Ambulatory Visit: Payer: Self-pay

## 2019-09-13 ENCOUNTER — Other Ambulatory Visit: Payer: 59

## 2019-09-13 DIAGNOSIS — R972 Elevated prostate specific antigen [PSA]: Secondary | ICD-10-CM

## 2019-09-14 LAB — PSA: Prostate Specific Ag, Serum: 0.7 ng/mL (ref 0.0–4.0)

## 2019-09-15 ENCOUNTER — Ambulatory Visit: Payer: 59 | Admitting: Urology

## 2019-09-19 NOTE — Progress Notes (Signed)
09/20/19 4:00 PM   Jack Weaver 07-Nov-1955 [id number]  Referring provider: [name], MD [address] [place],  [place] [address] Chief Complaint  Patient presents with  . Prostate Cancer    HPI: Jack Weaver is a 64 y.o. M with a personal history of high risk prostate cancer urgency for routine follow-up.  He underwent radical prostatectomy non-nerve sparing with bilateral pelvic lymph node dissection on 10/01/2018.  Surgical pathology was consistent with Gleason 3+5 disease, pT3a N1.He was noted to have extraprostatic extension with positive posterior margins both left and right. He is also noted to have 2 of 3 lymph nodes on the left with focal metastatic disease. 0 of 3 lymph nodes on the right. No bladder neck involvement. No seminal vesicle involvement.  Postoperative PSA from 11/08/2018 0.4.  Staging with [employer] PET scan was negative for metastatic disease.  He was subsequently started on Lupron.  Decline who pelvic radiation.  His last injection of Eligard 6 month 45 mg was on 03/09/2019.   He has been tolerating this very well. He is taking calcium and vitamin D. PSA on 09/13/19 was 0.7.   His leakage has improved w/ no recent use of pads however does have some mild incontinence upon extreme physical exertion. He has not had any recent episodes of hot flashes. He does report of weight gain.   PMH: Past Medical History:  Diagnosis Date  . Arthritis   . Cancer ([place])   . Hypertension     Surgical History: Past Surgical History:  Procedure Laterality Date  . APPENDECTOMY     At age 29  . HEMORROIDECTOMY  1930  . PELVIC LYMPH NODE DISSECTION Bilateral 10/11/2018   Procedure: PELVIC LYMPH NODE DISSECTION;  Surgeon: Hollice Espy, MD;  Location: [facility] ORS;  Service: Urology;  Laterality: Bilateral;  . QUADRICEPS TENDON REPAIR Left 09/09/2017   Procedure: REPAIR QUADRICEP TENDON;  Surgeon: Earnestine Leys, MD;  Location: [facility] ORS;  Service:  Orthopedics;  Laterality: Left;  . ROBOT ASSISTED LAPAROSCOPIC RADICAL PROSTATECTOMY N/A 10/11/2018   Procedure: ROBOTIC ASSISTED LAPAROSCOPIC RADICAL PROSTATECTOMY;  Surgeon: Hollice Espy, MD;  Location: [facility] ORS;  Service: Urology;  Laterality: N/A;    Home Medications:  Allergies as of 09/20/2019   No Known Allergies     Medication List       Accurate as of September 20, 2019 11:59 PM. If you have any questions, ask your nurse or doctor.        STOP taking these medications   predniSONE 10 MG (21) Tbpk tablet Commonly known as: STERAPRED UNI-PAK 21 TAB Stopped by: Hollice Espy, MD     TAKE these medications   diltiazem 240 MG 24 hr capsule Commonly known as: CARDIZEM CD TAKE 1 CAPSULE BY MOUTH EVERY DAY   fluticasone 50 MCG/ACT nasal spray Commonly known as: Flonase Place 1 spray into both nostrils daily.   ibuprofen 200 MG tablet Commonly known as: ADVIL Take 400 mg by mouth every 8 (eight) hours as needed for mild pain.   lisinopril 40 MG tablet Commonly known as: ZESTRIL Take 1 tablet (40 mg total) by mouth daily.   multivitamin with minerals Tabs tablet Take 1 tablet by mouth daily.   omeprazole 40 MG capsule Commonly known as: PRILOSEC Take 1 capsule (40 mg total) by mouth daily as needed (acid reflux/indigestion).   sildenafil 20 MG tablet Commonly known as: REVATIO TAKE ONE TABLET BY MOUTH ON AN EMPTY STOMACH 1 HOUR PRIOR  TO INTERCOURSE, MAY TITRATE UP AS NEEDED 100MG  MAX       Allergies: No Known Allergies  Family History: Family History  Problem Relation Age of Onset  . Heart attack Mother   . Stroke Father   . Asthma Father   . Cancer Brother   . Diabetes Brother   . Prostate cancer Neg Hx   . Kidney cancer Neg Hx   . Bladder Cancer Neg Hx     Social History:  reports that he has been smoking cigars. He has never used smokeless tobacco. He reports current alcohol use of about 5.0 standard drinks of alcohol per week. He reports that he  does not use drugs.   Physical Exam: BP (!) 185/97   Pulse (!) 57   Ht 5\' 11"  (1.803 m)   Wt 215 lb (97.5 kg)   BMI 29.99 kg/m   Constitutional:  Alert and oriented, No acute distress. HEENT: [place] AT, moist mucus membranes.  Trachea midline, no masses. Cardiovascular: No clubbing, cyanosis, or edema. Respiratory: Normal respiratory effort, no increased work of breathing. Skin: No rashes, bruises or suspicious lesions. Neurologic: Grossly intact, no focal deficits, moving all 4 extremities. Psychiatric: Normal mood and affect.  Laboratory Data:  Lab Results  Component Value Date   CREATININE 0.96 08/02/2019   Assessment & Plan:    1. Prostate Cancer Most recent PSA 0.7 on Eligard concerning for possible development of castrate resistance Eligard 6 month depot today  Repeat blood work today including PSA and testosterone  Implications of this finding were discussed in detail Will consider medical oncology referral if a castrate levels Discussed importance of bone health on ADT, recommend 1000-1200 mg daily calcium suppliment and [id number] IU vit D daily.  Also encouraged weight being exercises and cardiovascular health. Will discuss case at Tumor board for further evaluation if needed based on bloodwork  Will call pt back w/ blood work results for plan   2. Stress incontinence Minimal Continue exercises  [facility] [address], [place], [place] [address] [phone]  I, Jack Weaver, am acting as a scribe for Dr. Hollice Espy,  I have reviewed the above documentation for accuracy and completeness, and I agree with the above.   Hollice Espy, MD  I spent 30 total minutes on the day of the encounter including pre-visit review of the medical record, face-to-face time with the patient, and post visit ordering of labs/imaging/tests.

## 2019-09-20 ENCOUNTER — Ambulatory Visit (INDEPENDENT_AMBULATORY_CARE_PROVIDER_SITE_OTHER): Payer: 59 | Admitting: Urology

## 2019-09-20 ENCOUNTER — Other Ambulatory Visit: Payer: Self-pay

## 2019-09-20 VITALS — BP 185/97 | HR 57 | Ht 71.0 in | Wt 215.0 lb

## 2019-09-20 DIAGNOSIS — C61 Malignant neoplasm of prostate: Secondary | ICD-10-CM

## 2019-09-20 DIAGNOSIS — N393 Stress incontinence (female) (male): Secondary | ICD-10-CM | POA: Diagnosis not present

## 2019-09-20 MED ORDER — LEUPROLIDE ACETATE (6 MONTH) 45 MG ~~LOC~~ KIT
45.0000 mg | PACK | Freq: Once | SUBCUTANEOUS | Status: AC
  Administered 2019-09-20: 45 mg via SUBCUTANEOUS

## 2019-09-20 NOTE — Progress Notes (Signed)
Eligard SubQ Injection   Due to Prostate Cancer patient is present today for a Eligard Injection.  Medication: Eligard 6  month Dose: 45 mg  Location: right Buttocks Lot: [id number]  Exp: [id number]   Patient tolerated well, no complications were noted  Performed by: Alvera Novel  CMA   As Per Dr. [name], will call patient to discuss further treatments.

## 2019-09-21 ENCOUNTER — Telehealth: Payer: Self-pay | Admitting: Urology

## 2019-09-21 LAB — PSA: Prostate Specific Ag, Serum: 0.7 ng/mL (ref 0.0–4.0)

## 2019-09-21 LAB — TESTOSTERONE: Testosterone: 110 ng/dL — ABNORMAL LOW (ref 264–916)

## 2019-09-21 NOTE — Telephone Encounter (Signed)
Scheduled patient lab visit, patient aware. Voiced understanding.

## 2019-09-21 NOTE — Telephone Encounter (Signed)
Called patient to discuss lab results.  Jack Weaver PSA is stable.  His testosterone is 110 which does not meet the definition of castrate.  He did receive another dose of Eligard yesterday.  We will plan to repeat his labs in 1 month, PSA and testosterone.  If his PSA remains at supra-castrate levels, will consider alternative treatment options including total androgen blockade or referral to medical oncology for an opinion.  He is agreeable this plan.  Hollice Espy, MD

## 2019-10-20 ENCOUNTER — Other Ambulatory Visit: Payer: Self-pay

## 2019-10-20 DIAGNOSIS — C61 Malignant neoplasm of prostate: Secondary | ICD-10-CM

## 2019-10-21 ENCOUNTER — Other Ambulatory Visit: Payer: 59

## 2019-10-21 ENCOUNTER — Other Ambulatory Visit: Payer: Self-pay

## 2019-10-21 DIAGNOSIS — C61 Malignant neoplasm of prostate: Secondary | ICD-10-CM

## 2019-10-22 LAB — PSA: Prostate Specific Ag, Serum: 0.2 ng/mL (ref 0.0–4.0)

## 2019-10-22 LAB — TESTOSTERONE: Testosterone: <3 ng/dL — ABNORMAL LOW (ref 264–916)

## 2019-10-26 ENCOUNTER — Telehealth: Payer: Self-pay | Admitting: *Deleted

## 2019-10-26 NOTE — Telephone Encounter (Addendum)
  Spoke with patient, aware and scheduled.   ----- Message from Hollice Espy, MD sent at 10/25/2019  3:06 PM EDT ----- It looks like this testosterone injection worked, the levels are nice and low and your PSA is coming down again.  This is really good news.  This means you are responding to the medication.  I had like to see you when you are due for your next injection which is about 4 months from now.  Please schedule this with a PSA prior.  Hollice Espy, MD

## 2019-11-17 ENCOUNTER — Other Ambulatory Visit: Payer: Self-pay | Admitting: Family Medicine

## 2019-11-17 NOTE — Telephone Encounter (Signed)
Requested  medications are  due for refill today yes  Requested medications are on the active medication list yes  Last refill 08/09/19  Future visit scheduled no  Last visit more than 6 months ago  Notes to clinic Failed protocol due to no visit re:HTN in more than 6 months and no upcoming visit scheduled.

## 2019-11-25 ENCOUNTER — Other Ambulatory Visit: Payer: Self-pay | Admitting: Family Medicine

## 2019-11-25 NOTE — Telephone Encounter (Signed)
Requested  medications are  due for refill today yes  Requested medications are on the active medication list yes  Last refill 6/4  Future visit scheduled no  Notes to clinic Failed protocol due to no pertinent visit within 6 months and no visit scheduled

## 2019-12-11 ENCOUNTER — Other Ambulatory Visit: Payer: Self-pay | Admitting: Family Medicine

## 2019-12-11 NOTE — Telephone Encounter (Signed)
Requested medication (s) are due for refill today: yes  Requested medication (s) are on the active medication list: yes  Last refill:  11/17/19  Future visit scheduled: no  Notes to clinic:  called pt and LM on VM to call office to make an appt for refills- pt was previously given a courtesy RF   Requested Prescriptions  Pending Prescriptions Disp Refills   lisinopril (ZESTRIL) 40 MG tablet [Pharmacy Med Name: LISINOPRIL 40 MG TABLET] 30 tablet 0    Sig: TAKE 1 TABLET BY MOUTH EVERY DAY      Cardiovascular:  ACE Inhibitors Failed - 12/11/2019  2:32 PM      Failed - Last BP in normal range    BP Readings from Last 1 Encounters:  09/20/19 (!) 185/97          Failed - Valid encounter within last 6 months    Recent Outpatient Visits           6 months ago Hordeolum internum of right upper eyelid   [facility] [place], [place], [place]   1 year ago Need for pneumococcal vaccination   [facility] [name], MD   1 year ago Subacute maxillary sinusitis   [place], [place], [place]   1 year ago Gastritis without bleeding, unspecified chronicity, unspecified gastritis type   [facility] [name], MD   1 year ago Right upper quadrant abdominal pain   [facility] [name], MD       Future Appointments             In 2 months Hollice Espy, MD [facility]            Passed - Cr in normal range and within 180 days    Creatinine  Date Value Ref Range Status  12/31/2012 1.11 0.60 - 1.30 mg/dL Final   Creatinine, Ser  Date Value Ref Range Status  08/02/2019 0.96 0.61 - 1.24 mg/dL Final          Passed - K in normal range and within 180 days    Potassium  Date Value Ref Range Status  08/02/2019 3.5 3.5 - 5.1 mmol/L Final  06/22/2011 3.6 3.5 - 5.1 mmol/L Final          Passed - Patient is not pregnant

## 2019-12-12 ENCOUNTER — Telehealth: Payer: Self-pay

## 2019-12-12 NOTE — Telephone Encounter (Signed)
Pt's wife dropped of pt's [place] Screening appeal form. Pt's wife request call @ [phone] when completed. Form placed in Dr. Alben Spittle box. Please advise. Thanks TNP

## 2019-12-14 NOTE — Telephone Encounter (Signed)
Form received

## 2019-12-26 NOTE — Telephone Encounter (Signed)
Form faxed

## 2019-12-26 NOTE — Telephone Encounter (Signed)
Pt's wife advised form has been completed. TNP

## 2020-02-15 ENCOUNTER — Telehealth (INDEPENDENT_AMBULATORY_CARE_PROVIDER_SITE_OTHER): Payer: 59 | Admitting: Family Medicine

## 2020-02-15 ENCOUNTER — Telehealth: Payer: Self-pay

## 2020-02-15 DIAGNOSIS — J069 Acute upper respiratory infection, unspecified: Secondary | ICD-10-CM | POA: Diagnosis not present

## 2020-02-15 NOTE — Telephone Encounter (Unsigned)
Copied from [place] [phone]. Topic: General - Other >> Feb 15, 2020 12:24 PM Celene Kras wrote: Reason for CRM: Pt called stating that he received a call with no VM. Please advise .

## 2020-02-15 NOTE — Progress Notes (Signed)
MyChart Video Visit    Virtual Visit via Video Note   This visit type was conducted due to national recommendations for restrictions regarding the COVID-19 Pandemic (e.g. social distancing) in an effort to limit this patient's exposure and mitigate transmission in our community. This patient is at least at moderate risk for complications without adequate follow up. This format is felt to be most appropriate for this patient at this time. Physical exam was limited by quality of the video and audio technology used for the visit.   Patient location: Car Provider location: Office This was switched to a phone call. I discussed the limitations of evaluation and management by telemedicine and the availability of in person appointments. The patient expressed understanding and agreed to proceed.  Patient: Jack Weaver   DOB: Oct 20, 1955   64 y.o. Male  MRN: [id number] Visit Date: 02/15/2020  Today's healthcare provider: Wilhemena Durie, MD   Chief Complaint  Patient presents with  . sinus drainage   Subjective    Sinus Problem This is a new problem. The current episode started 1 to 4 weeks ago. The problem has been waxing and waning since onset. There has been no fever. Associated symptoms include sinus pressure. (Sinus drainage, loss of taste) Treatments tried: mucinex congestion. The treatment provided no relief.    Patient has had his Covid vaccine.  He has had about 3 days of sinus pressure and sinus drainage.  He lost his taste and smell several years ago.  Saw ENT about it.  No finding.  He has had no fever no chest pain no shortness of breath.  He took Mucinex D which helped his symptoms but caused urinary leakage.     Medications: Outpatient Medications Prior to Visit  Medication Sig  . diltiazem (CARDIZEM CD) 240 MG 24 hr capsule TAKE 1 CAPSULE BY MOUTH EVERY DAY  . fluticasone (FLONASE) 50 MCG/ACT nasal spray Place 1 spray into both nostrils daily.  Marland Kitchen ibuprofen (ADVIL) 200  MG tablet Take 400 mg by mouth every 8 (eight) hours as needed for mild pain.  Marland Kitchen lisinopril (ZESTRIL) 40 MG tablet TAKE 1 TABLET BY MOUTH EVERY DAY  . Multiple Vitamin (MULTIVITAMIN WITH MINERALS) TABS tablet Take 1 tablet by mouth daily.  Marland Kitchen omeprazole (PRILOSEC) 40 MG capsule Take 1 capsule (40 mg total) by mouth daily as needed (acid reflux/indigestion).  . sildenafil (REVATIO) 20 MG tablet TAKE ONE TABLET BY MOUTH ON AN EMPTY STOMACH 1 HOUR PRIOR TO INTERCOURSE, MAY TITRATE UP AS NEEDED 100MG  MAX   No facility-administered medications prior to visit.    Review of Systems  HENT: Positive for sinus pressure.        Objective    There were no vitals taken for this visit.    Physical Exam     Assessment & Plan     1. Viral URI Check for Covid.  I think this is simply a viral URI./Viral sinusitis.  At this time change to regular Mucinex.  Push fluids.  Follow-up if he worsens. - Novel Coronavirus, NAA ([facility])   No follow-ups on file.     I discussed the assessment and treatment plan with the patient. The patient was provided an opportunity to ask questions and all were answered. The patient agreed with the plan and demonstrated an understanding of the instructions.   The patient was advised to call back or seek an in-person evaluation if the symptoms worsen or if the condition fails to improve as  anticipated.  I provided 10 minutes of non-face-to-face time during this encounter.    Richard Cranford Mon, MD [facility] [phone] (phone) [phone] (fax)  [place]

## 2020-02-17 ENCOUNTER — Telehealth: Payer: Self-pay

## 2020-02-17 LAB — SARS-COV-2, NAA 2 DAY TAT

## 2020-02-17 LAB — NOVEL CORONAVIRUS, NAA: SARS-CoV-2, NAA: NOT DETECTED

## 2020-02-17 NOTE — Telephone Encounter (Signed)
-----   Message from [name], MD sent at 02/17/2020  8:11 AM EDT ----- No covid

## 2020-02-17 NOTE — Telephone Encounter (Signed)
Patient advised of covid test result via mychart and has read the providers comments.

## 2020-02-20 ENCOUNTER — Telehealth: Payer: Self-pay

## 2020-02-20 NOTE — Telephone Encounter (Signed)
Forms faxed for PA verification for Eligard/Lupron.

## 2020-02-27 ENCOUNTER — Other Ambulatory Visit: Payer: Self-pay

## 2020-02-27 DIAGNOSIS — C61 Malignant neoplasm of prostate: Secondary | ICD-10-CM

## 2020-02-27 NOTE — Telephone Encounter (Signed)
Incoming benefit verification, NO PA required for Eligard.

## 2020-02-28 ENCOUNTER — Other Ambulatory Visit: Payer: Self-pay

## 2020-02-28 ENCOUNTER — Other Ambulatory Visit: Payer: 59

## 2020-02-28 DIAGNOSIS — C61 Malignant neoplasm of prostate: Secondary | ICD-10-CM

## 2020-02-29 ENCOUNTER — Telehealth: Payer: Self-pay

## 2020-02-29 LAB — PSA: Prostate Specific Ag, Serum: <0.1 ng/mL (ref 0.0–4.0)

## 2020-02-29 NOTE — Telephone Encounter (Signed)
Moved patients Eligard/4 mo follow up scheduled for 03/01/20 to 03/14/20 due to 10/7 being too early for Eligard injection. Patient notified, verbalized understanding.

## 2020-03-01 ENCOUNTER — Telehealth (INDEPENDENT_AMBULATORY_CARE_PROVIDER_SITE_OTHER): Payer: 59 | Admitting: Family Medicine

## 2020-03-01 ENCOUNTER — Ambulatory Visit: Payer: 59 | Admitting: Urology

## 2020-03-01 ENCOUNTER — Encounter: Payer: Self-pay | Admitting: Family Medicine

## 2020-03-01 DIAGNOSIS — J0110 Acute frontal sinusitis, unspecified: Secondary | ICD-10-CM | POA: Diagnosis not present

## 2020-03-01 MED ORDER — AMOXICILLIN 875 MG PO TABS: 875.0000 mg | ORAL_TABLET | Freq: Two times a day (BID) | ORAL | 0 refills | Status: DC

## 2020-03-01 NOTE — Progress Notes (Signed)
MyChart Video Visit    Virtual Visit via Video Note   This visit type was conducted due to national recommendations for restrictions regarding the COVID-19 Pandemic (e.g. social distancing) in an effort to limit this patient's exposure and mitigate transmission in our community. This patient is at least at moderate risk for complications without adequate follow up. This format is felt to be most appropriate for this patient at this time. Physical exam was limited by quality of the video and audio technology used for the visit.   Patient location: Writer location: Office  I discussed the limitations of evaluation and management by telemedicine and the availability of in person appointments. The patient expressed understanding and agreed to proceed.  Patient: Jack Weaver   DOB: 04-Feb-1956   64 y.o. Male  MRN: [id number] Visit Date: 03/01/2020  Today's healthcare provider: Vernie Murders, PA   Chief Complaint  Patient presents with  . URI   Subjective    URI  This is a new problem. Episode onset: 1 month ago. The problem has been unchanged. There has been no fever. Associated symptoms include congestion (chest congestion), coughing (productive with clear sputum), rhinorrhea and sinus pain (over left eye). Pertinent negatives include no headaches, plugged ear sensation, sneezing, sore throat or wheezing. Treatments tried: mucinex. The treatment provided no relief.     Past Medical History:  Diagnosis Date  . Arthritis   . Cancer ([place])   . Hypertension    Past Surgical History:  Procedure Laterality Date  . APPENDECTOMY     At age 46  . HEMORROIDECTOMY  1930  . PELVIC LYMPH NODE DISSECTION Bilateral 10/11/2018   Procedure: PELVIC LYMPH NODE DISSECTION;  Surgeon: Hollice Espy, MD;  Location: [facility] ORS;  Service: Urology;  Laterality: Bilateral;  . QUADRICEPS TENDON REPAIR Left 09/09/2017   Procedure: REPAIR QUADRICEP TENDON;  Surgeon: Earnestine Leys, MD;  Location:  [facility] ORS;  Service: Orthopedics;  Laterality: Left;  . ROBOT ASSISTED LAPAROSCOPIC RADICAL PROSTATECTOMY N/A 10/11/2018   Procedure: ROBOTIC ASSISTED LAPAROSCOPIC RADICAL PROSTATECTOMY;  Surgeon: Hollice Espy, MD;  Location: [facility] ORS;  Service: Urology;  Laterality: N/A;   Social History   Tobacco Use  . Smoking status: Former Smoker    Types: Cigars  . Smokeless tobacco: Never Used  Vaping Use  . Vaping Use: Never used  Substance Use Topics  . Alcohol use: Yes    Alcohol/week: 5.0 standard drinks    Types: 5 Glasses of wine per week  . Drug use: No   Family Status  Relation Name Status  . Mother  Deceased at age 67  . Father  Deceased at age 73       from stroke  . Brother  Deceased at age 80       stomach cancer  . Brother  Alive  . Sister  Alive  . Brother  Alive  . Brother  Alive  . Brother  Alive  . Brother  Alive  . Brother  Alive  . Neg Hx  (Not Specified)   No Known Allergies    Medications: Outpatient Medications Prior to Visit  Medication Sig  . diltiazem (CARDIZEM CD) 240 MG 24 hr capsule TAKE 1 CAPSULE BY MOUTH EVERY DAY  . fluticasone (FLONASE) 50 MCG/ACT nasal spray Place 1 spray into both nostrils daily.  Marland Kitchen ibuprofen (ADVIL) 200 MG tablet Take 400 mg by mouth every 8 (eight) hours as needed for mild pain.  Marland Kitchen lisinopril (ZESTRIL) 40 MG tablet  TAKE 1 TABLET BY MOUTH EVERY DAY  . Multiple Vitamin (MULTIVITAMIN WITH MINERALS) TABS tablet Take 1 tablet by mouth daily.  Marland Kitchen omeprazole (PRILOSEC) 40 MG capsule Take 1 capsule (40 mg total) by mouth daily as needed (acid reflux/indigestion). (Patient not taking: Reported on 03/01/2020)  . sildenafil (REVATIO) 20 MG tablet TAKE ONE TABLET BY MOUTH ON AN EMPTY STOMACH 1 HOUR PRIOR TO INTERCOURSE, MAY TITRATE UP AS NEEDED 100MG  MAX (Patient not taking: Reported on 03/01/2020)   No facility-administered medications prior to visit.    Review of Systems  Constitutional: Negative for chills, diaphoresis, fatigue and  fever.  HENT: Positive for congestion (chest congestion), postnasal drip, rhinorrhea, sinus pressure and sinus pain (over left eye). Negative for nosebleeds, sneezing and sore throat.   Respiratory: Positive for cough (productive with clear sputum). Negative for shortness of breath and wheezing.   Neurological: Negative for headaches.      Objective    There were no vitals taken for this visit.   Physical Exam: WDWN male in no apparent distress.  Head: Normocephalic, atraumatic. Describes left frontal pressure discomfort. Neck: Supple, NROM Respiratory: No apparent distress Psych: Normal mood and affect Throat: No redness or exudates visible.     Assessment & Plan     1. Subacute frontal sinusitis Persistent PND with rhinorrhea and left frontal sinus pressure discomfort despite use of Mucinex since 02-15-20. No fever, cough, sore throat or earache. May add Flonase Nasal Spray, Claritin and Amoxicillin. Increase fluid intake. Monitor for COVID symptoms (had 2nd COVID vaccination in March 2021). - amoxicillin (AMOXIL) 875 MG tablet; Take 1 tablet (875 mg total) by mouth 2 (two) times daily.  Dispense: 20 tablet; Refill: 0   No follow-ups on file.     I discussed the assessment and treatment plan with the patient. The patient was provided an opportunity to ask questions and all were answered. The patient agreed with the plan and demonstrated an understanding of the instructions.   The patient was advised to call back or seek an in-person evaluation if the symptoms worsen or if the condition fails to improve as anticipated.  I provided 20 minutes of non-face-to-face time during this encounter.  Andres Shad, PA, have reviewed all documentation for this visit. The documentation on 03/01/20 for the exam, diagnosis, procedures, and orders are all accurate and complete.   Vernie Murders, [place] [phone] (phone) [phone] (fax)  [place]

## 2020-03-13 NOTE — Progress Notes (Signed)
03/14/2020 10:54 AM   Jack Weaver [birth year] [id number]  Referring provider: [name], MD [address] [place],  [place] [address] Chief Complaint  Patient presents with  . Prostate Cancer    HPI: Jack Weaver is a 64 y.o. male who returns for a 4 month follow up of prostate cancer, stress incontinence and Eligard.   He underwent radical prostatectomy non-nerve sparing with bilateral pelvic lymph node dissection on 10/01/2018.Surgical pathology was consistent with Gleason 3+5 disease, pT3a N1.He was noted to have extraprostatic extension with positive posterior margins both left and right. He is also noted to have 2 of 3 lymph nodes on the left with focal metastatic disease. 0 of 3 lymph nodes on the right. No bladder neck involvement. No seminal vesicle involvement.  Postoperative PSA from 11/08/2018 0.4.Staging with [employer] PET scan was negative for metastatic disease. He was subsequently started on Lupron.  Decline whole pelvic radiation.  Patient last injection of Eligard 6 month 45 mg was on 09/20/2019.  His PSA had risen but he was found to be not castrate.  He received another dose and his PSA subsequently declined   PSA is undetectable as of 02/28/2020.  He has a knot that formed after his last Eligard injection. He has less frequent hot flashes. Patient is concerned about being on injections long term.   PSA trend: Component     Latest Ref Rng & Units 09/13/2019 09/20/2019 10/21/2019 02/28/2020  Prostate Specific Ag, Serum     0.0 - 4.0 ng/mL 0.7 0.7 0.2 <0.1     PMH: Past Medical History:  Diagnosis Date  . Arthritis   . Cancer ([place])   . Hypertension     Surgical History: Past Surgical History:  Procedure Laterality Date  . APPENDECTOMY     At age 89  . HEMORROIDECTOMY  1930  . PELVIC LYMPH NODE DISSECTION Bilateral 10/11/2018   Procedure: PELVIC LYMPH NODE DISSECTION;  Surgeon: Hollice Espy, MD;  Location: [facility] ORS;   Service: Urology;  Laterality: Bilateral;  . QUADRICEPS TENDON REPAIR Left 09/09/2017   Procedure: REPAIR QUADRICEP TENDON;  Surgeon: Earnestine Leys, MD;  Location: [facility] OR[facility];  Service: Orthopedics;  Laterality: Left;  . ROBOT ASSISTED LAPAROSCOPIC RADICAL PROSTATECTOMY N/A 10/11/2018   Procedure: ROBOTIC ASSISTED LAPAROSCOPIC RADICAL PROSTATECTOMY;  Surgeon: Hollice Espy, MD;  Location: [facility] ORS;  Service: Urology;  Laterality: N/A;    Home Medications:  Allergies as of 03/14/2020   No Known Allergies     Medication List       Accurate as of March 14, 2020 10:54 AM. If you have any questions, ask your nurse or doctor.        STOP taking these medications   amoxicillin 875 MG tablet Commonly known as: AMOXIL Stopped by: Hollice Espy, MD   diltiazem 240 MG 24 hr capsule Commonly known as: CARDIZEM CD Stopped by: Hollice Espy, MD   doxycycline 100 MG capsule Commonly known as: MONODOX Stopped by: Hollice Espy, MD     TAKE these medications   diltiazem 240 MG 24 hr capsule Commonly known as: TIAZAC diltiazem CD 240 mg capsule,extended release 24 hr  TAKE 1 CAPSULE BY MOUTH EVERY DAY   fluticasone 50 MCG/ACT nasal spray Commonly known as: Flonase Place 1 spray into both nostrils daily.   ibuprofen 200 MG tablet Commonly known as: ADVIL Take 400 mg by mouth every 8 (eight) hours as needed for mild pain.   lisinopril 40 MG tablet Commonly  known as: ZESTRIL TAKE 1 TABLET BY MOUTH EVERY DAY   meloxicam 15 MG tablet Commonly known as: MOBIC Take 15 mg by mouth daily.   multivitamin with minerals Tabs tablet Take 1 tablet by mouth daily.   omeprazole 40 MG capsule Commonly known as: PRILOSEC Take 1 capsule (40 mg total) by mouth daily as needed (acid reflux/indigestion).   sildenafil 20 MG tablet Commonly known as: REVATIO TAKE ONE TABLET BY MOUTH ON AN EMPTY STOMACH 1 HOUR PRIOR TO INTERCOURSE, MAY TITRATE UP AS NEEDED 100MG  MAX       Allergies: No  Known Allergies  Family History: Family History  Problem Relation Age of Onset  . Heart attack Mother   . Stroke Father   . Asthma Father   . Cancer Brother   . Diabetes Brother   . Prostate cancer Neg Hx   . Kidney cancer Neg Hx   . Bladder Cancer Neg Hx     Social History:  reports that he has quit smoking. His smoking use included cigars. He has never used smokeless tobacco. He reports current alcohol use of about 5.0 standard drinks of alcohol per week. He reports that he does not use drugs.   Physical Exam: BP (!) 167/97   Pulse 80   Constitutional:  Alert and oriented, No acute distress. HEENT: [place] AT, moist mucus membranes.  Trachea midline, no masses. Cardiovascular: No clubbing, cyanosis, or edema. Respiratory: Normal respiratory effort, no increased work of breathing. Skin: No rashes, bruises or suspicious lesions. Neurologic: Grossly intact, no focal deficits, moving all 4 extremities. Psychiatric: Normal mood and affect.  Laboratory Data:  Lab Results  Component Value Date   CREATININE 0.96 08/02/2019   Lab Results  Component Value Date   TESTOSTERONE <3 (L) 10/21/2019    Assessment & Plan:    1. Prostate cancer PSA is undetectable. Last 6 month Eligard injection 45 mg on 09/20/2019.  Eligard 6 month depot today Discussed addition of apalutamide or other androgen receptor blocker which could potentially decrease recurrence/improve disease-free survival He is concerned about cost- may qualify for PAP program Patient agrees with plan.   2. Stress incontinence  Minimally symptomatic- improving  F/u 6 months   [facility] [address] [place], [place] [address] [phone]  I, Selena Batten, am acting as a scribe for Dr. Hollice Espy.  I have reviewed the above documentation for accuracy and completeness, and I agree with the above.   Hollice Espy, MD

## 2020-03-14 ENCOUNTER — Other Ambulatory Visit: Payer: Self-pay

## 2020-03-14 ENCOUNTER — Ambulatory Visit (INDEPENDENT_AMBULATORY_CARE_PROVIDER_SITE_OTHER): Payer: 59 | Admitting: Urology

## 2020-03-14 VITALS — BP 167/97 | HR 80

## 2020-03-14 DIAGNOSIS — C61 Malignant neoplasm of prostate: Secondary | ICD-10-CM | POA: Diagnosis not present

## 2020-03-14 MED ORDER — LEUPROLIDE ACETATE (6 MONTH) 45 MG ~~LOC~~ KIT
45.0000 mg | PACK | Freq: Once | SUBCUTANEOUS | Status: AC
  Administered 2020-03-14: 45 mg via SUBCUTANEOUS

## 2020-03-27 ENCOUNTER — Ambulatory Visit: Payer: Self-pay

## 2020-03-27 NOTE — Telephone Encounter (Signed)
Pt. Reports he started having palpitations 2 days ago. "They seem to be more often." No chest pain. 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 States he is having some shortness of breath. Will go to ED there in [place].  Reason for Disposition . Difficulty breathing  Answer Assessment - Initial Assessment Questions 1. DESCRIPTION: "Please describe your heart rate or heartbeat that you are having" (e.g., fast/slow, regular/irregular, skipped or extra beats, "palpitations")     Palpitations 2. ONSET: "When did it start?" (Minutes, hours or days)      Yesterday 3. DURATION: "How long does it last" (e.g., seconds, minutes, hours)     Lasts seconds 4. PATTERN "Does it come and go, or has it been constant since it started?"  "Does it get worse with exertion?"   "Are you feeling it now?"     Worse with exertion 5. TAP: "Using your hand, can you tap out what you are feeling on a chair or table in front of you, so that I can hear?" (Note: not all patients can do this)       No 6. HEART RATE: "Can you tell me your heart rate?" "How many beats in 15 seconds?"  (Note: not all patients can do this)       No 7. RECURRENT SYMPTOM: "Have you ever had this before?" If Yes, ask: "When was the last time?" and "What happened that time?"      Yes 8. CAUSE: "What do you think is causing the palpitations?"     Unsure 9. CARDIAC HISTORY: "Do you have any history of heart disease?" (e.g., heart attack, angina, bypass surgery, angioplasty, arrhythmia)      Palpitations 10. OTHER SYMPTOMS: "Do you have any other symptoms?" (e.g., dizziness, chest pain, sweating, difficulty breathing)       Tired 11. PREGNANCY: "Is there any chance you are pregnant?" "When was your last menstrual period?"       n/a  Protocols used: HEART RATE AND HEARTBEAT QUESTIONS-A-AH

## 2020-03-28 ENCOUNTER — Other Ambulatory Visit: Payer: Self-pay

## 2020-03-28 ENCOUNTER — Encounter: Payer: Self-pay | Admitting: Internal Medicine

## 2020-03-28 ENCOUNTER — Ambulatory Visit (INDEPENDENT_AMBULATORY_CARE_PROVIDER_SITE_OTHER): Payer: Managed Care, Other (non HMO) | Admitting: Internal Medicine

## 2020-03-28 ENCOUNTER — Ambulatory Visit (INDEPENDENT_AMBULATORY_CARE_PROVIDER_SITE_OTHER): Payer: 59

## 2020-03-28 ENCOUNTER — Other Ambulatory Visit: Payer: Self-pay | Admitting: *Deleted

## 2020-03-28 VITALS — BP 132/92 | HR 51 | Ht 71.0 in | Wt 220.0 lb

## 2020-03-28 DIAGNOSIS — I493 Ventricular premature depolarization: Secondary | ICD-10-CM

## 2020-03-28 DIAGNOSIS — R55 Syncope and collapse: Secondary | ICD-10-CM

## 2020-03-28 DIAGNOSIS — R002 Palpitations: Secondary | ICD-10-CM

## 2020-03-28 DIAGNOSIS — R0602 Shortness of breath: Secondary | ICD-10-CM | POA: Diagnosis not present

## 2020-03-28 DIAGNOSIS — I10 Essential (primary) hypertension: Secondary | ICD-10-CM

## 2020-03-28 MED ORDER — METOPROLOL SUCCINATE ER 25 MG PO TB24: 25.0000 mg | ORAL_TABLET | Freq: Every day | ORAL | 4 refills | Status: DC

## 2020-03-28 NOTE — Progress Notes (Signed)
New Outpatient Visit Date: 03/28/2020  Referring Provider: [name], MD [address] [place],  [place] [address]  Chief Complaint: Palpitations  HPI:  [name] is a 64 y.o. male who is being seen today for the evaluation of PVCs and near syncope at the request of Dr. Rosanna Randy. He has a history of hypertension, prostate cancer, and arthritis. He presents today for evaluation of palpitations that began spontaneously 4 days ago.  He reports irregular/skipped beats that are very bothersome.  He denies frank chest pain but states that the palpitations make him uncomfortable.  He also feels associated pressure in his neck.  The episodes usually last 5-10 minutes and happen multiple times per day.  He has felt similar palpitations in the past, though usually not as frequent.  He was evaluated several years ago by Dr. Clayborn Bigness for similar symptoms and was told that there was nothing that could be done to resolve the palpitations.  He has been on diltiazem long term.  He started taking a magnesium supplement yesterday afternoon but does not feel any different.  On account of his worsening palpitations, [name] was seen at the [facility] ED yesterday, where PVC's were noted on telemetry.  Labs were unrevealing.  [name] notes some intermittent lightheadedness during the palpitation episodes but no syncope.  He also feels a little short of breath when they happen.  Sometimes the palpitations come on when he first start to walk, though they also seem to be more noticeable when he lies down at night.  He began drink green tea last week to help loosen phlegm that began recently after he stopped smoking.  --------------------------------------------------------------------------------------------------  Cardiovascular History & Procedures: Cardiovascular Problems:  PVC's  Risk Factors:  Hypertension, male gender, age > 69, and prior tobacco use  Cath/PCI:  None  CV  Surgery:  None  EP Procedures and Devices:  None available  Non-Invasive Evaluation(s):  None available  Recent CV Pertinent Labs: Lab Results  Component Value Date   CHOL 167 07/02/2016   CHOL 178 06/22/2011   HDL 63 07/02/2016   HDL 46 06/22/2011   LDLCALC 86 07/02/2016   LDLCALC 110 (H) 06/22/2011   TRIG 90 07/02/2016   TRIG 109 06/22/2011   INR 1.0 10/07/2018   INR 0.9 06/21/2011   K 3.5 08/02/2019   K 3.6 06/22/2011   MG 1.9 10/18/2018   BUN 16 08/02/2019   BUN 17 01/05/2018   BUN 25 (H) 06/22/2011   CREATININE 0.96 08/02/2019   CREATININE 1.11 12/31/2012    --------------------------------------------------------------------------------------------------  Past Medical History:  Diagnosis Date  . Arthritis   . Hypertension   . Prostate cancer [facility])     Past Surgical History:  Procedure Laterality Date  . APPENDECTOMY     At age 55  . HEMORROIDECTOMY  1930  . PELVIC LYMPH NODE DISSECTION Bilateral 10/11/2018   Procedure: PELVIC LYMPH NODE DISSECTION;  Surgeon: [place] Espy, MD;  Location: [facility] ORS;  Service: Urology;  Laterality: Bilateral;  . QUADRICEPS TENDON REPAIR Left 09/09/2017   Procedure: REPAIR QUADRICEP TENDON;  Surgeon: Earnestine Leys, MD;  Location: [facility] ORS;  Service: Orthopedics;  Laterality: Left;  . ROBOT ASSISTED LAPAROSCOPIC RADICAL PROSTATECTOMY N/A 10/11/2018   Procedure: ROBOTIC ASSISTED LAPAROSCOPIC RADICAL PROSTATECTOMY;  Surgeon: [place] Espy, MD;  Location: [facility] ORS;  Service: Urology;  Laterality: N/A;    Current Meds  Medication Sig  . diltiazem (TIAZAC) 240 MG 24 hr capsule diltiazem CD 240 mg  capsule,extended release 24 hr  TAKE 1 CAPSULE BY MOUTH EVERY DAY  . fluticasone (FLONASE) 50 MCG/ACT nasal spray Place 1 spray into both nostrils daily.  Marland Kitchen ibuprofen (ADVIL) 200 MG tablet Take 400 mg by mouth every 8 (eight) hours as needed for mild pain.  Marland Kitchen lisinopril (ZESTRIL) 40 MG tablet TAKE 1 TABLET BY MOUTH EVERY DAY  .  meloxicam (MOBIC) 15 MG tablet Take 15 mg by mouth daily.  . Multiple Vitamin (MULTIVITAMIN WITH MINERALS) TABS tablet Take 1 tablet by mouth daily.  Marland Kitchen omeprazole (PRILOSEC) 40 MG capsule Take 1 capsule (40 mg total) by mouth daily as needed (acid reflux/indigestion).  . sildenafil (REVATIO) 20 MG tablet TAKE ONE TABLET BY MOUTH ON AN EMPTY STOMACH 1 HOUR PRIOR TO INTERCOURSE, MAY TITRATE UP AS NEEDED 100MG  MAX    Allergies: Patient has no known allergies.  Social History   Tobacco Use  . Smoking status: Former Smoker    Types: Cigars  . Smokeless tobacco: Never Used  Vaping Use  . Vaping Use: Never used  Substance Use Topics  . Alcohol use: Yes    Alcohol/week: 2.0 standard drinks    Types: 2 Cans of beer per week  . Drug use: No    Family History  Problem Relation Age of Onset  . Heart attack Mother   . Stroke Father   . Asthma Father   . Cancer Brother   . Diabetes Brother   . Prostate cancer Neg Hx   . Kidney cancer Neg Hx   . Bladder Cancer Neg Hx     Review of Systems: A 12-system review of systems was performed and was negative except as noted in the HPI.  --------------------------------------------------------------------------------------------------  Physical Exam: BP 122/82 (BP Location: Right Arm, Patient Position: Sitting, Cuff Size: Normal)   Pulse (!) 51   Ht 5\' 11"  (1.803 m)   Wt 220 lb (99.8 kg)   SpO2 98%   BMI 30.68 kg/m   General:  NAD HEENT: No conjunctival pallor or scleral icterus. Facemask in place. Neck: Supple without lymphadenopathy, thyromegaly, JVD, or HJR. No carotid bruit. Lungs: Normal work of breathing. Clear to auscultation bilaterally without wheezes or crackles. Heart: Bradycardic but regular with occasional extrasystoles.  No murmurs, rubs, or gallops. Non-displaced PMI. Abd: Bowel sounds present. Soft, NT/ND without hepatosplenomegaly Ext: No lower extremity edema. Radial, PT, and DP pulses are 2+ bilaterally Skin: Warm  and dry without rash. Neuro: CNIII-XII intact. Strength and fine-touch sensation intact in upper and lower extremities bilaterally. Psych: Normal mood and affect.  EKG:  Sinus bradycardia (HR 51 bpm) with non-specific T wave abnormality.  No significant change since 08/02/2019.  Lab Results  Component Value Date   WBC 3.2 (L) 08/02/2019   HGB 14.7 08/02/2019   HCT 44.4 08/02/2019   MCV 84.3 08/02/2019   PLT 201 08/02/2019    Lab Results  Component Value Date   NA 140 08/02/2019   K 3.5 08/02/2019   CL 106 08/02/2019   CO2 24 08/02/2019   BUN 16 08/02/2019   CREATININE 0.96 08/02/2019   GLUCOSE 165 (H) 08/02/2019   ALT 21 08/02/2019    Lab Results  Component Value Date   CHOL 167 07/02/2016   HDL 63 07/02/2016   LDLCALC 86 07/02/2016   TRIG 90 07/02/2016     --------------------------------------------------------------------------------------------------  ASSESSMENT AND PLAN: Palpitations, shortness of breath, and PVC's: Palpitations have been much more frequent over the last 4 days, with PVC's reportedly noted during  yesterday's ED visit in [place].  Labs were unrevealing at that time, though Mg and TSH were not checked; I have recommended checking them today.  We will also arrange for a 14 day event monitor and echocardiogram.  We will also stop diltiazem and start metoprolol succinate 25 mg daily.  I encouraged [name] to minimize his caffeine intake.  Based on results of echo/monitor and response to metoprolol, we may need to consider ischemia testing in the future.  Hypertension: Blood pressure upper normal.  As above, we will stop diltiazem and start metoprolol succinate 25 mg daily.  No other medication changes to be made today.  Follow-up: Return to clinic in 1 month.  Nelva Bush, MD 03/28/2020 2:17 PM

## 2020-03-28 NOTE — Patient Instructions (Signed)
Medication Instructions:  Your physician has recommended you make the following change in your medication:  1- STOP Diltiazem. 2- START Metoprolol succinate 25 mg by mouth once a day.  *If you need a refill on your cardiac medications before your next appointment, please call your pharmacy*   Lab Work: Your physician recommends that you return for lab work in: TODAY - TSH, Mg.  If you have labs (blood work) drawn today and your tests are completely normal, you will receive your results only by: Marland Kitchen MyChart Message (if you have MyChart) OR . A paper copy in the mail If you have any lab test that is abnormal or we need to change your treatment, we will call you to review the results.   Testing/Procedures: Your physician has requested that you have an echocardiogram. Echocardiography is a painless test that uses sound waves to create images of your heart. It provides your doctor with information about the size and shape of your heart and how well your heart's chambers and valves are working. This procedure takes approximately one hour. There are no restrictions for this procedure. You may get an IV, if needed, to receive an ultrasound enhancing agent through to better visualize your heart.   ZIO XT MONITOR FOR 14 DAYS Your physician has recommended that you wear a Zio monitor. This monitor is a medical device that records the heart's electrical activity. Doctors most often use these monitors to diagnose arrhythmias. Arrhythmias are problems with the speed or rhythm of the heartbeat. The monitor is a small device applied to your chest. You can wear one while you do your normal daily activities. While wearing this monitor if you have any symptoms to push the button and record what you felt. Once you have worn this monitor for the period of time provider prescribed (Usually 14 days), you will return the monitor device in the postage paid box. Once it is returned they will download the data collected  and provide [place] with a report which the provider will then review and we will call you with those results. Important tips:  1. Avoid showering during the first 24 hours of wearing the monitor. 2. Avoid excessive sweating to help maximize wear time. 3. Do not submerge the device, no hot tubs, and no swimming pools. 4. Keep any lotions or oils away from the patch. 5. After 24 hours you may shower with the patch on. Take brief showers with your back facing the shower head.  6. Do not remove patch once it has been placed because that will interrupt data and decrease adhesive wear time. 7. Push the button when you have any symptoms and write down what you were feeling. 8. Once you have completed wearing your monitor, remove and place into box which has postage paid and place in your outgoing mailbox.  9. If for some reason you have misplaced your box then call our office and we can provide another box and/or mail it off for you.   Follow-Up: At [facility], you and your health needs are our priority.  As part of our continuing mission to provide you with exceptional heart care, we have created designated Provider Care Teams.  These Care Teams include your primary Cardiologist (physician) and Advanced Practice Providers (APPs -  Physician Assistants and Nurse Practitioners) who all work together to provide you with the care you need, when you need it.  We recommend signing up for the patient portal called "MyChart".  Sign up information is  provided on this After Visit Summary.  MyChart is used to connect with patients for Virtual Visits (Telemedicine).  Patients are able to view lab/test results, encounter notes, upcoming appointments, etc.  Non-urgent messages can be sent to your provider as well.   To learn more about what you can do with MyChart, go to [email or web address].    Your next appointment:   1 month(s)  The format for your next appointment:   In Person  Provider:   You may see  DR Harrell Gave END or one of the following Advanced Practice Providers on your designated Care Team:    Murray Hodgkins, NP  Christell Faith, PA-C  Marrianne Mood, PA-C  Cadence [place], [place]

## 2020-03-29 ENCOUNTER — Encounter: Payer: Self-pay | Admitting: Internal Medicine

## 2020-03-29 ENCOUNTER — Telehealth: Payer: Self-pay

## 2020-03-29 DIAGNOSIS — I493 Ventricular premature depolarization: Secondary | ICD-10-CM | POA: Insufficient documentation

## 2020-03-29 DIAGNOSIS — R002 Palpitations: Secondary | ICD-10-CM | POA: Insufficient documentation

## 2020-03-29 DIAGNOSIS — R0602 Shortness of breath: Secondary | ICD-10-CM | POA: Insufficient documentation

## 2020-03-29 LAB — ALB+AST+BUN+CA+CREAT+GGT+GI...
AST: 19 IU/L (ref 0–40)
Albumin: 4.7 g/dL (ref 3.8–4.8)
BUN: 18 mg/dL (ref 8–27)
Bilirubin Total: 0.3 mg/dL (ref 0.0–1.2)
Calcium: 9.5 mg/dL (ref 8.6–10.2)
Creatinine, Ser: 1.38 mg/dL — ABNORMAL HIGH (ref 0.76–1.27)
GFR calc Af Amer: 61 mL/min/{1.73_m2} (ref >59)
GFR calc non Af Amer: 53 mL/min/{1.73_m2} — ABNORMAL LOW (ref >59)
GGT: 35 IU/L (ref 0–65)
Glucose: 98 mg/dL (ref 65–99)
LDH: 172 IU/L (ref 121–224)
Potassium: 4.8 mmol/L (ref 3.5–5.2)
Sodium: 139 mmol/L (ref 134–144)
Total Protein: 7.1 g/dL (ref 6.0–8.5)
Uric Acid: 4.2 mg/dL (ref 3.8–8.4)

## 2020-03-29 LAB — TSH: TSH: 1.33 u[IU]/mL (ref 0.450–4.500)

## 2020-03-29 LAB — MAGNESIUM: Magnesium: 2.3 mg/dL (ref 1.6–2.3)

## 2020-03-29 MED ORDER — APALUTAMIDE 60 MG PO TABS: 240.0000 mg | ORAL_TABLET | Freq: Every day | ORAL | 6 refills | Status: DC

## 2020-03-29 NOTE — Telephone Encounter (Signed)
-----   Message from [place] Espy, MD sent at 03/14/2020  1:43 PM EDT ----- Apalutamide for this guy?

## 2020-03-29 NOTE — Telephone Encounter (Signed)
Script sent to [facility] and reached out to rep to see about insurance coverage for patient

## 2020-04-03 ENCOUNTER — Telehealth: Payer: Self-pay | Admitting: Internal Medicine

## 2020-04-03 NOTE — Telephone Encounter (Signed)
Update from [facility] PA required notes and signature needed, faxed back

## 2020-04-03 NOTE — Telephone Encounter (Signed)
Nelva Bush, MD  04/03/2020 1:27 PM EST     Please let Mr. Cruise know that his electrolytes and thyroid function are normal. His creatinine is above baseline, which could be due to dehydration. I recommend that he increase his water intake and follow-up as previously recommended.   Results called to pt. Pt verbalized understanding of results.  He is still complaining for the palpitations and fluttering in his heart. Its still about the same as what he reported at office visit.  Reports the episodes don't last as long as before when he was on diltiazem but their almost every time he is doing activity such as walking or working. When sitting or at rest he is fine. Today's vital signs were 147/85, HR 60. Denies chest pain, shortness of breath, dizziness.  He wanted to be seen this week so he is currently scheduled for 04/06/20.  He is still wearing the ZIO which was applied on 03/28/20. His echo is 04/17/20 and next follow up was supposed to be 04/30/20. I discussed the plan of care and said I would update Dr End for any further advice and let him know.

## 2020-04-03 NOTE — Telephone Encounter (Signed)
Spoke to patient and he verbalized understanding of Dr Darnelle Bos recommendations.  He is agreeable to continue with the plan of care. He will call us if symptoms worsen.

## 2020-04-03 NOTE — Telephone Encounter (Signed)
Patient wants to discuss recent lab results and also wants asap ov.   He is aware of upcoming echo and fu but declined to wait .  He wants to see someone in the office asap for increased frequency of symptoms.

## 2020-04-03 NOTE — Telephone Encounter (Signed)
Mr. Mitrano should continue to wear the monitor and to take metoprolol as prescribed.  Unless new symptoms have developed, I don't think an office visit this week will offer him anything other than what was recommended at our last visit.  Nelva Bush, MD [facility]

## 2020-04-04 NOTE — Telephone Encounter (Signed)
[place] calling back to discuss clinical documentation that is needed for prior auth for [place].

## 2020-04-05 NOTE — Telephone Encounter (Signed)
Clinicals faxed to PA department

## 2020-04-06 ENCOUNTER — Other Ambulatory Visit: Payer: Self-pay | Admitting: Family Medicine

## 2020-04-06 ENCOUNTER — Ambulatory Visit: Payer: 59 | Admitting: Internal Medicine

## 2020-04-06 DIAGNOSIS — J0110 Acute frontal sinusitis, unspecified: Secondary | ICD-10-CM

## 2020-04-06 NOTE — Telephone Encounter (Signed)
Unable to successfully fax clinicals to PA department email attachment was sent over for review

## 2020-04-06 NOTE — Telephone Encounter (Signed)
Requested medication (s) are due for refill today: yes  Requested medication (s) are on the active medication list: no  Last refill: 03/01/20  Future visit scheduled: no  Notes to clinic:  no assigned protocol; d/c'd 03/14/20    Requested Prescriptions  Pending Prescriptions Disp Refills   amoxicillin (AMOXIL) 875 MG tablet [Pharmacy Med Name: AMOXICILLIN 875 MG TABLET] 20 tablet 0    Sig: TAKE 1 TABLET BY MOUTH TWICE A DAY      Off-Protocol Failed - 04/06/2020  1:46 PM      Failed - Medication not assigned to a protocol, review manually.      Passed - Valid encounter within last 12 months    Recent Outpatient Visits           1 month ago Subacute frontal sinusitis   [place], [place], [place]   1 month ago Viral URI   [facility] [name], MD   9 months ago Hordeolum internum of right upper eyelid   [facility] [place], [place]   1 year ago Need for pneumococcal vaccination   [facility] [name], MD   2 years ago Subacute maxillary sinusitis   [place], [place]       Future Appointments             In 3 weeks Gilford Rile, Martie Lee, NP [place], [place]   In 5 months [place], [place]

## 2020-04-10 ENCOUNTER — Telehealth: Payer: Self-pay | Admitting: *Deleted

## 2020-04-10 NOTE — Telephone Encounter (Signed)
[facility] advised

## 2020-04-10 NOTE — Telephone Encounter (Signed)
Should not be a problem.  [place] Espy, MD

## 2020-04-10 NOTE — Telephone Encounter (Signed)
Update received from [place] approved 04/09/20 through 04/09/21 PA# [id number] Copay$0 Patient will be contacted by pharmacy for delivery

## 2020-04-10 NOTE — Telephone Encounter (Signed)
Call from [facility], informing us of the interaction with Erleada and omeprazole, that the omeprazole can weaken the Erleada.  Spoke with patient and he only takes as needed not very much per pt.  [facility] stated they will contact the pt and mail.  Please advise if interaction ok or not.

## 2020-04-11 ENCOUNTER — Telehealth: Payer: Self-pay | Admitting: Family

## 2020-04-11 ENCOUNTER — Telehealth: Payer: Self-pay

## 2020-04-11 NOTE — Telephone Encounter (Signed)
Patient called voicing concerns about Erleada. He was under the impression it was one pill I advised him the proper dose is 4 pills.  He then proceeded to say he does not know if he will be able to handle the side effects as the hot flashes are bad with the Eligard. I told him the reason why you wanted him on the [place].  Jack Weaver stated he is currently under the care of a cardiologist and undergoing tests for chest pain and palpitations. He was in the ER on 03/27/2020. I advised patient to hold off on the medication until further recommendation.

## 2020-04-11 NOTE — Telephone Encounter (Signed)
Patient may need dose adjustment for hypertension medication and sildenafil, but no preventive dose adjustment is recommended.   Please monitor blood pressure 3 times per week and contact office if blood pressure start to increase.

## 2020-04-11 NOTE — Telephone Encounter (Signed)
Patient states he is being treated for prostate cancer and will start taking Erldada, and asks if this medication will interfere with his heart medications. Please call to discuss.

## 2020-04-12 NOTE — Telephone Encounter (Signed)
Patient made aware of our PharmDs response and recommendation. Patient verbalized understanding and has no other questions at this time.

## 2020-04-16 NOTE — Telephone Encounter (Signed)
The dose is in fact 4 tablets which is normal.  I would urge him to try the medication.  Typically, most we will do not notice any particular additional side effects beyond what he is already experiencing from the [place].  If he does experience side effects, we can cut back on the dose.  [place] Espy, MD

## 2020-04-16 NOTE — Telephone Encounter (Signed)
Patient Jack Weaver started taking medication on 04/14/21, has not experienced any side effects thus far. Will notify us with any changes. Voiced understanding.

## 2020-04-17 ENCOUNTER — Ambulatory Visit (INDEPENDENT_AMBULATORY_CARE_PROVIDER_SITE_OTHER): Payer: 59

## 2020-04-17 ENCOUNTER — Other Ambulatory Visit: Payer: Self-pay | Admitting: *Deleted

## 2020-04-17 ENCOUNTER — Other Ambulatory Visit: Payer: Self-pay

## 2020-04-17 DIAGNOSIS — I5189 Other ill-defined heart diseases: Secondary | ICD-10-CM

## 2020-04-17 DIAGNOSIS — R0602 Shortness of breath: Secondary | ICD-10-CM

## 2020-04-17 DIAGNOSIS — I517 Cardiomegaly: Secondary | ICD-10-CM

## 2020-04-17 HISTORY — DX: Other ill-defined heart diseases: I51.89

## 2020-04-17 HISTORY — DX: Cardiomegaly: I51.7

## 2020-04-17 LAB — ECHOCARDIOGRAM COMPLETE
AR max vel: 3.67 cm2
AV Area VTI: 3.43 cm2
AV Area mean vel: 2.95 cm2
AV Mean grad: 5 mmHg
AV Peak grad: 8.9 mmHg
Ao pk vel: 1.49 m/s
Area-P 1/2: 2.78 cm2
Calc EF: 55 %
S' Lateral: 3.2 cm
Single Plane A2C EF: 53.7 %
Single Plane A4C EF: 55.1 %

## 2020-04-18 ENCOUNTER — Telehealth: Payer: Self-pay | Admitting: *Deleted

## 2020-04-18 ENCOUNTER — Other Ambulatory Visit: Payer: Self-pay | Admitting: Family Medicine

## 2020-04-18 DIAGNOSIS — K2970 Gastritis, unspecified, without bleeding: Secondary | ICD-10-CM

## 2020-04-18 NOTE — Telephone Encounter (Signed)
-----   Message from Nelva Bush, MD sent at 04/18/2020  6:58 AM EST ----- Please let Jack Weaver know that his echocardiogram shows that his heart is contracting well.  The heart muscle is somewhat thickened and stiffened.  There is also mild to moderate leakage of the mitral and tricuspid valves.  His heart monitor shows occasional extra beats, which seem to correspond to his symptoms.  I suggest that he continue his current medications and follow-up as scheduled to reassess his symptoms and determine if additional testing is needed.

## 2020-04-18 NOTE — Telephone Encounter (Signed)
Please review. Thanks!  

## 2020-04-18 NOTE — Telephone Encounter (Signed)
[place] faxed refill request for the following medications:  lisinopril (ZESTRIL) 40 MG tablet   meloxicam (MOBIC) 15 MG tablet  metoprolol succinate (TOPROL-XL) 25 MG 24 hr tablet  omeprazole (PRILOSEC) 40 MG capsule    Please advise.

## 2020-04-18 NOTE — Telephone Encounter (Signed)
Pt aware via MyChart

## 2020-04-20 MED ORDER — OMEPRAZOLE 40 MG PO CPDR: 40.0000 mg | DELAYED_RELEASE_CAPSULE | Freq: Every day | ORAL | 4 refills | Status: DC | PRN

## 2020-04-20 MED ORDER — MELOXICAM 15 MG PO TABS: 15.0000 mg | ORAL_TABLET | Freq: Every day | ORAL | 3 refills | Status: DC

## 2020-04-20 MED ORDER — METOPROLOL SUCCINATE ER 25 MG PO TB24: 25.0000 mg | ORAL_TABLET | Freq: Every day | ORAL | 4 refills | Status: DC

## 2020-04-20 MED ORDER — LISINOPRIL 40 MG PO TABS: 40.0000 mg | ORAL_TABLET | Freq: Every day | ORAL | 5 refills | Status: DC

## 2020-04-22 ENCOUNTER — Other Ambulatory Visit: Payer: Self-pay | Admitting: Family Medicine

## 2020-04-22 DIAGNOSIS — K2970 Gastritis, unspecified, without bleeding: Secondary | ICD-10-CM

## 2020-04-22 NOTE — Telephone Encounter (Signed)
Change of pharmacy Requested Prescriptions  Pending Prescriptions Disp Refills  . omeprazole (PRILOSEC) 40 MG capsule [Pharmacy Med Name: OMEPRAZOLE DR 40 MG CAPSULE] 90 capsule 3    Sig: TAKE 1 CAPSULE (40 MG TOTAL) BY MOUTH DAILY AS NEEDED (ACID REFLUX/INDIGESTION).     Gastroenterology: Proton Pump Inhibitors Passed - 04/22/2020  8:40 AM      Passed - Valid encounter within last 12 months    Recent Outpatient Visits          1 month ago Subacute frontal sinusitis   [place], [place]   2 months ago Viral URI   [facility] [name], MD   10 months ago Hordeolum internum of right upper eyelid   [facility] [place], [place]   1 year ago Need for pneumococcal vaccination   [facility] [name], MD   2 years ago Subacute maxillary sinusitis   [place], [place]      Future Appointments            In 1 week Gilford Rile, Martie Lee, NP [place], [place]   In 4 months [place] Espy, MD [place]

## 2020-04-23 ENCOUNTER — Other Ambulatory Visit: Payer: Self-pay | Admitting: Family Medicine

## 2020-04-23 DIAGNOSIS — K2970 Gastritis, unspecified, without bleeding: Secondary | ICD-10-CM

## 2020-04-23 DIAGNOSIS — I10 Essential (primary) hypertension: Secondary | ICD-10-CM

## 2020-04-23 MED ORDER — OMEPRAZOLE 40 MG PO CPDR: 40.0000 mg | DELAYED_RELEASE_CAPSULE | Freq: Every day | ORAL | 3 refills | Status: DC | PRN

## 2020-04-23 MED ORDER — MELOXICAM 15 MG PO TABS
15.0000 mg | ORAL_TABLET | Freq: Every day | ORAL | 3 refills | Status: DC
Start: 2020-04-23 — End: 2021-04-19

## 2020-04-23 MED ORDER — LISINOPRIL 40 MG PO TABS: 40.0000 mg | ORAL_TABLET | Freq: Every day | ORAL | 3 refills | Status: DC

## 2020-04-23 MED ORDER — METOPROLOL SUCCINATE ER 25 MG PO TB24: 25.0000 mg | ORAL_TABLET | Freq: Every day | ORAL | 3 refills | Status: DC

## 2020-04-23 NOTE — Telephone Encounter (Addendum)
[place] faxed refill request for the following medications: [employer]  [phone] - phone [phone] - fax   omeprazole (PRILOSEC) 40 MG capsule, delayed relase lisinopril (ZESTRIL) 40 MG tablet meloxicam (MOBIC) 15 MG tablet metoprolol succinate (TOPROL-XL) 25 MG 24 hr tablet  Please advise. Thanks, [employer]

## 2020-04-25 ENCOUNTER — Telehealth: Payer: Self-pay | Admitting: Urology

## 2020-04-25 ENCOUNTER — Telehealth: Payer: Self-pay | Admitting: Family Medicine

## 2020-04-25 NOTE — Telephone Encounter (Signed)
Patient called the office today with report of increased BP since starting Erleada.    His most readings have been around 197/100.  He was advised to stop taking the Erleada and to get in touch with you for advice.  Please advise on how he should proceed.

## 2020-04-25 NOTE — Telephone Encounter (Signed)
Please advise 

## 2020-04-25 NOTE — Telephone Encounter (Signed)
Patient informed per Dr. [name] discontinue Jack Weaver for one week then start back. He is aware to keep track of readings. Reviewed instructions in detail, voiced understanding.

## 2020-04-25 NOTE — Telephone Encounter (Signed)
Pt needing to speak with someone regarding his Occipital Neralgia.   He wants to know what he can do to calm it down since it flared up last week. Please call patient back to advise.  Thanks, [employer]

## 2020-04-28 ENCOUNTER — Other Ambulatory Visit: Payer: Self-pay | Admitting: Family Medicine

## 2020-04-29 NOTE — Progress Notes (Signed)
Cardiology Office Note  Date:  04/30/2020   ID:  Jack Weaver, DOB 1956-04-26, MRN [id number]  PCP:  [name], MD  Cardiologist:  Dr. Saunders Revel  _____________  1 month follow-up for heart monitor and echo  _____________   History of Present Illness: Jack Weaver is a 64 y.o. male with pmh of HTN, prostate cancer, arthritis who was recently seen for palpitations/PVCS. Dilt was stopped and metoprolol was started.TSH and Mag were normal. It was recommended he minimize caffeine intake. Echo and heart monitor were ordered. Heart monitor showed NSR, average Hr 57 with brief episodes of PSVT, NSVT and rare PVCs/bigeminy. Echo showed LVEF 55-60%, mod LVH, G2DD.  Today, he reports he is doing much better. We reviewed his zio monitor and echo in detail today. He has been taking metoprolol. He feels palpitations have decreased. They used to be every day throughout the day and now they might occur once a day. Episodes are very brief. Heart rate low on EKG however he denies dizziness or lightheadedness. BP is high today. At home BP generally run high. At home BP ahs gotten as high as 196/100 and as low as SBPs in the 140s. He started treatment for prostate cancer and says this has increased his blood pressures. He quit smoking 3 months ago and this has also caused an increase in BP. He denies symptoms of  chest pain, shortness of breath, orthopnea, PND, lower extremity edema, claudication, dizziness, presyncope, syncope, bleeding, or neurologic sequela.    _____________   Past Medical History:  Diagnosis Date  . Arthritis   . Hypertension   . Prostate cancer [facility])    Past Surgical History:  Procedure Laterality Date  . APPENDECTOMY     At age 83  . HEMORROIDECTOMY  1930  . PELVIC LYMPH NODE DISSECTION Bilateral 10/11/2018   Procedure: PELVIC LYMPH NODE DISSECTION;  Surgeon: Hollice Espy, MD;  Location: [facility] ORS;  Service: Urology;  Laterality: Bilateral;  . QUADRICEPS TENDON REPAIR  Left 09/09/2017   Procedure: REPAIR QUADRICEP TENDON;  Surgeon: Earnestine Leys, MD;  Location: [facility] ORS;  Service: Orthopedics;  Laterality: Left;  . ROBOT ASSISTED LAPAROSCOPIC RADICAL PROSTATECTOMY N/A 10/11/2018   Procedure: ROBOTIC ASSISTED LAPAROSCOPIC RADICAL PROSTATECTOMY;  Surgeon: Hollice Espy, MD;  Location: [facility] ORS;  Service: Urology;  Laterality: N/A;   _____________  Current Outpatient Medications  Medication Sig Dispense Refill  . fluticasone (FLONASE) 50 MCG/ACT nasal spray Place 1 spray into both nostrils daily. 0.0055 g 2  . ibuprofen (ADVIL) 200 MG tablet Take 400 mg by mouth every 8 (eight) hours as needed for mild pain.    Marland Kitchen lisinopril (ZESTRIL) 40 MG tablet Take 1 tablet (40 mg total) by mouth daily. 90 tablet 3  . meloxicam (MOBIC) 15 MG tablet Take 1 tablet (15 mg total) by mouth daily. 90 tablet 3  . metoprolol succinate (TOPROL-XL) 25 MG 24 hr tablet Take 1 tablet (25 mg total) by mouth daily. 90 tablet 3  . Multiple Vitamin (MULTIVITAMIN WITH MINERALS) TABS tablet Take 1 tablet by mouth daily.    Marland Kitchen omeprazole (PRILOSEC) 40 MG capsule Take 1 capsule (40 mg total) by mouth daily as needed (acid reflux/indigestion). 90 capsule 3  . diltiazem (CARDIZEM CD) 240 MG 24 hr capsule Take 240 mg by mouth daily.      No current facility-administered medications for this visit.   _____________   Allergies:   Patient has no known allergies.  _____________   Social History:  The patient  reports that he has quit smoking. His smoking use included cigars. He has never used smokeless tobacco. He reports current alcohol use of about 2.0 standard drinks of alcohol per week. He reports that he does not use drugs.  _____________   Family History:  The patient's family history includes Asthma in his father; Cancer in his brother; Diabetes in his brother; Heart attack in his mother; Stroke in his father.  _____________   ROS:  Please see the history of present illness.   Positive  for occasional palpitations,   All other systems are reviewed and negative.  _____________   PHYSICAL EXAM: VS:  BP (!) 152/90 (BP Location: Right Arm, Patient Position: Sitting, Cuff Size: Normal)   Pulse (!) 49   Ht 5\' 11"  (1.803 m)   Wt 220 lb 4 oz (99.9 kg)   SpO2 99%   BMI 30.72 kg/m  , BMI Body mass index is 30.72 kg/m. GEN: Well nourished, well developed, in no acute distress  HEENT: normal  Neck: no JVD, carotid bruits, or masses Cardiac: RRR; no murmurs, rubs, or gallops. No clubbing, cyanosis, edema.  Radials/DP/PT 2+ and equal bilaterally.  Respiratory:  clear to auscultation bilaterally, normal work of breathing GI: soft, nontender, nondistended, + BS MS: no deformity or atrophy  Skin: warm and dry, no rash Neuro:  Strength and sensation are intact Psych: euthymic mood, full affect _____________  EKG:   The ekg ordered today shows SB, 49bpm, nonspecific TW wave changes lateral leads  Recent Labs: 08/02/2019: ALT 21; Hemoglobin 14.7; Platelets 201 03/28/2020: BUN 18; Creatinine, Ser 1.38; Magnesium 2.3; Potassium 4.8; Sodium 139; TSH 1.330  No results found for requested labs within last 8760 hours.  CrCl cannot be calculated (Patient's most recent lab result is older than the maximum 21 days allowed.).  Wt Readings from Last 3 Encounters:  04/30/20 220 lb 4 oz (99.9 kg)  03/28/20 220 lb (99.8 kg)  09/20/19 215 lb (97.5 kg)     Relevant studies: Echo 04/17/20  1. Left ventricular ejection fraction, by estimation, is 55 to 60%. The  left ventricle has normal function. The left ventricle has no regional  wall motion abnormalities. There is moderate left ventricular hypertrophy.  Left ventricular diastolic  parameters are consistent with Grade II diastolic dysfunction  (pseudonormalization). The average left ventricular global longitudinal  strain is -16.3 %. The global longitudinal strain is abnormal.  2. Right ventricular systolic function is normal. The right  ventricular  size is mildly enlarged. There is normal pulmonary artery systolic  pressure.  3. Left atrial size was moderately dilated.  4. Right atrial size was mildly dilated.  5. The mitral valve is normal in structure. Mild to moderate mitral valve  regurgitation. No evidence of mitral stenosis.  6. Tricuspid valve regurgitation is mild to moderate.  7. The aortic valve is tricuspid. There is mild calcification of the  aortic valve. There is moderate thickening of the aortic valve. Aortic  valve regurgitation is not visualized. Mild to moderate aortic valve  sclerosis/calcification is present, without  any evidence of aortic stenosis.  8. The inferior vena cava is normal in size with <50% respiratory  variability, suggesting right atrial pressure of 8 mmHg.   Heart monitor 03/2020   The patient was monitored for 6 days, 17 hours.  The predominant rhythm was sinus with an average rate of 56 bpm (range 42-132 bpm in sinus).  There were rare PACs and occasional PVCs (PVC burden 3.6%).  10 atrial runs lasting up to 16 beats with a maximum rate of 179 bpm were observed. A single 6 beat run of nonsustained ventricular tachycardia also occurred.  No sustained arrhythmia or prolonged pause was identified.  There were 117 patient triggered events during the monitoring period, corresponding to sinus rhythm, PACs, PVCs (including ventricular bigeminy), PSVT, and NSVT.   Predominately sinus rhythm with occasional PVCs and rare PACs.  Brief episodes of PSVT and NSVT also noted.  Numerous patient triggered events recorded, many of which correspond to ventricular bigeminy.   _____________   ASSESSMENT AND PLAN:   Palpitations/PVCs  Since starting metoprolol he feels symptoms have greatly improved. He hardly notices irregular heart beats. Heart monitor showed SR with average heart rate of 57 with brief episodes of PSVT, NSVT, and rare PVCs with ventricular bigeminy. Heart rate on  EKG today 49, he is asymptomatic with this. Continue BB.   Hypertension  BP elevated today to 150/90. Patient reports since starting cancer treatment and quitting smoking 3 months ago bp has gone up. At home SBP is generally in the 150s but it has gone up to sBPs 190s. Echo showed moderate LVH.  He takes lisinpril 40 mg and Toprol-XL 25mg . Cannot up titrate with baseline bradycardia. Recommended starting another medication however patient refused. He does not want to be on any more medications. I would consider starting amlodipine. He has a PCP visit later this week and will discuss with PCP.   Disposition:   FU with Dr. Saunders Revel in 6 months   Signed, Lakyia Behe Ninfa Meeker, PA-C 04/30/2020 4:05 PM    _____________ [facility] [address] [place] [address]  [phone] (office) [phone] (fax)

## 2020-04-30 ENCOUNTER — Ambulatory Visit: Payer: 59 | Admitting: Family Medicine

## 2020-04-30 ENCOUNTER — Other Ambulatory Visit: Payer: Self-pay

## 2020-04-30 ENCOUNTER — Ambulatory Visit (INDEPENDENT_AMBULATORY_CARE_PROVIDER_SITE_OTHER): Payer: 59 | Admitting: Medical

## 2020-04-30 ENCOUNTER — Encounter: Payer: Self-pay | Admitting: Medical

## 2020-04-30 VITALS — BP 152/90 | HR 49 | Ht 71.0 in | Wt 220.2 lb

## 2020-04-30 DIAGNOSIS — I493 Ventricular premature depolarization: Secondary | ICD-10-CM | POA: Diagnosis not present

## 2020-04-30 DIAGNOSIS — I10 Essential (primary) hypertension: Secondary | ICD-10-CM

## 2020-04-30 DIAGNOSIS — R002 Palpitations: Secondary | ICD-10-CM

## 2020-04-30 NOTE — Patient Instructions (Signed)
Medication Instructions:  No medication changes today.   *If you need a refill on your cardiac medications before your next appointment, please call your pharmacy*   Lab Work: None ordered today.   Testing/Procedures: Your EKG today showed sinus bradycardia which is a stable finding.    Follow-Up: At [facility], you and your health needs are our priority.  As part of our continuing mission to provide you with exceptional heart care, we have created designated Provider Care Teams.  These Care Teams include your primary Cardiologist (physician) and Advanced Practice Providers (APPs -  Physician Assistants and Nurse Practitioners) who all work together to provide you with the care you need, when you need it.  We recommend signing up for the patient portal called "MyChart".  Sign up information is provided on this After Visit Summary.  MyChart is used to connect with patients for Virtual Visits (Telemedicine).  Patients are able to view lab/test results, encounter notes, upcoming appointments, etc.  Non-urgent messages can be sent to your provider as well.   To learn more about what you can do with MyChart, go to [email or web address].    Your next appointment:   6 month(s)  The format for your next appointment:   In Person  Provider:   You may see Nelva Bush, MD or one of the following Advanced Practice Providers on your designated Care Team:    Murray Hodgkins, NP  Christell Faith, PA-C  Marrianne Mood, PA-C  Cadence Kathlen Mody, [place]  Laurann Montana, NP  Other Instructions  Tips to Measure your Blood Pressure Correctly  National and international guidelines offer specific instructions for measuring blood pressure. If a doctor, nurse, or medical assistant isn't doing it right, don't hesitate to ask him or her to get with the guidelines.  Here's what you can do to ensure a correct reading: . Don't drink a caffeinated beverage or smoke during the 30 minutes before the  test. . Sit quietly for five minutes before the test begins. . During the measurement, sit in a chair with your feet on the floor and your arm supported so your elbow is at about heart level. . The inflatable part of the cuff should completely cover at least 80% of your upper arm, and the cuff should be placed on bare skin, not over a shirt. . Don't talk during the measurement. . Have your blood pressure measured twice, with a brief break in between. If the readings are different by 5 points or more, have it done a third time.  In 2017, new guidelines from the [place], the [employer] of Cardiology, and nine other health organizations lowered the diagnosis of high blood pressure to 130/80 mm Hg or higher for all adults. The guidelines also redefined the various blood pressure categories to now include normal, elevated, Stage 1 hypertension, Stage 2 hypertension, and hypertensive crisis (see "Blood pressure categories").  Blood pressure categories  Blood pressure category SYSTOLIC (upper number)  DIASTOLIC (lower number)  Normal Less than 120 mm Hg and Less than 80 mm Hg  Elevated 120-129 mm Hg and Less than 80 mm Hg  High blood pressure: Stage 1 hypertension 130-139 mm Hg or 80-89 mm Hg  High blood pressure: Stage 2 hypertension 140 mm Hg or higher or 90 mm Hg or higher  Hypertensive crisis (consult your doctor immediately) Higher than 180 mm Hg and/or Higher than 120 mm Hg  Source: American Heart Association and American Stroke Association. For more on getting your blood  pressure under control, buy Controlling Your Blood Pressure, a Special Health Report from [facility].   Blood Pressure Log   Date   Time  Blood Pressure  Position  Example: Nov 1 9 AM 124/78 sitting

## 2020-05-01 NOTE — Telephone Encounter (Signed)
Ok to try 6 day prednisone taper if still needed.

## 2020-05-02 MED ORDER — PREDNISONE 10 MG PO TABS: ORAL_TABLET | ORAL | 0 refills | Status: DC

## 2020-05-02 NOTE — Telephone Encounter (Signed)
Patient stated he would like to have the prednisone taper. Rx sent to pharmacy.

## 2020-05-03 ENCOUNTER — Telehealth: Payer: Self-pay

## 2020-05-03 ENCOUNTER — Telehealth: Payer: Self-pay | Admitting: *Deleted

## 2020-05-03 ENCOUNTER — Ambulatory Visit: Payer: 59 | Admitting: Family Medicine

## 2020-05-03 NOTE — Telephone Encounter (Signed)
Pt calling stating that he has been having hot flashes, pt stopped Erleada 8 days ago and wanted to know if this is a side effect? Pt is also taking a new prednisone from his PCP. Please advise

## 2020-05-03 NOTE — Telephone Encounter (Signed)
Patient was advised.  

## 2020-05-03 NOTE — Telephone Encounter (Signed)
Attempted to inform patient message, phone lost connection.  Left VM with  Instructions asked to return call with any concerns.

## 2020-05-03 NOTE — Telephone Encounter (Signed)
Copied from [place] [id number]. Topic: General - Inquiry >> May 03, 2020 10:58 AM Jack Weaver wrote: Reason for CRM: Pt called saying since he took the prednisone yesterday he had hot flashes and seating really bad.  He wants to know if it could have been caused by the taking 6 prednisone   CB#  [phone]

## 2020-05-03 NOTE — Telephone Encounter (Signed)
It is very common to have hot flashes with Eligard his hormone injection.  This is probably what is causing his hot flashes.  Hollice Espy, MD

## 2020-05-03 NOTE — Telephone Encounter (Signed)
Absolutely it could be.  Cut back to 2 daily and then 1 on the last day.

## 2020-05-04 NOTE — Telephone Encounter (Signed)
Patient returned call, explained side effects in detail. Answered all questions. Voiced understanding.

## 2020-05-13 ENCOUNTER — Ambulatory Visit
Admission: EM | Admit: 2020-05-13 | Discharge: 2020-05-13 | Disposition: A | Discharge status: Home / Self Care | Payer: 59 | Attending: Family Medicine | Admitting: Family Medicine

## 2020-05-13 ENCOUNTER — Other Ambulatory Visit: Payer: Self-pay

## 2020-05-13 ENCOUNTER — Encounter: Payer: Self-pay | Admitting: Emergency Medicine

## 2020-05-13 DIAGNOSIS — J0110 Acute frontal sinusitis, unspecified: Secondary | ICD-10-CM | POA: Diagnosis not present

## 2020-05-13 MED ORDER — AMOXICILLIN-POT CLAVULANATE 875-125 MG PO TABS: 1.0000 | ORAL_TABLET | Freq: Two times a day (BID) | ORAL | 0 refills | Status: DC

## 2020-05-13 NOTE — ED Triage Notes (Signed)
Patient c/o sinus congestion and pressure, HA, and nasal congestion for the past 3 weeks.  Patient states that he has been taking OTC cold medicine with no relief.  Patient denies fevers. Patient denies cough.

## 2020-05-13 NOTE — ED Provider Notes (Signed)
[facility]    CSN: [id number] Arrival date & time: 05/13/20  0910      History   Chief Complaint Chief Complaint  Patient presents with  . Nasal Congestion   HPI   64 year old male presents with concerns for sinusitis.  Patient reports symptoms over the past 3 weeks.  Reports sinus congestion and pressure, associated headache.  He reports that he is blowing abnormal colored mucus out of his nose in the morning.  Blood-tinged.  He has been using over-the-counter medication without relief.  He was also recently prescribed prednisone and this did not help.  No fever.  Pain 3/10 in severity.  Located in the left frontal region.  Has had recent Covid testing which has been negative.  No other associated symptoms.  No other complaints.  Past Medical History:  Diagnosis Date  . Arthritis   . Hypertension   . Prostate cancer [facility])     Patient Active Problem List   Diagnosis Date Noted  . Palpitations 03/29/2020  . PVC's (premature ventricular contractions) 03/29/2020  . Shortness of breath 03/29/2020  . Postoperative ileus ([place]) 10/15/2018  . Right lower quadrant abdominal pain   . Prostate cancer ([place]) 10/11/2018  . Lumbar sprain 09/04/2017  . Traumatic rupture of quadriceps tendon 09/02/2017  . Bursitis of hip 05/31/2015  . ED (erectile dysfunction) of organic origin 11/18/2014  . Essential hypertension 11/18/2014  . Neuropathy 11/18/2014  . Allergic rhinitis 11/14/2014  . Arthropathia 11/26/2009  . History of tobacco use 07/31/2008  . Acid reflux 07/26/2008  . Arthritis, degenerative 07/26/2008  . Apnea, sleep 06/30/2008    Past Surgical History:  Procedure Laterality Date  . APPENDECTOMY     At age 51  . HEMORROIDECTOMY  1930  . PELVIC LYMPH NODE DISSECTION Bilateral 10/11/2018   Procedure: PELVIC LYMPH NODE DISSECTION;  Surgeon: Hollice Espy, MD;  Location: [facility] ORS;  Service: Urology;  Laterality: Bilateral;  . QUADRICEPS TENDON REPAIR Left  09/09/2017   Procedure: REPAIR QUADRICEP TENDON;  Surgeon: Earnestine Leys, MD;  Location: [facility] ORS;  Service: Orthopedics;  Laterality: Left;  . ROBOT ASSISTED LAPAROSCOPIC RADICAL PROSTATECTOMY N/A 10/11/2018   Procedure: ROBOTIC ASSISTED LAPAROSCOPIC RADICAL PROSTATECTOMY;  Surgeon: Hollice Espy, MD;  Location: [facility] ORS;  Service: Urology;  Laterality: N/A;       Home Medications    Prior to Admission medications   Medication Sig Start Date End Date Taking? Authorizing Provider  diltiazem (CARDIZEM CD) 240 MG 24 hr capsule Take 240 mg by mouth daily.  03/30/20  Yes [provider]  ERLEADA 60 MG tablet Take 240 mg by mouth daily. 05/07/20  Yes [provider]  lisinopril (ZESTRIL) 40 MG tablet Take 1 tablet (40 mg total) by mouth daily. 04/23/20  Yes [name], MD  metoprolol succinate (TOPROL-XL) 25 MG 24 hr tablet Take 1 tablet (25 mg total) by mouth daily. 04/23/20  Yes [name], MD  Multiple Vitamin (MULTIVITAMIN WITH MINERALS) TABS tablet Take 1 tablet by mouth daily.   Yes [provider]  omeprazole (PRILOSEC) 40 MG capsule Take 1 capsule (40 mg total) by mouth daily as needed (acid reflux/indigestion). 04/23/20  Yes [name], MD  amoxicillin-clavulanate (AUGMENTIN) 875-125 MG tablet Take 1 tablet by mouth 2 (two) times daily. 05/13/20   Coral Spikes, DO  fluticasone (FLONASE) 50 MCG/ACT nasal spray Place 1 spray into both nostrils daily. 08/02/19 08/01/20  Earleen Newport, MD  ibuprofen (ADVIL) 200  MG tablet Take 400 mg by mouth every 8 (eight) hours as needed for mild pain.    [provider]  meloxicam (MOBIC) 15 MG tablet Take 1 tablet (15 mg total) by mouth daily. 04/23/20   [name], MD    Family History Family History  Problem Relation Age of Onset  . Heart attack Mother   . Stroke Father   . Asthma Father   . Cancer Brother   . Diabetes Brother   . Prostate cancer Neg Hx   .  Kidney cancer Neg Hx   . Bladder Cancer Neg Hx     Social History Social History   Tobacco Use  . Smoking status: Former Smoker    Types: Cigars  . Smokeless tobacco: Never Used  Vaping Use  . Vaping Use: Never used  Substance Use Topics  . Alcohol use: Yes    Alcohol/week: 2.0 standard drinks    Types: 2 Cans of beer per week  . Drug use: No     Allergies   Prednisone   Review of Systems Review of Systems  Constitutional: Negative.   HENT: Positive for congestion, sinus pressure and sinus pain.   Neurological: Positive for headaches.   Physical Exam Triage Vital Signs ED Triage Vitals  Enc Vitals Group     BP 05/13/20 0924 (!) 160/91     Pulse Rate 05/13/20 0924 60     Resp 05/13/20 0924 16     Temp 05/13/20 0924 98.3 F (36.8 C)     Temp Source 05/13/20 0924 Oral     SpO2 05/13/20 0924 100 %     Weight 05/13/20 0920 216 lb (98 kg)     Height 05/13/20 0920 5\' 11"  (1.803 m)     Head Circumference --      Peak Flow --      Pain Score 05/13/20 0920 3     Pain Loc --      Pain Edu? --      Excl. in [place]? --    Updated Vital Signs BP (!) 160/91 (BP Location: Left Arm)   Pulse 60   Temp 98.3 F (36.8 C) (Oral)   Resp 16   Ht 5\' 11"  (1.803 m)   Wt 98 kg   SpO2 100%   BMI 30.13 kg/m   Visual Acuity Right Eye Distance:   Left Eye Distance:   Bilateral Distance:    Right Eye Near:   Left Eye Near:    Bilateral Near:     Physical Exam Vitals and nursing note reviewed.  Constitutional:      General: He is not in acute distress.    Appearance: Normal appearance. He is not ill-appearing.  HENT:     Head: Normocephalic and atraumatic.  Eyes:     General:        Right eye: No discharge.        Left eye: No discharge.     Conjunctiva/sclera: Conjunctivae normal.  Cardiovascular:     Rate and Rhythm: Normal rate and regular rhythm.     Heart sounds: No murmur heard.   Pulmonary:     Effort: Pulmonary effort is normal.     Breath sounds: Normal  breath sounds. No wheezing, rhonchi or rales.  Neurological:     Mental Status: He is alert.  Psychiatric:        Mood and Affect: Mood normal.        Behavior: Behavior normal.  UC Treatments / Results  Labs (all labs ordered are listed, but only abnormal results are displayed) Labs Reviewed - No data to display  EKG   Radiology No results found.  Procedures Procedures (including critical care time)  Medications Ordered in UC Medications - No data to display  Initial Impression / Assessment and Plan / UC Course  I have reviewed the triage vital signs and the nursing notes.  Pertinent labs & imaging results that were available during my care of the patient were reviewed by me and considered in my medical decision making (see chart for details).    64 year old male presents with sinusitis.  Treating with Augmentin.  Final Clinical Impressions(s) / UC Diagnoses   Final diagnoses:  Acute frontal sinusitis, recurrence not specified   Discharge Instructions   None    ED Prescriptions    Medication Sig Dispense Auth. Provider   amoxicillin-clavulanate (AUGMENTIN) 875-125 MG tablet Take 1 tablet by mouth 2 (two) times daily. 20 tablet Coral Spikes, DO     PDMP not reviewed this encounter.   Coral Spikes, DO 05/13/20 1002

## 2020-05-27 ENCOUNTER — Other Ambulatory Visit: Payer: Self-pay | Admitting: Family Medicine

## 2020-06-05 ENCOUNTER — Other Ambulatory Visit: Payer: Self-pay

## 2020-06-05 ENCOUNTER — Ambulatory Visit
Admission: EM | Admit: 2020-06-05 | Discharge: 2020-06-05 | Disposition: A | Discharge status: Home / Self Care | Payer: Medicare HMO | Attending: Sports Medicine | Admitting: Sports Medicine

## 2020-06-05 DIAGNOSIS — J309 Allergic rhinitis, unspecified: Secondary | ICD-10-CM | POA: Diagnosis not present

## 2020-06-05 DIAGNOSIS — Z20822 Contact with and (suspected) exposure to covid-19: Secondary | ICD-10-CM | POA: Diagnosis not present

## 2020-06-05 MED ORDER — IPRATROPIUM BROMIDE 0.06 % NA SOLN: 2.0000 | Freq: Four times a day (QID) | NASAL | 12 refills | Status: DC

## 2020-06-05 NOTE — ED Provider Notes (Signed)
[facility]    CSN: [id number] Arrival date & time: 06/05/20  1715      History   Chief Complaint Chief Complaint  Patient presents with  . sneezing  . Nasal Congestion  . Headache    HPI Jack Weaver is a 65 y.o. male.   HPI   65 year old male here for evaluation of runny nose, sneezing, frontal headache, and clear nasal discharge that started today.  Patient reports that he has his typical allergy symptoms every year at this time.  Patient states he tried taking some Claritin today but he thinks it that made it worse.  Patient denies fever, ear pain or pressure, sore throat, cough, GI complaints, shortness of breath, changes to taste or smell sense, or body aches.  Past Medical History:  Diagnosis Date  . Arthritis   . Hypertension   . Prostate cancer [facility])     Patient Active Problem List   Diagnosis Date Noted  . Palpitations 03/29/2020  . PVC's (premature ventricular contractions) 03/29/2020  . Shortness of breath 03/29/2020  . Postoperative ileus ([place]) 10/15/2018  . Right lower quadrant abdominal pain   . Prostate cancer ([place]) 10/11/2018  . Lumbar sprain 09/04/2017  . Traumatic rupture of quadriceps tendon 09/02/2017  . Bursitis of hip 05/31/2015  . ED (erectile dysfunction) of organic origin 11/18/2014  . Essential hypertension 11/18/2014  . Neuropathy 11/18/2014  . Allergic rhinitis 11/14/2014  . Arthropathia 11/26/2009  . History of tobacco use 07/31/2008  . Acid reflux 07/26/2008  . Arthritis, degenerative 07/26/2008  . Apnea, sleep 06/30/2008    Past Surgical History:  Procedure Laterality Date  . APPENDECTOMY     At age 60  . HEMORROIDECTOMY  1930  . PELVIC LYMPH NODE DISSECTION Bilateral 10/11/2018   Procedure: PELVIC LYMPH NODE DISSECTION;  Surgeon: Hollice Espy, MD;  Location: [facility] ORS;  Service: Urology;  Laterality: Bilateral;  . QUADRICEPS TENDON REPAIR Left 09/09/2017   Procedure: REPAIR QUADRICEP TENDON;  Surgeon:  Earnestine Leys, MD;  Location: [facility] ORS;  Service: Orthopedics;  Laterality: Left;  . ROBOT ASSISTED LAPAROSCOPIC RADICAL PROSTATECTOMY N/A 10/11/2018   Procedure: ROBOTIC ASSISTED LAPAROSCOPIC RADICAL PROSTATECTOMY;  Surgeon: Hollice Espy, MD;  Location: [facility] ORS;  Service: Urology;  Laterality: N/A;       Home Medications    Prior to Admission medications   Medication Sig Start Date End Date Taking? Authorizing Provider  ipratropium (ATROVENT) 0.06 % nasal spray Place 2 sprays into both nostrils 4 (four) times daily. 06/05/20  Yes Margarette Canada, NP  diltiazem (CARDIZEM CD) 240 MG 24 hr capsule Take 240 mg by mouth daily.  03/30/20   [provider]  ERLEADA 60 MG tablet Take 240 mg by mouth daily. 05/07/20   [provider]  ibuprofen (ADVIL) 200 MG tablet Take 400 mg by mouth every 8 (eight) hours as needed for mild pain.    [provider]  lisinopril (ZESTRIL) 40 MG tablet Take 1 tablet (40 mg total) by mouth daily. 04/23/20   [name], MD  meloxicam (MOBIC) 15 MG tablet Take 1 tablet (15 mg total) by mouth daily. 04/23/20   [name], MD  metoprolol succinate (TOPROL-XL) 25 MG 24 hr tablet Take 1 tablet (25 mg total) by mouth daily. 04/23/20   [name], MD  Multiple Vitamin (MULTIVITAMIN WITH MINERALS) TABS tablet Take 1 tablet by mouth daily.    [provider]  omeprazole (PRILOSEC) 40 MG capsule Take  1 capsule (40 mg total) by mouth daily as needed (acid reflux/indigestion). 04/23/20   [name], MD  fluticasone [facility]) 50 MCG/ACT nasal spray Place 1 spray into both nostrils daily. 08/02/19 06/05/20  Earleen Newport, MD    Family History Family History  Problem Relation Age of Onset  . Heart attack Mother   . Stroke Father   . Asthma Father   . Cancer Brother   . Diabetes Brother   . Prostate cancer Neg Hx   . Kidney cancer Neg Hx   . Bladder Cancer Neg Hx     Social  History Social History   Tobacco Use  . Smoking status: Former Smoker    Types: Cigars  . Smokeless tobacco: Never Used  Vaping Use  . Vaping Use: Never used  Substance Use Topics  . Alcohol use: Yes    Alcohol/week: 2.0 standard drinks    Types: 2 Cans of beer per week  . Drug use: No     Allergies   Prednisone   Review of Systems Review of Systems  Constitutional: Negative for activity change, appetite change and fever.  HENT: Positive for rhinorrhea and sneezing. Negative for congestion and sore throat.   Respiratory: Negative for cough, shortness of breath and wheezing.   Gastrointestinal: Negative for diarrhea, nausea and vomiting.  Musculoskeletal: Negative for arthralgias and myalgias.  Skin: Negative for rash.  Neurological: Positive for headaches.  Hematological: Negative.   Psychiatric/Behavioral: Negative.      Physical Exam Triage Vital Signs ED Triage Vitals  Enc Vitals Group     BP 06/05/20 1810 (!) 184/100     Pulse Rate 06/05/20 1810 (!) 50     Resp 06/05/20 1810 19     Temp 06/05/20 1810 98.3 F (36.8 C)     Temp Source 06/05/20 1810 Oral     SpO2 06/05/20 1810 100 %     Weight 06/05/20 1806 220 lb (99.8 kg)     Height 06/05/20 1806 6' (1.829 m)     Head Circumference --      Peak Flow --      Pain Score --      Pain Loc --      Pain Edu? --      Excl. in [place]? --    No data found.  Updated Vital Signs BP (!) 184/100 (BP Location: Left Arm)   Pulse (!) 50   Temp 98.3 F (36.8 C) (Oral)   Resp 19   Ht 6' (1.829 m)   Wt 220 lb (99.8 kg)   SpO2 100%   BMI 29.84 kg/m   Visual Acuity Right Eye Distance:   Left Eye Distance:   Bilateral Distance:    Right Eye Near:   Left Eye Near:    Bilateral Near:     Physical Exam Vitals and nursing note reviewed.  Constitutional:      General: He is not in acute distress.    Appearance: Normal appearance. He is well-developed and normal weight. He is not toxic-appearing.  HENT:      Head: Normocephalic and atraumatic.     Right Ear: Tympanic membrane, ear canal and external ear normal.     Left Ear: Tympanic membrane, ear canal and external ear normal.     Nose: Congestion and rhinorrhea present.     Comments: Nasal mucosa is mildly erythematous and edematous with clear nasal discharge.    Mouth/Throat:     Mouth: Mucous membranes are  moist.     Pharynx: Oropharynx is clear. No oropharyngeal exudate or posterior oropharyngeal erythema.  Cardiovascular:     Rate and Rhythm: Normal rate and regular rhythm.     Pulses: Normal pulses.     Heart sounds: Normal heart sounds. No murmur heard. No gallop.   Pulmonary:     Effort: Pulmonary effort is normal.     Breath sounds: Normal breath sounds. No wheezing, rhonchi or rales.  Musculoskeletal:     Cervical back: Normal range of motion and neck supple.  Lymphadenopathy:     Cervical: No cervical adenopathy.  Skin:    General: Skin is warm and dry.     Capillary Refill: Capillary refill takes less than 2 seconds.     Findings: No erythema or rash.  Neurological:     General: No focal deficit present.     Mental Status: He is alert and oriented to person, place, and time.  Psychiatric:        Mood and Affect: Mood normal.        Behavior: Behavior normal.        Thought Content: Thought content normal.        Judgment: Judgment normal.      UC Treatments / Results  Labs (all labs ordered are listed, but only abnormal results are displayed) Labs Reviewed  SARS CORONAVIRUS 2 (TAT 6-24 HRS)    EKG   Radiology No results found.  Procedures Procedures (including critical care time)  Medications Ordered in UC Medications - No data to display  Initial Impression / Assessment and Plan / UC Course  I have reviewed the triage vital signs and the nursing notes.  Pertinent labs & imaging results that were available during my care of the patient were reviewed by me and considered in my medical decision making  (see chart for details).   Patient is here for evaluation of possible allergy like symptoms that started today.  Patient is in no acute distress.  Physical exam does reveal inflamed and erythematous nasal mucosa with clear nasal discharge.  Remainder patient's upper respiratory exam is benign.  Lung sounds are clear to auscultation all fields.  Patient has been vaccinated against COVID.  Will swab for COVID and treat patient for rhinitis. With over-the-counter Zyrtec and Atrovent nasal spray 2 sprays every 6 hours.  Final Clinical Impressions(s) / UC Diagnoses   Final diagnoses:  Allergic rhinitis, unspecified seasonality, unspecified trigger     Discharge Instructions     Isolate at home until the results of your COVID test are back.  If they are positive then you will need to quarantine for 5 days from when your symptoms started.  After 5 days you can break quarantine if your symptoms have improved and you have not had a fever in 24 hours.  Use over-the-counter Zyrtec, Xyzal, or Allegra daily for possible allergy symptoms.  Use the Atrovent nasal spray, 2 sprays in each nostril 4 times a day as needed for runny nose.  If you develop any shortness of breath, especially shortness of breath at rest, you cannot speak in full sentences, or other respiratory concerns the ER for evaluation.    ED Prescriptions    Medication Sig Dispense Auth. Provider   ipratropium (ATROVENT) 0.06 % nasal spray Place 2 sprays into both nostrils 4 (four) times daily. 15 mL Margarette Canada, NP     PDMP not reviewed this encounter.   Margarette Canada, NP 06/05/20 1843

## 2020-06-05 NOTE — Discharge Instructions (Addendum)
Isolate at home until the results of your COVID test are back.  If they are positive then you will need to quarantine for 5 days from when your symptoms started.  After 5 days you can break quarantine if your symptoms have improved and you have not had a fever in 24 hours.  Use over-the-counter Zyrtec, Xyzal, or Allegra daily for possible allergy symptoms.  Use the Atrovent nasal spray, 2 sprays in each nostril 4 times a day as needed for runny nose.  If you develop any shortness of breath, especially shortness of breath at rest, you cannot speak in full sentences, or other respiratory concerns the ER for evaluation.

## 2020-06-05 NOTE — ED Triage Notes (Signed)
Pt with "sinus" symptoms starting today. Runny nose, lots of sneezing, frontal headache. Clear drainage from nose

## 2020-06-06 LAB — SARS CORONAVIRUS 2 (TAT 6-24 HRS): SARS Coronavirus 2: NEGATIVE

## 2020-06-13 DIAGNOSIS — M533 Sacrococcygeal disorders, not elsewhere classified: Secondary | ICD-10-CM | POA: Diagnosis not present

## 2020-06-17 DIAGNOSIS — M533 Sacrococcygeal disorders, not elsewhere classified: Secondary | ICD-10-CM | POA: Diagnosis not present

## 2020-06-18 ENCOUNTER — Other Ambulatory Visit: Payer: Self-pay | Admitting: Family Medicine

## 2020-06-18 DIAGNOSIS — I10 Essential (primary) hypertension: Secondary | ICD-10-CM

## 2020-06-21 DIAGNOSIS — S339XXA Sprain of unspecified parts of lumbar spine and pelvis, initial encounter: Secondary | ICD-10-CM | POA: Diagnosis not present

## 2020-07-02 ENCOUNTER — Telehealth: Payer: Self-pay

## 2020-07-02 NOTE — Telephone Encounter (Signed)
Benefits enrollment form faxed, received back No PA required.

## 2020-07-24 DIAGNOSIS — I10 Essential (primary) hypertension: Secondary | ICD-10-CM | POA: Diagnosis not present

## 2020-07-24 DIAGNOSIS — H2512 Age-related nuclear cataract, left eye: Secondary | ICD-10-CM | POA: Diagnosis not present

## 2020-08-02 ENCOUNTER — Encounter: Payer: Self-pay | Admitting: Ophthalmology

## 2020-08-06 ENCOUNTER — Other Ambulatory Visit
Admission: RE | Admit: 2020-08-06 | Discharge: 2020-08-06 | Disposition: A | Discharge status: Home / Self Care | Payer: Medicare HMO | Source: Ambulatory Visit | Attending: Ophthalmology | Admitting: Ophthalmology

## 2020-08-06 ENCOUNTER — Other Ambulatory Visit: Payer: Self-pay

## 2020-08-06 DIAGNOSIS — Z20822 Contact with and (suspected) exposure to covid-19: Secondary | ICD-10-CM | POA: Insufficient documentation

## 2020-08-06 DIAGNOSIS — Z01812 Encounter for preprocedural laboratory examination: Secondary | ICD-10-CM | POA: Diagnosis not present

## 2020-08-06 LAB — SARS CORONAVIRUS 2 (TAT 6-24 HRS): SARS Coronavirus 2: NEGATIVE

## 2020-08-07 NOTE — Discharge Instructions (Signed)

## 2020-08-08 ENCOUNTER — Ambulatory Visit: Payer: Medicare HMO | Admitting: Anesthesiology

## 2020-08-08 ENCOUNTER — Encounter: Payer: Self-pay | Admitting: Ophthalmology

## 2020-08-08 ENCOUNTER — Encounter
Admission: RE | Disposition: A | Discharge status: Home / Self Care | Payer: Self-pay | Source: Home / Self Care | Attending: Ophthalmology

## 2020-08-08 ENCOUNTER — Other Ambulatory Visit: Payer: Self-pay

## 2020-08-08 ENCOUNTER — Ambulatory Visit
Admission: RE | Admit: 2020-08-08 | Discharge: 2020-08-08 | Disposition: A | Discharge status: Home / Self Care | Payer: Medicare HMO | Attending: Ophthalmology | Admitting: Ophthalmology

## 2020-08-08 DIAGNOSIS — H2512 Age-related nuclear cataract, left eye: Secondary | ICD-10-CM | POA: Insufficient documentation

## 2020-08-08 DIAGNOSIS — C61 Malignant neoplasm of prostate: Secondary | ICD-10-CM | POA: Diagnosis not present

## 2020-08-08 DIAGNOSIS — H25812 Combined forms of age-related cataract, left eye: Secondary | ICD-10-CM | POA: Diagnosis not present

## 2020-08-08 DIAGNOSIS — Z79899 Other long term (current) drug therapy: Secondary | ICD-10-CM | POA: Diagnosis not present

## 2020-08-08 DIAGNOSIS — Z791 Long term (current) use of non-steroidal anti-inflammatories (NSAID): Secondary | ICD-10-CM | POA: Insufficient documentation

## 2020-08-08 DIAGNOSIS — Z87891 Personal history of nicotine dependence: Secondary | ICD-10-CM | POA: Insufficient documentation

## 2020-08-08 HISTORY — DX: Gastro-esophageal reflux disease without esophagitis: K21.9

## 2020-08-08 HISTORY — PX: CATARACT EXTRACTION W/PHACO: SHX586

## 2020-08-08 HISTORY — DX: Sleep apnea, unspecified: G47.30

## 2020-08-08 SURGERY — PHACOEMULSIFICATION, CATARACT, WITH IOL INSERTION
Anesthesia: Monitor Anesthesia Care | Site: Eye | Laterality: Left

## 2020-08-08 MED ORDER — LIDOCAINE HCL (PF) 2 % IJ SOLN
INTRAOCULAR | Status: DC | PRN
  Administered 2020-08-08: 1 mL

## 2020-08-08 MED ORDER — EPINEPHRINE PF 1 MG/ML IJ SOLN
INTRAOCULAR | Status: DC | PRN
  Administered 2020-08-08: 59 mL via OPHTHALMIC

## 2020-08-08 MED ORDER — CYCLOPENTOLATE HCL 2 % OP SOLN
1.0000 [drp] | OPHTHALMIC | Status: DC | PRN
  Administered 2020-08-08 (×3): 1 [drp] via OPHTHALMIC

## 2020-08-08 MED ORDER — NA HYALUR & NA CHOND-NA HYALUR 0.4-0.35 ML IO KIT
PACK | INTRAOCULAR | Status: DC | PRN
  Administered 2020-08-08: 1 mL via INTRAOCULAR

## 2020-08-08 MED ORDER — FENTANYL CITRATE (PF) 100 MCG/2ML IJ SOLN
INTRAMUSCULAR | Status: DC | PRN
  Administered 2020-08-08 (×2): 50 ug via INTRAVENOUS

## 2020-08-08 MED ORDER — CEFUROXIME OPHTHALMIC INJECTION 1 MG/0.1 ML
INJECTION | OPHTHALMIC | Status: DC | PRN
  Administered 2020-08-08: 0.1 mL via INTRACAMERAL

## 2020-08-08 MED ORDER — PHENYLEPHRINE HCL 10 % OP SOLN
1.0000 [drp] | OPHTHALMIC | Status: DC | PRN
  Administered 2020-08-08 (×3): 1 [drp] via OPHTHALMIC

## 2020-08-08 MED ORDER — MIDAZOLAM HCL 2 MG/2ML IJ SOLN
INTRAMUSCULAR | Status: DC | PRN
  Administered 2020-08-08: 2 mg via INTRAVENOUS

## 2020-08-08 MED ORDER — TETRACAINE HCL 0.5 % OP SOLN
1.0000 [drp] | OPHTHALMIC | Status: DC | PRN
  Administered 2020-08-08 (×3): 1 [drp] via OPHTHALMIC

## 2020-08-08 MED ORDER — BRIMONIDINE TARTRATE-TIMOLOL 0.2-0.5 % OP SOLN
OPHTHALMIC | Status: DC | PRN
  Administered 2020-08-08: 1 [drp] via OPHTHALMIC

## 2020-08-08 SURGICAL SUPPLY — 20 items
CANNULA ANT/CHMB 27G (MISCELLANEOUS) ×1 IMPLANT
CANNULA ANT/CHMB 27GA (MISCELLANEOUS) ×2 IMPLANT
GLOVE SURG TRIUMPH 8.0 PF LTX (GLOVE) ×2 IMPLANT
GOWN STRL REUS W/ TWL LRG LVL3 (GOWN DISPOSABLE) ×2 IMPLANT
GOWN STRL REUS W/TWL LRG LVL3 (GOWN DISPOSABLE) ×4
LENS IOL TECNIS EYHANCE 19.0 (Intraocular Lens) ×1 IMPLANT
MARKER SKIN DUAL TIP RULER LAB (MISCELLANEOUS) ×2 IMPLANT
NDL CAPSULORHEX 25GA (NEEDLE) ×1 IMPLANT
NDL FILTER BLUNT 18X1 1/2 (NEEDLE) ×2 IMPLANT
NEEDLE CAPSULORHEX 25GA (NEEDLE) ×2 IMPLANT
NEEDLE FILTER BLUNT 18X 1/2SAF (NEEDLE) ×2
NEEDLE FILTER BLUNT 18X1 1/2 (NEEDLE) ×2 IMPLANT
PACK CATARACT BRASINGTON (MISCELLANEOUS) ×2 IMPLANT
PACK EYE AFTER SURG (MISCELLANEOUS) ×2 IMPLANT
PACK OPTHALMIC (MISCELLANEOUS) ×2 IMPLANT
SOLUTION OPHTHALMIC SALT (MISCELLANEOUS) ×2 IMPLANT
SYR 3ML LL SCALE MARK (SYRINGE) ×4 IMPLANT
SYR TB 1ML LUER SLIP (SYRINGE) ×2 IMPLANT
WATER STERILE IRR 250ML POUR (IV SOLUTION) ×2 IMPLANT
WIPE NON LINTING 3.25X3.25 (MISCELLANEOUS) ×2 IMPLANT

## 2020-08-08 NOTE — Anesthesia Postprocedure Evaluation (Signed)
Anesthesia Post Note  Patient: Jack Weaver  Procedure(s) Performed: CATARACT EXTRACTION PHACO AND INTRAOCULAR LENS PLACEMENT (IOC) LEFT 5.62 01:00.4 903% (Left Eye)     Patient location during evaluation: PACU Anesthesia Type: MAC Level of consciousness: awake Pain management: pain level controlled Vital Signs Assessment: post-procedure vital signs reviewed and stable Respiratory status: respiratory function stable Cardiovascular status: stable Postop Assessment: no apparent nausea or vomiting Anesthetic complications: no   No complications documented.  Veda Canning

## 2020-08-08 NOTE — Anesthesia Preprocedure Evaluation (Signed)
Anesthesia Evaluation  Patient identified by MRN, date of birth, ID band Patient awake    Reviewed: Allergy & Precautions, NPO status   Airway Mallampati: II  TM Distance: >3 FB     Dental   Pulmonary sleep apnea (mild, no cpap) , former smoker,    Pulmonary exam normal        Cardiovascular hypertension,  Rhythm:Regular Rate:Normal     Neuro/Psych    GI/Hepatic GERD  ,  Endo/Other  BMI 30  Renal/GU      Musculoskeletal  (+) Arthritis ,   Abdominal   Peds  Hematology   Anesthesia Other Findings Hx prostate cancer  Reproductive/Obstetrics                             Anesthesia Physical Anesthesia Plan  ASA: II  Anesthesia Plan: MAC   Post-op Pain Management:    Induction: Intravenous  PONV Risk Score and Plan: TIVA, Midazolam and Treatment may vary due to age or medical condition  Airway Management Planned: Natural Airway and Nasal Cannula  Additional Equipment:   Intra-op Plan:   Post-operative Plan:   Informed Consent: I have reviewed the patients History and Physical, chart, labs and discussed the procedure including the risks, benefits and alternatives for the proposed anesthesia with the patient or authorized representative who has indicated his/her understanding and acceptance.       Plan Discussed with: CRNA  Anesthesia Plan Comments:         Anesthesia Quick Evaluation

## 2020-08-08 NOTE — H&P (Signed)

## 2020-08-08 NOTE — Anesthesia Procedure Notes (Signed)
Procedure Name: MAC Date/Time: 08/08/2020 11:29 AM Performed by: Dionne Bucy, CRNA Pre-anesthesia Checklist: Patient identified, Emergency Drugs available, Suction available, Patient being monitored and Timeout performed Oxygen Delivery Method: Nasal cannula Placement Confirmation: positive ETCO2

## 2020-08-08 NOTE — Op Note (Signed)
OPERATIVE NOTE  CLEVER GERALDO [id number] 08/08/2020   PREOPERATIVE DIAGNOSIS:  Nuclear sclerotic cataract left eye. H25.12   POSTOPERATIVE DIAGNOSIS:    Nuclear sclerotic cataract left eye.     PROCEDURE:  Phacoemusification with posterior chamber intraocular lens placement of the left eye  Ultrasound time: Procedure(s): CATARACT EXTRACTION PHACO AND INTRAOCULAR LENS PLACEMENT (IOC) LEFT 5.62 01:00.4 903% (Left)  LENS:   Implant Name Type Inv. Item Serial No. Manufacturer Lot No. LRB No. Used Action  LENS IOL TECNIS EYHANCE 19.0 - [id number] Intraocular Lens LENS IOL TECNIS EYHANCE 19.0 [id number] JOHNSON   Left 1 Implanted      SURGEON:  Wyonia Hough, MD   ANESTHESIA:  Topical with tetracaine drops and 2% Xylocaine jelly, augmented with 1% preservative-free intracameral lidocaine.    COMPLICATIONS:  None.   DESCRIPTION OF PROCEDURE:  The patient was identified in the holding room and transported to the operating room and placed in the supine position under the operating microscope.  The left eye was identified as the operative eye and it was prepped and draped in the usual sterile ophthalmic fashion.   A 1 millimeter clear-corneal paracentesis was made at the 1:30 position.  0.5 ml of preservative-free 1% lidocaine was injected into the anterior chamber.  The anterior chamber was filled with Viscoat viscoelastic.  A 2.4 millimeter keratome was used to make a near-clear corneal incision at the 10:30 position.  .  A curvilinear capsulorrhexis was made with a cystotome and capsulorrhexis forceps.  Balanced salt solution was used to hydrodissect and hydrodelineate the nucleus.   Phacoemulsification was then used in stop and chop fashion to remove the lens nucleus and epinucleus.  The remaining cortex was then removed using the irrigation and aspiration handpiece. Provisc was then placed into the capsular bag to distend it for lens placement.  A lens was then injected into the  capsular bag.  The remaining viscoelastic was aspirated.   Wounds were hydrated with balanced salt solution.  The anterior chamber was inflated to a physiologic pressure with balanced salt solution.  No wound leaks were noted. Cefuroxime 0.1 ml of a 10mg /ml solution was injected into the anterior chamber for a dose of 1 mg of intracameral antibiotic at the completion of the case.    Timolol and Brimonidine drops were applied to the eye.  The patient was taken to the recovery room in stable condition without complications of anesthesia or surgery.  Aerika Groll 08/08/2020, 11:50 AM

## 2020-08-08 NOTE — Transfer of Care (Signed)
Immediate Anesthesia Transfer of Care Note  Patient: Jack Weaver  Procedure(s) Performed: CATARACT EXTRACTION PHACO AND INTRAOCULAR LENS PLACEMENT (IOC) LEFT 5.62 01:00.4 903% (Left Eye)  Patient Location: PACU  Anesthesia Type: MAC  Level of Consciousness: awake, alert  and patient cooperative  Airway and Oxygen Therapy: Patient Spontanous Breathing and Patient connected to supplemental oxygen  Post-op Assessment: Post-op Vital signs reviewed, Patient's Cardiovascular Status Stable, Respiratory Function Stable, Patent Airway and No signs of Nausea or vomiting  Post-op Vital Signs: Reviewed and stable  Complications: No complications documented.

## 2020-08-08 NOTE — Progress Notes (Signed)
No risk at this time. 

## 2020-08-09 ENCOUNTER — Encounter: Payer: Self-pay | Admitting: Ophthalmology

## 2020-08-15 DIAGNOSIS — M25552 Pain in left hip: Secondary | ICD-10-CM | POA: Diagnosis not present

## 2020-08-15 DIAGNOSIS — M545 Low back pain, unspecified: Secondary | ICD-10-CM | POA: Diagnosis not present

## 2020-08-17 DIAGNOSIS — M25552 Pain in left hip: Secondary | ICD-10-CM | POA: Diagnosis not present

## 2020-08-20 DIAGNOSIS — M25552 Pain in left hip: Secondary | ICD-10-CM | POA: Diagnosis not present

## 2020-08-23 ENCOUNTER — Encounter: Payer: Self-pay | Admitting: Adult Health

## 2020-08-23 ENCOUNTER — Ambulatory Visit (INDEPENDENT_AMBULATORY_CARE_PROVIDER_SITE_OTHER): Payer: Medicare HMO | Admitting: Adult Health

## 2020-08-23 ENCOUNTER — Ambulatory Visit
Admission: RE | Admit: 2020-08-23 | Discharge: 2020-08-23 | Disposition: A | Discharge status: Home / Self Care | Payer: Medicare HMO | Source: Ambulatory Visit | Attending: Adult Health | Admitting: Adult Health

## 2020-08-23 ENCOUNTER — Other Ambulatory Visit: Payer: Self-pay

## 2020-08-23 ENCOUNTER — Telehealth: Payer: Self-pay | Admitting: Family Medicine

## 2020-08-23 VITALS — BP 146/88 | HR 58 | Temp 98.4°F | Ht 72.0 in | Wt 247.0 lb

## 2020-08-23 DIAGNOSIS — M5136 Other intervertebral disc degeneration, lumbar region: Secondary | ICD-10-CM | POA: Diagnosis not present

## 2020-08-23 DIAGNOSIS — Z1211 Encounter for screening for malignant neoplasm of colon: Secondary | ICD-10-CM

## 2020-08-23 DIAGNOSIS — M25552 Pain in left hip: Secondary | ICD-10-CM

## 2020-08-23 DIAGNOSIS — R202 Paresthesia of skin: Secondary | ICD-10-CM | POA: Insufficient documentation

## 2020-08-23 DIAGNOSIS — M51369 Other intervertebral disc degeneration, lumbar region without mention of lumbar back pain or lower extremity pain: Secondary | ICD-10-CM

## 2020-08-23 DIAGNOSIS — Z5320 Procedure and treatment not carried out because of patient's decision for unspecified reasons: Secondary | ICD-10-CM

## 2020-08-23 DIAGNOSIS — E559 Vitamin D deficiency, unspecified: Secondary | ICD-10-CM

## 2020-08-23 DIAGNOSIS — M545 Low back pain, unspecified: Secondary | ICD-10-CM | POA: Diagnosis not present

## 2020-08-23 DIAGNOSIS — M25551 Pain in right hip: Secondary | ICD-10-CM | POA: Diagnosis not present

## 2020-08-23 NOTE — Progress Notes (Signed)
Progressed and up to severe lumbar disc degeneration since 2011 at L2-L3, L4-L5. With this and left leg paresthesia will order lumbar MRI . Please verify if any implants in his body and if he is claustrophobic and I will then place order for MRI.  Hip joint spaces appear symmetric, stable on the left since 2010. Asymmetric sclerosis at the pubic symphysis appears chronic and degenerative in nature.Recommend him following back up with Dr. Marry Guan for his hip and pelvis.   Will refer him to Neurosurgeon does he have preference for location etc ?

## 2020-08-23 NOTE — Telephone Encounter (Signed)
Pt states he was going over his papers and was advised to follow back up with Dr Marry Guan.  But pt has never seen a Dr Marry Guan.    He would prefer a Chief of Staff in [place].  Pt wants to know are you going to schedule the MRI?  Please advise

## 2020-08-23 NOTE — Telephone Encounter (Signed)
Pt wife is calling back to clarify the below message Patient is fine seeing Dr. Marry Guan. However, if another doctor or a neuro surgeon is needed after seeing Dr Marry Guan- it would be preferred to see a Neurosurgeon in [place]. Also wanting to know can [facility] schedule MRI? Please advise CB- [phone] Wife is calling to ask if the referral can be put in right away the pt is in extreme pain.

## 2020-08-23 NOTE — Telephone Encounter (Signed)
Please review. Thanks!  

## 2020-08-23 NOTE — Telephone Encounter (Signed)
Please see MRI questions for patient in result note and I will place MRI.  Please place referral to [place] [facility].

## 2020-08-23 NOTE — Progress Notes (Signed)
Chronic asymmetric pubic symphysis degeneration.refer back to Dr. Marry Guan.

## 2020-08-23 NOTE — Patient Instructions (Signed)
Hip Pain The hip is the joint between the upper legs and the lower pelvis. The bones, cartilage, tendons, and muscles of your hip joint support your body and allow you to move around. Hip pain can range from a minor ache to severe pain in one or both of your hips. The pain may be felt on the inside of the hip joint near the groin, or on the outside near the buttocks and upper thigh. You may also have swelling or stiffness in your hip area. Follow these instructions at home: Managing pain, stiffness, and swelling  If directed, put ice on the painful area. To do this: ? Put ice in a plastic bag. ? Place a towel between your skin and the bag. ? Leave the ice on for 20 minutes, 2-3 times a day.  If directed, apply heat to the affected area as often as told by your health care provider. Use the heat source that your health care provider recommends, such as a moist heat pack or a heating pad. ? Place a towel between your skin and the heat source. ? Leave the heat on for 20-30 minutes. ? Remove the heat if your skin turns bright red. This is especially important if you are unable to feel pain, heat, or cold. You may have a greater risk of getting burned.      Activity  Do exercises as told by your health care provider.  Avoid activities that cause pain. General instructions  Take over-the-counter and prescription medicines only as told by your health care provider.  Keep a journal of your symptoms. Write down: ? How often you have hip pain. ? The location of your pain. ? What the pain feels like. ? What makes the pain worse.  Sleep with a pillow between your legs on your most comfortable side.  Keep all follow-up visits as told by your health care provider. This is important.   Contact a health care provider if:  You cannot put weight on your leg.  Your pain or swelling continues or gets worse after one week.  It gets harder to walk.  You have a fever. Get help right away  if:  You fall.  You have a sudden increase in pain and swelling in your hip.  Your hip is red or swollen or very tender to touch. Summary  Hip pain can range from a minor ache to severe pain in one or both of your hips.  The pain may be felt on the inside of the hip joint near the groin, or on the outside near the buttocks and upper thigh.  Avoid activities that cause pain.  Write down how often you have hip pain, the location of the pain, what makes it worse, and what it feels like. This information is not intended to replace advice given to you by your health care provider. Make sure you discuss any questions you have with your health care provider. Document Revised: 09/27/2018 Document Reviewed: 09/27/2018 Elsevier Patient Education  2021 [employer]  

## 2020-08-23 NOTE — Progress Notes (Signed)
Established patient visit   Patient: Jack Weaver   DOB: [birth year]   65 y.o. Male  MRN: [id number] Visit Date: 08/23/2020  Today's healthcare provider: Marcille Buffy, FNP   Chief Complaint  Patient presents with  . Hip Pain   Subjective    Hip Pain  The incident occurred more than 1 week ago (3 weeks ago. ). Incident location: [place]. The injury mechanism was a fall. The pain is present in the left hip. The quality of the pain is described as shooting. The pain is at a severity of 10/10. The pain is severe. The pain has been constant since onset. Associated symptoms include an inability to bear weight, muscle weakness and numbness. Pertinent negatives include no loss of motion, loss of sensation or tingling. Foreign body present: N/A. The symptoms are aggravated by movement and weight bearing. He has tried heat and NSAIDs (Muscle relaxor) for the symptoms. The treatment provided no relief.    Denies any skin changes. Has follow up with [facility] 08/10/20 for physical therapy.  Patient reports he had x ray in [place]. I am unable to see this report at this time in care everywhere.   He reports he stopped using hydrocodone [facility] provided and also Ultram as he was having constipation.  He denies any black or Tarry stools.   He has paresthesia of left leg. No erythema or warmth. No cramping and feels it starts in his left hip and goes to his ankle. This has been present for months prior to fall.  He reports he has been in significant pain for some time now that he has been to [facility].  He reports seeing Dr. Marry Guan however I cannot see the office visit at this time. Patient  denies any fever,,chills, rash, chest pain, shortness of breath, nausea, vomiting, or diarrhea.  Denies dizziness, lightheadedness, pre syncopal or syncopal episodes.      Past Medical History:  Diagnosis Date  . Arthritis   . GERD (gastroesophageal reflux disease)   .  Hypertension   . Prostate cancer ([place])   . Sleep apnea    no CPAP   Past Surgical History:  Procedure Laterality Date  . APPENDECTOMY     At age 49  . CATARACT EXTRACTION W/PHACO Left 08/08/2020   Procedure: CATARACT EXTRACTION PHACO AND INTRAOCULAR LENS PLACEMENT (IOC) LEFT 5.62 01:00.4 903%;  Surgeon: Leandrew Koyanagi, MD;  Location: [facility];  Service: Ophthalmology;  Laterality: Left;  . HEMORROIDECTOMY  1930  . PELVIC LYMPH NODE DISSECTION Bilateral 10/11/2018   Procedure: PELVIC LYMPH NODE DISSECTION;  Surgeon: Hollice Espy, MD;  Location: [facility];  Service: Urology;  Laterality: Bilateral;  . QUADRICEPS TENDON REPAIR Left 09/09/2017   Procedure: REPAIR QUADRICEP TENDON;  Surgeon: Earnestine Leys, MD;  Location: [facility];  Service: Orthopedics;  Laterality: Left;  . ROBOT ASSISTED LAPAROSCOPIC RADICAL PROSTATECTOMY N/A 10/11/2018   Procedure: ROBOTIC ASSISTED LAPAROSCOPIC RADICAL PROSTATECTOMY;  Surgeon: Hollice Espy, MD;  Location: [facility];  Service: Urology;  Laterality: N/A;   Social History   Tobacco Use  . Smoking status: Former Smoker    Types: Cigars  . Smokeless tobacco: Never Used  Vaping Use  . Vaping Use: Never used  Substance Use Topics  . Alcohol use: Yes    Alcohol/week: 2.0 standard drinks    Types: 2 Cans of beer per week    Comment: occasionally  . Drug use: No   Social History   Socioeconomic History  .  Marital status: Married    Spouse name: Not on file  . Number of children: Not on file  . Years of education: Not on file  . Highest education level: Not on file  Occupational History  . Not on file  Tobacco Use  . Smoking status: Former Smoker    Types: Cigars  . Smokeless tobacco: Never Used  Vaping Use  . Vaping Use: Never used  Substance and Sexual Activity  . Alcohol use: Yes    Alcohol/week: 2.0 standard drinks    Types: 2 Cans of beer per week    Comment: occasionally  . Drug use: No  . Sexual activity: Not on  file  Other Topics Concern  . Not on file  Social History Narrative  . Not on file   Social Determinants of Health   Financial Resource Strain: Not on file  Food Insecurity: Not on file  Transportation Needs: Not on file  Physical Activity: Not on file  Stress: Not on file  Social Connections: Not on file  Intimate Partner Violence: Not on file   Family Status  Relation Name Status  . Mother  Deceased at age 65  . Father  Deceased at age 21       from stroke  . Brother  Deceased at age 85       stomach cancer  . Brother  Alive  . Sister  Alive  . Brother  Alive  . Brother  Alive  . Brother  Alive  . Brother  Alive  . Brother  Alive  . Neg Hx  (Not Specified)   Family History  Problem Relation Age of Onset  . Heart attack Mother   . Stroke Father   . Asthma Father   . Cancer Brother   . Diabetes Brother   . Prostate cancer Neg Hx   . Kidney cancer Neg Hx   . Bladder Cancer Neg Hx    Allergies  Allergen Reactions  . Prednisone Other (See Comments)    Sweating, hot flashes       Medications: Outpatient Medications Prior to Visit  Medication Sig  . diltiazem (CARDIZEM CD) 240 MG 24 hr capsule Take 240 mg by mouth daily.   Marland Kitchen ibuprofen (ADVIL) 200 MG tablet Take 400 mg by mouth every 8 (eight) hours as needed for mild pain.  Marland Kitchen ipratropium (ATROVENT) 0.06 % nasal spray Place 2 sprays into both nostrils 4 (four) times daily.  Marland Kitchen lisinopril (ZESTRIL) 40 MG tablet Take 1 tablet (40 mg total) by mouth daily.  . meloxicam (MOBIC) 15 MG tablet Take 1 tablet (15 mg total) by mouth daily.  . metoprolol succinate (TOPROL-XL) 25 MG 24 hr tablet Take 1 tablet (25 mg total) by mouth daily.  . Multiple Vitamin (MULTIVITAMIN WITH MINERALS) TABS tablet Take 1 tablet by mouth daily.  Marland Kitchen omeprazole (PRILOSEC) 40 MG capsule Take 1 capsule (40 mg total) by mouth daily as needed (acid reflux/indigestion).  . ERLEADA 60 MG tablet Take 240 mg by mouth daily. (Patient not taking: No sig  reported)  . HYDROcodone-acetaminophen (NORCO/VICODIN) 5-325 MG tablet hydrocodone 5 mg-acetaminophen 325 mg tablet  TAKE 1 TABLET BY MOUTH EVERY 8 HOURS  . methocarbamol (ROBAXIN) 500 MG tablet methocarbamol 500 mg tablet  . [DISCONTINUED] Baclofen 5 MG TABS TAKE 1 TABLET BY ORAL ROUTE AT BEDTIME FOR 15 DAYS.   No facility-administered medications prior to visit.    Review of Systems  Neurological: Positive for numbness. Negative for tingling.  Last CBC Lab Results  Component Value Date   WBC 4.2 08/23/2020   HGB 14.2 08/23/2020   HCT 42.8 08/23/2020   MCV 83 08/23/2020   MCH 27.5 08/23/2020   RDW 13.1 08/23/2020   PLT 232 08/23/2020   Last metabolic panel Lab Results  Component Value Date   GLUCOSE 107 (H) 08/23/2020   NA 137 08/23/2020   K 4.8 08/23/2020   CL 102 08/23/2020   CO2 21 08/23/2020   BUN 20 08/23/2020   CREATININE 1.16 08/23/2020   GFRNONAA 53 (L) 03/28/2020   GFRAA 61 03/28/2020   CALCIUM 9.7 08/23/2020   PHOS 3.8 09/04/2017   PROT 6.9 08/23/2020   ALBUMIN 4.5 08/23/2020   LABGLOB 2.4 08/23/2020   AGRATIO 1.9 08/23/2020   BILITOT 0.3 08/23/2020   ALKPHOS 125 (H) 08/23/2020   AST 34 08/23/2020   ALT 26 08/23/2020   ANIONGAP 10 08/02/2019   Last lipids Lab Results  Component Value Date   CHOL 167 07/02/2016   HDL 63 07/02/2016   LDLCALC 86 07/02/2016   TRIG 90 07/02/2016   Last hemoglobin A1c Lab Results  Component Value Date   HGBA1C 5.7 (H) 09/04/2017   Last thyroid functions Lab Results  Component Value Date   TSH 1.330 03/28/2020   Last vitamin D Lab Results  Component Value Date   VD25OH 27.8 (L) 08/23/2020   Last vitamin B12 and Folate Lab Results  Component Value Date   VITAMINB12 772 07/02/2016       Objective    BP (!) 146/88 (BP Location: Right Arm, Patient Position: Sitting, Cuff Size: Large)   Pulse (!) 58   Temp 98.4 F (36.9 C) (Oral)   Ht 6' (1.829 m)   Wt 247 lb (112 kg)   SpO2 97%   BMI 33.50 kg/m   BP Readings from Last 3 Encounters:  08/24/20 138/78  08/23/20 (!) 146/88  08/08/20 (!) 168/99   Wt Readings from Last 3 Encounters:  08/24/20 214 lb (97.1 kg)  08/23/20 247 lb (112 kg)  08/08/20 224 lb 11.2 oz (101.9 kg)       Physical Exam Vitals reviewed.  Constitutional:      General: He is not in acute distress.    Appearance: Normal appearance. He is well-developed. He is not ill-appearing, toxic-appearing or diaphoretic.  HENT:     Head: Normocephalic and atraumatic.     Right Ear: Hearing, tympanic membrane, ear canal and external ear normal.     Left Ear: Hearing, tympanic membrane, ear canal and external ear normal.     Nose: Nose normal.     Mouth/Throat:     Pharynx: Uvula midline. No oropharyngeal exudate.  Eyes:     General: Lids are normal. No scleral icterus.       Right eye: No discharge.        Left eye: No discharge.     Conjunctiva/sclera: Conjunctivae normal.     Pupils: Pupils are equal, round, and reactive to light.  Neck:     Thyroid: No thyromegaly.     Vascular: Normal carotid pulses. No carotid bruit, hepatojugular reflux or JVD.     Trachea: Trachea and phonation normal. No tracheal tenderness or tracheal deviation.     Meningeal: Brudzinski's sign absent.  Cardiovascular:     Rate and Rhythm: Normal rate and regular rhythm.     Pulses: Normal pulses.     Heart sounds: Normal heart sounds, S1 normal and S2 normal. Heart sounds  not distant. No murmur heard. No friction rub. No gallop.   Pulmonary:     Effort: Pulmonary effort is normal. No accessory muscle usage or respiratory distress.     Breath sounds: Normal breath sounds. No stridor. No wheezing or rales.  Chest:     Chest wall: No tenderness.  Abdominal:     General: Bowel sounds are normal. There is no distension.     Palpations: Abdomen is soft. There is no mass.     Tenderness: There is no abdominal tenderness. There is no guarding or rebound.  Musculoskeletal:        General:  Tenderness present. No deformity.     Cervical back: Normal, full passive range of motion without pain, normal range of motion and neck supple.     Thoracic back: Normal.     Lumbar back: Spasms, tenderness and bony tenderness present. Positive right straight leg raise test and positive left straight leg raise test.       Back:     Right hip: Tenderness present.     Left hip: Tenderness present.     Right lower leg: No edema.     Left lower leg: No edema.  Lymphadenopathy:     Head:     Right side of head: No submental, submandibular, tonsillar, preauricular, posterior auricular or occipital adenopathy.     Left side of head: No submental, submandibular, tonsillar, preauricular, posterior auricular or occipital adenopathy.     Cervical: No cervical adenopathy.  Skin:    General: Skin is warm and dry.     Coloration: Skin is not pale.     Findings: No erythema or rash.     Nails: There is no clubbing.  Neurological:     Mental Status: He is alert and oriented to person, place, and time.     GCS: GCS eye subscore is 4. GCS verbal subscore is 5. GCS motor subscore is 6.     Cranial Nerves: No cranial nerve deficit.     Sensory: Sensation is intact. No sensory deficit.     Motor: Motor function is intact. No abnormal muscle tone.     Coordination: Coordination is intact. Coordination normal.     Gait: Gait is intact.     Deep Tendon Reflexes: Reflexes normal.     Reflex Scores:      Tricep reflexes are 2+ on the right side and 2+ on the left side.      Patellar reflexes are 1+ on the right side and 1+ on the left side.    Comments: Uses cane   Psychiatric:        Speech: Speech normal.        Behavior: Behavior normal.        Thought Content: Thought content normal.        Judgment: Judgment normal.      Results for orders placed or performed in visit on 08/23/20  CBC with Differential/Platelet  Result Value Ref Range   WBC 4.2 3.4 - 10.8 x10E3/uL   RBC 5.17 4.14 - 5.80  x10E6/uL   Hemoglobin 14.2 13.0 - 17.7 g/dL   Hematocrit 42.8 37.5 - 51.0 %   MCV 83 79 - 97 fL   MCH 27.5 26.6 - 33.0 pg   MCHC 33.2 31.5 - 35.7 g/dL   RDW 13.1 11.6 - 15.4 %   Platelets 232 150 - 450 x10E3/uL   Neutrophils 56 Not Estab. %   Lymphs 34 Not Estab. %  Monocytes 7 Not Estab. %   Eos 1 Not Estab. %   Basos 1 Not Estab. %   Neutrophils Absolute 2.4 1.4 - 7.0 x10E3/uL   Lymphocytes Absolute 1.4 0.7 - 3.1 x10E3/uL   Monocytes Absolute 0.3 0.1 - 0.9 x10E3/uL   EOS (ABSOLUTE) 0.1 0.0 - 0.4 x10E3/uL   Basophils Absolute 0.0 0.0 - 0.2 x10E3/uL   Immature Granulocytes 1 Not Estab. %   Immature Grans (Abs) 0.0 0.0 - 0.1 x10E3/uL  Comprehensive metabolic panel  Result Value Ref Range   Glucose 107 (H) 65 - 99 mg/dL   BUN 20 8 - 27 mg/dL   Creatinine, Ser 1.16 0.76 - 1.27 mg/dL   eGFR 69 >59 mL/min/1.73   BUN/Creatinine Ratio 17 10 - 24   Sodium 137 134 - 144 mmol/L   Potassium 4.8 3.5 - 5.2 mmol/L   Chloride 102 96 - 106 mmol/L   CO2 21 20 - 29 mmol/L   Calcium 9.7 8.6 - 10.2 mg/dL   Total Protein 6.9 6.0 - 8.5 g/dL   Albumin 4.5 3.8 - 4.8 g/dL   Globulin, Total 2.4 1.5 - 4.5 g/dL   Albumin/Globulin Ratio 1.9 1.2 - 2.2   Bilirubin Total 0.3 0.0 - 1.2 mg/dL   Alkaline Phosphatase 125 (H) 44 - 121 IU/L   AST 34 0 - 40 IU/L   ALT 26 0 - 44 IU/L  PSA  Result Value Ref Range   Prostate Specific Ag, Serum <0.1 0.0 - 4.0 ng/mL  VITAMIN D 25 Hydroxy (Vit-D Deficiency, Fractures)  Result Value Ref Range   Vit D, 25-Hydroxy 27.8 (L) 30.0 - 100.0 ng/mL    Assessment & Plan     Left hip pain - Plan: CBC with Differential/Platelet, Comprehensive metabolic panel, PSA, DG Hip Unilat W OR W/O Pelvis 2-3 Views Right, DG Lumbar Spine Complete, DG Hip Unilat W OR W/O Pelvis 2-3 Views Left, MR Lumbar Spine Wo Contrast, Ambulatory referral to Neurosurgery, CANCELED: DG Hip Unilat W OR W/O Pelvis Min 4 Views Left, CANCELED: DG Hip Unilat W OR W/O Pelvis 2-3 Views Right, CANCELED: DG  Lumbar Spine Complete, CANCELED: DG Hip Unilat W OR W/O Pelvis Min 4 Views Left  Paresthesia - Plan: DG Hip Unilat W OR W/O Pelvis 2-3 Views Right, DG Lumbar Spine Complete, DG Hip Unilat W OR W/O Pelvis 2-3 Views Left, MR Lumbar Spine Wo Contrast, Ambulatory referral to Neurosurgery, CANCELED: DG Hip Unilat W OR W/O Pelvis Min 4 Views Left, CANCELED: DG Hip Unilat W OR W/O Pelvis 2-3 Views Right, CANCELED: DG Lumbar Spine Complete, CANCELED: DG Hip Unilat W OR W/O Pelvis Min 4 Views Left  Vitamin D deficiency - Plan: VITAMIN D 25 Hydroxy (Vit-D Deficiency, Fractures), MR Lumbar Spine Wo Contrast  Colon cancer screening - Plan: Cologuard, MR Lumbar Spine Wo Contrast  Colonoscopy refused - Plan: Cologuard, MR Lumbar Spine Wo Contrast  Lumbar degenerative disc disease- severe disc disease on lumbare spine.  - Plan: MR Lumbar Spine Wo Contrast, Ambulatory referral to Neurosurgery  X rays today. X rays are abnormal. Placed MRI order and refferal to neurosurgery for evaluation given paresthesia.  Allergy listed to prednisone he prefers to hold off on that. Denies any pain medicine just wants to know what is wrong.   Orders Placed This Encounter  Procedures  . DG Hip Unilat W OR W/O Pelvis 2-3 Views Right  . DG Lumbar Spine Complete  . DG Hip Unilat W OR W/O Pelvis 2-3 Views Left  .  MR Lumbar Spine Wo Contrast  . CBC with Differential/Platelet  . Comprehensive metabolic panel  . PSA  . VITAMIN D 25 Hydroxy (Vit-D Deficiency, Fractures)  . Cologuard  . Ambulatory referral to Neurosurgery  follow back up with Dr. Marry Guan for hip/ pelvis advised.  MRI back ordered.     Return in about 2 weeks (around 09/06/2020), or if symptoms worsen or fail to improve, for Go to Emergency room/ urgent care if worse, at any time for any worsening symptoms.     The entirety of the information documented in the History of Present Illness, Review of Systems and Physical Exam were personally obtained by me.  Portions of this information were initially documented by the CMA and reviewed by me for thoroughness and accuracy.     Marcille Buffy, [place] [phone] (phone) [phone] (fax)  [place]

## 2020-08-24 ENCOUNTER — Emergency Department
Admission: EM | Admit: 2020-08-24 | Discharge: 2020-08-24 | Disposition: A | Discharge status: Home / Self Care | Payer: Medicare HMO | Attending: Emergency Medicine | Admitting: Emergency Medicine

## 2020-08-24 ENCOUNTER — Encounter: Payer: Self-pay | Admitting: Emergency Medicine

## 2020-08-24 ENCOUNTER — Other Ambulatory Visit: Payer: Self-pay

## 2020-08-24 DIAGNOSIS — I10 Essential (primary) hypertension: Secondary | ICD-10-CM | POA: Diagnosis not present

## 2020-08-24 DIAGNOSIS — Z79899 Other long term (current) drug therapy: Secondary | ICD-10-CM | POA: Diagnosis not present

## 2020-08-24 DIAGNOSIS — Z87891 Personal history of nicotine dependence: Secondary | ICD-10-CM | POA: Diagnosis not present

## 2020-08-24 DIAGNOSIS — M5442 Lumbago with sciatica, left side: Secondary | ICD-10-CM | POA: Diagnosis not present

## 2020-08-24 DIAGNOSIS — Z8546 Personal history of malignant neoplasm of prostate: Secondary | ICD-10-CM | POA: Diagnosis not present

## 2020-08-24 DIAGNOSIS — M545 Low back pain, unspecified: Secondary | ICD-10-CM | POA: Diagnosis present

## 2020-08-24 LAB — COMPREHENSIVE METABOLIC PANEL
ALT: 26 IU/L (ref 0–44)
AST: 34 IU/L (ref 0–40)
Albumin/Globulin Ratio: 1.9 (ref 1.2–2.2)
Albumin: 4.5 g/dL (ref 3.8–4.8)
Alkaline Phosphatase: 125 IU/L — ABNORMAL HIGH (ref 44–121)
BUN/Creatinine Ratio: 17 (ref 10–24)
BUN: 20 mg/dL (ref 8–27)
Bilirubin Total: 0.3 mg/dL (ref 0.0–1.2)
CO2: 21 mmol/L (ref 20–29)
Calcium: 9.7 mg/dL (ref 8.6–10.2)
Chloride: 102 mmol/L (ref 96–106)
Creatinine, Ser: 1.16 mg/dL (ref 0.76–1.27)
Globulin, Total: 2.4 g/dL (ref 1.5–4.5)
Glucose: 107 mg/dL — ABNORMAL HIGH (ref 65–99)
Potassium: 4.8 mmol/L (ref 3.5–5.2)
Sodium: 137 mmol/L (ref 134–144)
Total Protein: 6.9 g/dL (ref 6.0–8.5)
eGFR: 69 mL/min/{1.73_m2} (ref >59)

## 2020-08-24 LAB — CBC WITH DIFFERENTIAL/PLATELET
Basophils Absolute: 0 10*3/uL (ref 0.0–0.2)
Basos: 1 %
EOS (ABSOLUTE): 0.1 10*3/uL (ref 0.0–0.4)
Eos: 1 %
Hematocrit: 42.8 % (ref 37.5–51.0)
Hemoglobin: 14.2 g/dL (ref 13.0–17.7)
Immature Grans (Abs): 0 10*3/uL (ref 0.0–0.1)
Immature Granulocytes: 1 %
Lymphocytes Absolute: 1.4 10*3/uL (ref 0.7–3.1)
Lymphs: 34 %
MCH: 27.5 pg (ref 26.6–33.0)
MCHC: 33.2 g/dL (ref 31.5–35.7)
MCV: 83 fL (ref 79–97)
Monocytes Absolute: 0.3 10*3/uL (ref 0.1–0.9)
Monocytes: 7 %
Neutrophils Absolute: 2.4 10*3/uL (ref 1.4–7.0)
Neutrophils: 56 %
Platelets: 232 10*3/uL (ref 150–450)
RBC: 5.17 x10E6/uL (ref 4.14–5.80)
RDW: 13.1 % (ref 11.6–15.4)
WBC: 4.2 10*3/uL (ref 3.4–10.8)

## 2020-08-24 LAB — PSA: Prostate Specific Ag, Serum: <0.1 ng/mL (ref 0.0–4.0)

## 2020-08-24 LAB — VITAMIN D 25 HYDROXY (VIT D DEFICIENCY, FRACTURES): Vit D, 25-Hydroxy: 27.8 ng/mL — ABNORMAL LOW (ref 30.0–100.0)

## 2020-08-24 MED ORDER — DEXAMETHASONE SODIUM PHOSPHATE 10 MG/ML IJ SOLN
10.0000 mg | Freq: Once | INTRAMUSCULAR | Status: AC
  Administered 2020-08-24: 10 mg via INTRAMUSCULAR
  Filled 2020-08-24: qty 1

## 2020-08-24 MED ORDER — PREDNISONE 10 MG (21) PO TBPK: ORAL_TABLET | ORAL | 0 refills | Status: DC

## 2020-08-24 NOTE — ED Provider Notes (Signed)
[facility] Emergency Department Provider Note  ____________________________________________   Event Date/Time   First MD Initiated Contact with Patient 08/24/20 [phone]     (approximate)  I have reviewed the triage vital signs and the nursing notes.   HISTORY  Chief Complaint Back Pain   HPI Jack Weaver is a 65 y.o. male presents to the ED with complaint of low back pain which radiates down into his left leg.  Patient states this is been happening for approximately 3 weeks.  Currently is taking a muscle relaxant, meloxicam and hydrocodone which has given him no relief.  Patient was seen by his PCP yesterday and x-rays were taken of his left hip and lumbar spine.  He rates his pain as a 10/10.         Past Medical History:  Diagnosis Date  . Arthritis   . GERD (gastroesophageal reflux disease)   . Hypertension   . Prostate cancer ([place])   . Sleep apnea    no CPAP    Patient Active Problem List   Diagnosis Date Noted  . Palpitations 03/29/2020  . PVC's (premature ventricular contractions) 03/29/2020  . Shortness of breath 03/29/2020  . Postoperative ileus ([place]) 10/15/2018  . Right lower quadrant abdominal pain   . Prostate cancer ([place]) 10/11/2018  . Lumbar sprain 09/04/2017  . Traumatic rupture of quadriceps tendon 09/02/2017  . Bursitis of hip 05/31/2015  . ED (erectile dysfunction) of organic origin 11/18/2014  . Essential hypertension 11/18/2014  . Neuropathy 11/18/2014  . Allergic rhinitis 11/14/2014  . Arthropathia 11/26/2009  . History of tobacco use 07/31/2008  . Acid reflux 07/26/2008  . Arthritis, degenerative 07/26/2008  . Apnea, sleep 06/30/2008    Past Surgical History:  Procedure Laterality Date  . APPENDECTOMY     At age 46  . CATARACT EXTRACTION W/PHACO Left 08/08/2020   Procedure: CATARACT EXTRACTION PHACO AND INTRAOCULAR LENS PLACEMENT (IOC) LEFT 5.62 01:00.4 903%;  Surgeon: Leandrew Koyanagi, MD;  Location:  [place];  Service: Ophthalmology;  Laterality: Left;  . HEMORROIDECTOMY  1930  . PELVIC LYMPH NODE DISSECTION Bilateral 10/11/2018   Procedure: PELVIC LYMPH NODE DISSECTION;  Surgeon: Hollice Espy, MD;  Location: [facility];  Service: Urology;  Laterality: Bilateral;  . QUADRICEPS TENDON REPAIR Left 09/09/2017   Procedure: REPAIR QUADRICEP TENDON;  Surgeon: Earnestine Leys, MD;  Location: [facility];  Service: Orthopedics;  Laterality: Left;  . ROBOT ASSISTED LAPAROSCOPIC RADICAL PROSTATECTOMY N/A 10/11/2018   Procedure: ROBOTIC ASSISTED LAPAROSCOPIC RADICAL PROSTATECTOMY;  Surgeon: Hollice Espy, MD;  Location: [facility];  Service: Urology;  Laterality: N/A;    Prior to Admission medications   Medication Sig Start Date End Date Taking? Authorizing Provider  predniSONE (STERAPRED UNI-PAK 21 TAB) 10 MG (21) TBPK tablet Take as directed. 08/24/20  Yes Johnn Hai, PA-C  diltiazem (CARDIZEM CD) 240 MG 24 hr capsule Take 240 mg by mouth daily.  03/30/20   [provider]  ERLEADA 60 MG tablet Take 240 mg by mouth daily. Patient not taking: No sig reported 05/07/20   [provider]  HYDROcodone-acetaminophen (NORCO/VICODIN) 5-325 MG tablet hydrocodone 5 mg-acetaminophen 325 mg tablet  TAKE 1 TABLET BY MOUTH EVERY 8 HOURS    [provider]  ibuprofen (ADVIL) 200 MG tablet Take 400 mg by mouth every 8 (eight) hours as needed for mild pain.    [provider]  ipratropium (ATROVENT) 0.06 % nasal spray Place 2 sprays into both nostrils 4 (four) times  daily. 06/05/20   Margarette Canada, NP  lisinopril (ZESTRIL) 40 MG tablet Take 1 tablet (40 mg total) by mouth daily. 04/23/20   [name], MD  meloxicam (MOBIC) 15 MG tablet Take 1 tablet (15 mg total) by mouth daily. 04/23/20   [name], MD  methocarbamol (ROBAXIN) 500 MG tablet methocarbamol 500 mg tablet    [provider]  metoprolol succinate (TOPROL-XL) 25 MG 24 hr  tablet Take 1 tablet (25 mg total) by mouth daily. 04/23/20   [name], MD  Multiple Vitamin (MULTIVITAMIN WITH MINERALS) TABS tablet Take 1 tablet by mouth daily.    [provider]  omeprazole (PRILOSEC) 40 MG capsule Take 1 capsule (40 mg total) by mouth daily as needed (acid reflux/indigestion). 04/23/20   [name], MD  fluticasone [facility]) 50 MCG/ACT nasal spray Place 1 spray into both nostrils daily. 08/02/19 06/05/20  Earleen Newport, MD    Allergies Prednisone  Family History  Problem Relation Age of Onset  . Heart attack Mother   . Stroke Father   . Asthma Father   . Cancer Brother   . Diabetes Brother   . Prostate cancer Neg Hx   . Kidney cancer Neg Hx   . Bladder Cancer Neg Hx     Social History Social History   Tobacco Use  . Smoking status: Former Smoker    Types: Cigars  . Smokeless tobacco: Never Used  Vaping Use  . Vaping Use: Never used  Substance Use Topics  . Alcohol use: Yes    Alcohol/week: 2.0 standard drinks    Types: 2 Cans of beer per week    Comment: occasionally  . Drug use: No    Review of Systems Constitutional: No fever/chills Eyes: No visual changes. Cardiovascular: Denies chest pain. Respiratory: Denies shortness of breath. Gastrointestinal: No abdominal pain.  No nausea, no vomiting.  No diarrhea.   Genitourinary: Negative for dysuria. Musculoskeletal: Positive for low back pain with left leg radiculopathy. Skin: Negative for rash. Neurological: Negative for headaches, focal weakness or numbness.  ____________________________________________   PHYSICAL EXAM:  VITAL SIGNS: ED Triage Vitals  Enc Vitals Group     BP 08/24/20 0751 138/78     Pulse Rate 08/24/20 0751 78     Resp 08/24/20 0751 18     Temp 08/24/20 0751 98.7 F (37.1 C)     Temp Source 08/24/20 0751 Oral     SpO2 08/24/20 0751 98 %     Weight 08/24/20 0741 214 lb (97.1 kg)     Height 08/24/20 0741 6' (1.829 m)     Head  Circumference --      Peak Flow --      Pain Score 08/24/20 0741 10     Pain Loc --      Pain Edu? --      Excl. in [place]? --    Constitutional: Alert and oriented. Well appearing and in no acute distress. Eyes: Conjunctivae are normal. PERRL. EOMI. Head: Atraumatic. Nose: No congestion/rhinnorhea. Mouth/Throat: Mucous membranes are moist.  Oropharynx non-erythematous. Neck: No stridor.   Cardiovascular: Normal rate, regular rhythm. Grossly normal heart sounds.  Good peripheral circulation and specifically the lower extremities bilaterally.Marland Kitchen Respiratory: Normal respiratory effort.  No retractions. Lungs CTAB. Gastrointestinal: Soft and nontender. No distention. Musculoskeletal: On examination of the back there is no gross deformity however there is moderate tenderness on palpation of the lower lumbar/sacral area especially to the left  SI joint area and surrounding tissue.  No point tenderness is noted to the thoracic spine.  Good muscle strength bilaterally 5/5.  Straight leg raises were approximately 45 degrees in the right leg and 45 degrees in the left leg with increased pain in the left SI joint area.  Patient is able to ambulate with the use of a cane. Neurologic:  Normal speech and language. No gross focal neurologic deficits are appreciated.  Skin:  Skin is warm, dry and intact. No rash noted. Psychiatric: Mood and affect are normal. Speech and behavior are normal.  ____________________________________________   LABS (all labs ordered are listed, but only abnormal results are displayed)  Labs Reviewed - No data to display ____________________________________________ ____________________________________________  RADIOLOGY Leana Gamer, personally viewed and evaluated these images (plain radiographs) as part of my medical decision making, as well as reviewing the written report by the radiologist.  I reviewed x-rays that was ordered by [facility] family practice yesterday by  report on chart review.  Patient has severe degenerative disc disease L2-L3, L4-L5.  X-ray of the left hip was negative for any acute changes.   ____________________________________________   PROCEDURES  Procedure(s) performed (including Critical Care):  Procedures   ____________________________________________   INITIAL IMPRESSION / ASSESSMENT AND PLAN / ED COURSE  As part of my medical decision making, I reviewed the following data within the electronic MEDICAL RECORD NUMBER Notes from prior ED visits and [place] Controlled Substance Database  65 year old male presents to the ED with complaint of low back pain with left leg radiculopathy which is not been improved with hydrocodone, meloxicam and a muscle relaxant.  X-rays were reviewed from yesterday's visit with his PCP.  Patient does have severe degenerative disc disease in his lower lumbar region.  Physical exam is consistent with left leg sciatica with low back pain.  Patient was asked about his allergies prednisone.  He states that he was instructed to take all 5 tablets at 1 time and he did a lot of sweating.  He denies any shortness of breath, difficulty breathing, difficulty swallowing or any rashes.  He states that he did not go to the emergency department and that the "hot flash" got much better.  Patient thought that this was an allergic reaction rather than an adverse reaction.  He states he has taken the pill pack without any difficulties.  An injection of Decadron was given to him IM while in the ED with no side effects or adverse reactions.  Patient is continue with the Sterapred pack.  He will follow-up with his PCP or [facility] if any continued problems.  Patient was ambulatory with a cane at the time of his discharge.  ____________________________________________   FINAL CLINICAL IMPRESSION(S) / ED DIAGNOSES  Final diagnoses:  Acute left-sided low back pain with left-sided sciatica     ED Discharge Orders         Ordered     predniSONE (STERAPRED UNI-PAK 21 TAB) 10 MG (21) TBPK tablet       Note to Pharmacy: Patient prefers the pre-pack tablets   08/24/20 0846          *Please note:  Jack Weaver was evaluated in Emergency Department on 08/24/2020 for the symptoms described in the history of present illness. He was evaluated in the context of the global COVID-19 pandemic, which necessitated consideration that the patient might be at risk for infection with the SARS-CoV-2 virus that causes COVID-19. Institutional protocols and algorithms that pertain to  the evaluation of patients at risk for COVID-19 are in a state of rapid change based on information released by regulatory bodies including the CDC and federal and state organizations. These policies and algorithms were followed during the patient's care in the ED.  Some ED evaluations and interventions may be delayed as a result of limited staffing during and the pandemic.*   Note:  This document was prepared using Dragon voice recognition software and may include unintentional dictation errors.    Johnn Hai, PA-C 08/24/20 0912    Harvest Dark, MD 08/24/20 [phone]

## 2020-08-24 NOTE — ED Notes (Addendum)
See triage note  Presents with lower back pain which moves into left leg  States he has had this pain.for about 3 weeks  States he has seen his PCP and [facility] for same   States having no relief

## 2020-08-24 NOTE — Discharge Instructions (Signed)
Follow-up with your primary care provider or [facility] for any continued problems with your back and sciatica.  Begin taking the prednisone tablets as directed.  You may continue taking the muscle relaxant with this as needed.  Use ice or heat to your back as needed for discomfort.  Also placing 2 pillows under your knees when you are lying in the bed will also help take pressure off your back.  If you sleep on your side put one pillow between your knees.

## 2020-08-24 NOTE — Progress Notes (Addendum)
Labs stable. ALT is mildly elevated. Ok to monitor with PCP. Vitamin D low , advise to take Vitamin D 2,000 international units by mouth daily.  Please see x ray results and call patient with MRI screening questions so MRI order can be placed.

## 2020-08-24 NOTE — ED Triage Notes (Signed)
Pt comes into the ED via POV c/o left lower back pain that radiates through the buttock and down the leg.  Pt states he has been having problems with this for 3 weeks and has seen his PCP.  Pt states that he has been given muscle relaxers and pain medication with no success.  Pt ambulatory to triage at this time.

## 2020-08-26 ENCOUNTER — Other Ambulatory Visit: Payer: Self-pay | Admitting: Internal Medicine

## 2020-08-26 DIAGNOSIS — I10 Essential (primary) hypertension: Secondary | ICD-10-CM

## 2020-08-27 ENCOUNTER — Telehealth: Payer: Self-pay

## 2020-08-27 NOTE — Telephone Encounter (Signed)
Copied from [place] [phone]. Topic: Appointment Scheduling - Scheduling Inquiry for Clinic >> Aug 27, 2020 11:47 AM Erick Blinks wrote: Pt would like to have an MRI scheduled at the [facility] with Dr. Louis Meckel  Best contact: [phone]

## 2020-08-27 NOTE — Telephone Encounter (Signed)
Referral and MRI have been ordered already. Sent message to referrals to change referral to [facility].

## 2020-08-27 NOTE — Telephone Encounter (Signed)
Please advise If ok to refill Metoprolol Succ. 25 mg 1 qd. Last filled by PCP.

## 2020-08-28 ENCOUNTER — Other Ambulatory Visit: Payer: Self-pay

## 2020-08-28 ENCOUNTER — Ambulatory Visit
Admission: RE | Admit: 2020-08-28 | Discharge: 2020-08-28 | Disposition: A | Discharge status: Home / Self Care | Payer: Medicare HMO | Source: Ambulatory Visit | Attending: Adult Health | Admitting: Adult Health

## 2020-08-28 DIAGNOSIS — Z1211 Encounter for screening for malignant neoplasm of colon: Secondary | ICD-10-CM | POA: Diagnosis not present

## 2020-08-28 DIAGNOSIS — R202 Paresthesia of skin: Secondary | ICD-10-CM | POA: Diagnosis not present

## 2020-08-28 DIAGNOSIS — Z5320 Procedure and treatment not carried out because of patient's decision for unspecified reasons: Secondary | ICD-10-CM | POA: Diagnosis not present

## 2020-08-28 DIAGNOSIS — M25552 Pain in left hip: Secondary | ICD-10-CM | POA: Diagnosis not present

## 2020-08-28 DIAGNOSIS — C61 Malignant neoplasm of prostate: Secondary | ICD-10-CM

## 2020-08-28 DIAGNOSIS — M5136 Other intervertebral disc degeneration, lumbar region: Secondary | ICD-10-CM | POA: Diagnosis not present

## 2020-08-28 DIAGNOSIS — E559 Vitamin D deficiency, unspecified: Secondary | ICD-10-CM | POA: Insufficient documentation

## 2020-08-28 DIAGNOSIS — M545 Low back pain, unspecified: Secondary | ICD-10-CM | POA: Diagnosis not present

## 2020-08-28 NOTE — Telephone Encounter (Signed)
Done

## 2020-08-29 ENCOUNTER — Encounter: Payer: Self-pay | Admitting: Family Medicine

## 2020-08-29 ENCOUNTER — Encounter: Payer: Self-pay | Admitting: Adult Health

## 2020-08-29 DIAGNOSIS — I714 Abdominal aortic aneurysm, without rupture, unspecified: Secondary | ICD-10-CM | POA: Insufficient documentation

## 2020-08-30 DIAGNOSIS — M25552 Pain in left hip: Secondary | ICD-10-CM | POA: Diagnosis not present

## 2020-08-30 NOTE — Progress Notes (Signed)
Vitamin d low, take over the counter Vitamin D 3 at 2,ooo interntaional units once by mouth dauoly.   Recheck in 3 moths Vitamin D level.

## 2020-08-31 DIAGNOSIS — M5416 Radiculopathy, lumbar region: Secondary | ICD-10-CM | POA: Diagnosis not present

## 2020-09-04 ENCOUNTER — Telehealth: Payer: Self-pay

## 2020-09-04 ENCOUNTER — Encounter: Payer: Self-pay | Admitting: *Deleted

## 2020-09-04 NOTE — Telephone Encounter (Signed)
Copied from [place] [id number]. Topic: General - Other >> Sep 04, 2020  8:56 AM Keene Breath wrote: Reason for CRM: Patient stated he is returning a call to the office.  He is not sure why he was called.  Please advise and call patient back.  CB# [phone]

## 2020-09-05 DIAGNOSIS — M5416 Radiculopathy, lumbar region: Secondary | ICD-10-CM | POA: Diagnosis not present

## 2020-09-06 ENCOUNTER — Ambulatory Visit: Payer: Self-pay | Admitting: Family Medicine

## 2020-09-08 DIAGNOSIS — M25552 Pain in left hip: Secondary | ICD-10-CM | POA: Diagnosis not present

## 2020-09-08 DIAGNOSIS — M79605 Pain in left leg: Secondary | ICD-10-CM | POA: Diagnosis not present

## 2020-09-10 ENCOUNTER — Other Ambulatory Visit: Payer: Medicare HMO

## 2020-09-10 ENCOUNTER — Other Ambulatory Visit: Payer: Self-pay

## 2020-09-10 DIAGNOSIS — C61 Malignant neoplasm of prostate: Secondary | ICD-10-CM

## 2020-09-11 LAB — PSA: Prostate Specific Ag, Serum: <0.1 ng/mL (ref 0.0–4.0)

## 2020-09-13 ENCOUNTER — Other Ambulatory Visit: Payer: Self-pay

## 2020-09-13 ENCOUNTER — Encounter: Payer: Self-pay | Admitting: Urology

## 2020-09-13 ENCOUNTER — Ambulatory Visit: Payer: Medicare HMO | Admitting: Urology

## 2020-09-13 VITALS — BP 143/95 | HR 63 | Ht 72.0 in | Wt 220.0 lb

## 2020-09-13 DIAGNOSIS — C61 Malignant neoplasm of prostate: Secondary | ICD-10-CM

## 2020-09-13 MED ORDER — LEUPROLIDE ACETATE (6 MONTH) 45 MG ~~LOC~~ KIT
45.0000 mg | PACK | Freq: Once | SUBCUTANEOUS | Status: AC
  Administered 2020-09-13: 45 mg via SUBCUTANEOUS

## 2020-09-13 NOTE — Progress Notes (Signed)
Eligard SubQ Injection   Due to Prostate Cancer patient is present today for a Eligard Injection.  Medication: Eligard 6 month Dose: 45 mg  Location: right  Lot: [id number] Exp: 12/2021  Patient tolerated well, no complications were noted  Performed by: Kerman Passey, RMA  Per Dr. [name]  patient is to continue therapy for 6 months. This appointment was scheduled using wheel and given to patient today along with reminder continue on Vitamin D 800-1000iu and Calium 1000-1200mg  daily while on Androgen Deprivation Therapy.  PA approval dates:

## 2020-09-13 NOTE — Progress Notes (Signed)
09/13/2020 9:21 AM   Jack Weaver 01/08/1956 [id number]  Referring provider: [name], MD [address] [place],  [place] [address]  Chief Complaint  Patient presents with  . Prostate Cancer    HPI: Jack Weaver is a 65 y.o. male who returns for a 4 month follow up of prostate cancer, stress incontinence and Eligard.   He underwent radical prostatectomy non-nerve sparing with bilateral pelvic lymph node dissection on 10/01/2018.Surgical pathology was consistent with Gleason 3+5 disease, pT3a N1.He was noted to have extraprostatic extension with positive posterior margins both left and right. He is also noted to have 2 of 3 lymph nodes on the left with focal metastatic disease. 0 of 3 lymph nodes on the right. No bladder neck involvement. No seminal vesicle involvement.  Postoperative PSA from 11/08/2018 0.4.Staging with [employer] PET scan was negative for metastatic disease. He was subsequently started on Lupron.Decline whole pelvic radiation.  Patient last injection of Eligard 6 month 45 mg was on 02/2020.   He briefly tried apalutamide as additional adjuvant treatment to his ADT.  He took a week of the highest dose but developed hypertension which he attributed to this medication.  He then took a once daily dose rather than 4 tablets/day for an entire month.  During the month, he lost hair, had worsening hot flashes and in general did not tolerate the medication.  He is not interested in trying it again.  PSA is undetectable as of 08/2020.  PSA did rise to 0.7 on ADT in 2021 however, testosterone indicated that he did not achieve castrate levels and a second dose of ADT was administered with return of his PSA to undetectable levels.  He has been struggling with sciatica.  He has full urinary control this point time.  He occasionally has an episode of urge incontinence.  He is satisfied with his urinary symptoms.  He is bothered by his hot flashes.   He wants to try to get off of ADT.  He is now open to considering whole pelvic radiation.   PMH: Past Medical History:  Diagnosis Date  . Arthritis   . GERD (gastroesophageal reflux disease)   . Hypertension   . Prostate cancer ([place])   . Sleep apnea    no CPAP    Surgical History: Past Surgical History:  Procedure Laterality Date  . APPENDECTOMY     At age 89  . CATARACT EXTRACTION W/PHACO Left 08/08/2020   Procedure: CATARACT EXTRACTION PHACO AND INTRAOCULAR LENS PLACEMENT (IOC) LEFT 5.62 01:00.4 903%;  Surgeon: Leandrew Koyanagi, MD;  Location: [place];  Service: Ophthalmology;  Laterality: Left;  . HEMORROIDECTOMY  1930  . PELVIC LYMPH NODE DISSECTION Bilateral 10/11/2018   Procedure: PELVIC LYMPH NODE DISSECTION;  Surgeon: Hollice Espy, MD;  Location: [facility];  Service: Urology;  Laterality: Bilateral;  . QUADRICEPS TENDON REPAIR Left 09/09/2017   Procedure: REPAIR QUADRICEP TENDON;  Surgeon: Earnestine Leys, MD;  Location: [facility];  Service: Orthopedics;  Laterality: Left;  . ROBOT ASSISTED LAPAROSCOPIC RADICAL PROSTATECTOMY N/A 10/11/2018   Procedure: ROBOTIC ASSISTED LAPAROSCOPIC RADICAL PROSTATECTOMY;  Surgeon: Hollice Espy, MD;  Location: [facility];  Service: Urology;  Laterality: N/A;    Home Medications:  Allergies as of 09/13/2020      Reactions   Prednisone Other (See Comments)   Sweating, hot flashes      Medication List       Accurate as of September 13, 2020  9:21 AM. If  you have any questions, ask your nurse or doctor.        STOP taking these medications   predniSONE 10 MG (21) Tbpk tablet Commonly known as: STERAPRED UNI-PAK 21 TAB Stopped by: Hollice Espy, MD     TAKE these medications   ALPRAZolam 0.5 MG tablet Commonly known as: XANAX alprazolam 0.5 mg tablet  TAKE 1- 2 TABS BY MOUTH 45 MINUTES PRIOR TO PROCEDURE   diltiazem 240 MG 24 hr capsule Commonly known as: CARDIZEM CD Take 240 mg by mouth daily.   Erleada 60 MG  tablet Generic drug: apalutamide Take 240 mg by mouth daily.   gabapentin 300 MG capsule Commonly known as: NEURONTIN gabapentin 300 mg capsule   HYDROcodone-acetaminophen 5-325 MG tablet Commonly known as: NORCO/VICODIN hydrocodone 5 mg-acetaminophen 325 mg tablet  TAKE 1 TABLET BY MOUTH EVERY 8 HOURS   ibuprofen 200 MG tablet Commonly known as: ADVIL Take 400 mg by mouth every 8 (eight) hours as needed for mild pain.   ipratropium 0.06 % nasal spray Commonly known as: ATROVENT Place 2 sprays into both nostrils 4 (four) times daily.   lisinopril 40 MG tablet Commonly known as: ZESTRIL Take 1 tablet (40 mg total) by mouth daily.   meloxicam 15 MG tablet Commonly known as: MOBIC Take 1 tablet (15 mg total) by mouth daily.   methocarbamol 500 MG tablet Commonly known as: ROBAXIN methocarbamol 500 mg tablet   metoprolol succinate 25 MG 24 hr tablet Commonly known as: TOPROL-XL TAKE 1 TABLET BY MOUTH EVERY DAY   multivitamin with minerals Tabs tablet Take 1 tablet by mouth daily.   omeprazole 40 MG capsule Commonly known as: PRILOSEC Take 1 capsule (40 mg total) by mouth daily as needed (acid reflux/indigestion).   tiZANidine 4 MG tablet Commonly known as: ZANAFLEX tizanidine 4 mg tablet   traMADol 50 MG tablet Commonly known as: ULTRAM tramadol 50 mg tablet  TAKE 1 TABLET BY MOUTH EVERY DAY AS NEEDED       Allergies:  Allergies  Allergen Reactions  . Prednisone Other (See Comments)    Sweating, hot flashes    Family History: Family History  Problem Relation Age of Onset  . Heart attack Mother   . Stroke Father   . Asthma Father   . Cancer Brother   . Diabetes Brother   . Prostate cancer Neg Hx   . Kidney cancer Neg Hx   . Bladder Cancer Neg Hx     Social History:  reports that he has quit smoking. His smoking use included cigars. He has never used smokeless tobacco. He reports current alcohol use of about 2.0 standard drinks of alcohol per  week. He reports that he does not use drugs.   Physical Exam: BP (!) 143/95   Pulse 63   Ht 6' (1.829 m)   Wt 220 lb (99.8 kg)   BMI 29.84 kg/m   Constitutional:  Alert and oriented, No acute distress. HEENT: [place] AT, moist mucus membranes.  Trachea midline, no masses. Cardiovascular: No clubbing, cyanosis, or edema. Respiratory: Normal respiratory effort, no increased work of breathing. Skin: No rashes, bruises or suspicious lesions. Neurologic: Grossly intact, no focal deficits, moving all 4 extremities. Psychiatric: Normal mood and affect.   Assessment & Plan:    1. Prostate cancer [facility]) Complex prostate cancer history as above, currently castrate sensitive metastatic to the pelvic lymph nodes without evidence of diffuse metastatic disease on PET scan  He is been managed on ADT alone, unable  to tolerate apalutamide  At this point time, he is interested in getting off ADT.  Given that his PSA rose precipitously with noncastrate levels in the past, this will not likely be a reasonable option.  He is now interested in consideration of whole pelvic radiation which is previously discussed.  He is open to having this discussion again.  Reviewed bone health recommendations  Plan for repeat radiation oncology referral  Follow-up in 6 months for PSA, possible ADT - leuprolide (6 Month) (ELIGARD) injection 45 mg - Ambulatory referral to Radiation Oncology - PSA; Future   Hollice Espy, MD  [facility] [address] [place], [place] [address] [phone]

## 2020-09-13 NOTE — Patient Instructions (Signed)
continue on Vitamin D 800-1000iu and Calium 1000-1200mg  daily while on Androgen Deprivation Therapy.

## 2020-09-24 ENCOUNTER — Ambulatory Visit: Admission: RE | Admit: 2020-09-24 | Payer: Medicare HMO | Source: Ambulatory Visit | Admitting: Radiation Oncology

## 2020-09-24 DIAGNOSIS — M5416 Radiculopathy, lumbar region: Secondary | ICD-10-CM | POA: Insufficient documentation

## 2020-09-28 DIAGNOSIS — M5416 Radiculopathy, lumbar region: Secondary | ICD-10-CM | POA: Diagnosis not present

## 2020-09-29 DIAGNOSIS — M25552 Pain in left hip: Secondary | ICD-10-CM | POA: Diagnosis not present

## 2020-09-29 DIAGNOSIS — M79605 Pain in left leg: Secondary | ICD-10-CM | POA: Diagnosis not present

## 2020-09-30 ENCOUNTER — Ambulatory Visit
Admission: EM | Admit: 2020-09-30 | Discharge: 2020-09-30 | Disposition: A | Discharge status: Home / Self Care | Payer: Medicare HMO | Attending: Emergency Medicine | Admitting: Emergency Medicine

## 2020-09-30 ENCOUNTER — Other Ambulatory Visit: Payer: Self-pay

## 2020-09-30 DIAGNOSIS — Z20822 Contact with and (suspected) exposure to covid-19: Secondary | ICD-10-CM | POA: Insufficient documentation

## 2020-09-30 DIAGNOSIS — J069 Acute upper respiratory infection, unspecified: Secondary | ICD-10-CM | POA: Insufficient documentation

## 2020-09-30 MED ORDER — BENZONATATE 100 MG PO CAPS: 100.0000 mg | ORAL_CAPSULE | Freq: Three times a day (TID) | ORAL | 0 refills | Status: DC

## 2020-09-30 MED ORDER — FLUTICASONE PROPIONATE 50 MCG/ACT NA SUSP: 2.0000 | Freq: Every day | NASAL | 0 refills | Status: DC

## 2020-09-30 NOTE — ED Triage Notes (Addendum)
Pt c/o sinus congestion for several days. Pt denies cough, sore throat, ear pain, f/n/v/d or other symptoms. Pt did take an at-home COVID test Thursday and it was negative.

## 2020-09-30 NOTE — ED Provider Notes (Signed)
[facility]    CSN: [id number] Arrival date & time: 09/30/20  1043      History   Chief Complaint Chief Complaint  Patient presents with  . Nasal Congestion    HPI ZACCHEUS EDMISTER is a 65 y.o. male.   HPI  Nasal Congestion: Patient states that he has had sinus congestion for multiple days.  Is not having any other symptoms such as sore throat, ear pain, fevers but he does report mild scant cough from drainage. He has tried Claritin which helped some. He did take at home COVID test on Thursday and it was negative.  No known sick contacts.   * pulse rate and BP are stable for pt Past Medical History:  Diagnosis Date  . Arthritis   . GERD (gastroesophageal reflux disease)   . Hypertension   . Prostate cancer ([place])   . Sleep apnea    no CPAP    Patient Active Problem List   Diagnosis Date Noted  . AAA (abdominal aortic aneurysm) ([place]) 08/29/2020  . Palpitations 03/29/2020  . PVC's (premature ventricular contractions) 03/29/2020  . Shortness of breath 03/29/2020  . Postoperative ileus ([place]) 10/15/2018  . Right lower quadrant abdominal pain   . Prostate cancer ([place]) 10/11/2018  . Lumbar sprain 09/04/2017  . Traumatic rupture of quadriceps tendon 09/02/2017  . Bursitis of hip 05/31/2015  . ED (erectile dysfunction) of organic origin 11/18/2014  . Essential hypertension 11/18/2014  . Neuropathy 11/18/2014  . Allergic rhinitis 11/14/2014  . Arthropathia 11/26/2009  . History of tobacco use 07/31/2008  . Acid reflux 07/26/2008  . Arthritis, degenerative 07/26/2008  . Apnea, sleep 06/30/2008    Past Surgical History:  Procedure Laterality Date  . APPENDECTOMY     At age 67  . CATARACT EXTRACTION W/PHACO Left 08/08/2020   Procedure: CATARACT EXTRACTION PHACO AND INTRAOCULAR LENS PLACEMENT (IOC) LEFT 5.62 01:00.4 903%;  Surgeon: Leandrew Koyanagi, MD;  Location: [place];  Service: Ophthalmology;  Laterality: Left;  . HEMORROIDECTOMY  1930  .  PELVIC LYMPH NODE DISSECTION Bilateral 10/11/2018   Procedure: PELVIC LYMPH NODE DISSECTION;  Surgeon: Hollice Espy, MD;  Location: [facility];  Service: Urology;  Laterality: Bilateral;  . QUADRICEPS TENDON REPAIR Left 09/09/2017   Procedure: REPAIR QUADRICEP TENDON;  Surgeon: Earnestine Leys, MD;  Location: [facility];  Service: Orthopedics;  Laterality: Left;  . ROBOT ASSISTED LAPAROSCOPIC RADICAL PROSTATECTOMY N/A 10/11/2018   Procedure: ROBOTIC ASSISTED LAPAROSCOPIC RADICAL PROSTATECTOMY;  Surgeon: Hollice Espy, MD;  Location: [facility];  Service: Urology;  Laterality: N/A;       Home Medications    Prior to Admission medications   Medication Sig Start Date End Date Taking? Authorizing Provider  diltiazem (CARDIZEM CD) 240 MG 24 hr capsule Take 240 mg by mouth daily.  03/30/20  Yes [provider]  gabapentin (NEURONTIN) 300 MG capsule gabapentin 300 mg capsule   Yes [provider]  lisinopril (ZESTRIL) 40 MG tablet Take 1 tablet (40 mg total) by mouth daily. 04/23/20  Yes [name], MD  meloxicam (MOBIC) 15 MG tablet Take 1 tablet (15 mg total) by mouth daily. 04/23/20  Yes [name], MD  metoprolol succinate (TOPROL-XL) 25 MG 24 hr tablet TAKE 1 TABLET BY MOUTH EVERY DAY 08/27/20  Yes Furth, Cadence H, PA-C  Multiple Vitamin (MULTIVITAMIN WITH MINERALS) TABS tablet Take 1 tablet by mouth daily.   Yes [provider]  sildenafil (REVATIO) 20 MG tablet See admin instructions.  09/28/19  Yes [provider]  ALPRAZolam (XANAX) 0.5 MG tablet alprazolam 0.5 mg tablet  TAKE 1- 2 TABS BY MOUTH 45 MINUTES PRIOR TO PROCEDURE    [provider]  ERLEADA 60 MG tablet Take 240 mg by mouth daily. 05/07/20   [provider]  HYDROcodone-acetaminophen (NORCO/VICODIN) 5-325 MG tablet hydrocodone 5 mg-acetaminophen 325 mg tablet  TAKE 1 TABLET BY MOUTH EVERY 8 HOURS    [provider]  ibuprofen (ADVIL) 200 MG tablet  Take 400 mg by mouth every 8 (eight) hours as needed for mild pain.    [provider]  ipratropium (ATROVENT) 0.06 % nasal spray Place 2 sprays into both nostrils 4 (four) times daily. 06/05/20   Margarette Canada, NP  methocarbamol (ROBAXIN) 500 MG tablet methocarbamol 500 mg tablet    [provider]  omeprazole (PRILOSEC) 40 MG capsule Take 1 capsule (40 mg total) by mouth daily as needed (acid reflux/indigestion). 04/23/20   [name], MD  tiZANidine (ZANAFLEX) 4 MG tablet tizanidine 4 mg tablet    [provider]  traMADol (ULTRAM) 50 MG tablet tramadol 50 mg tablet  TAKE 1 TABLET BY MOUTH EVERY DAY AS NEEDED    [provider]  fluticasone (FLONASE) 50 MCG/ACT nasal spray Place 1 spray into both nostrils daily. 08/02/19 06/05/20  Earleen Newport, MD    Family History Family History  Problem Relation Age of Onset  . Heart attack Mother   . Stroke Father   . Asthma Father   . Cancer Brother   . Diabetes Brother   . Prostate cancer Neg Hx   . Kidney cancer Neg Hx   . Bladder Cancer Neg Hx     Social History Social History   Tobacco Use  . Smoking status: Former Smoker    Types: Cigars  . Smokeless tobacco: Never Used  Vaping Use  . Vaping Use: Never used  Substance Use Topics  . Alcohol use: Yes    Alcohol/week: 2.0 standard drinks    Types: 2 Cans of beer per week    Comment: occasionally  . Drug use: No     Allergies   Patient has no active allergies.   Review of Systems Review of Systems  As stated above in HPI Physical Exam Triage Vital Signs ED Triage Vitals  Enc Vitals Group     BP 09/30/20 1140 (!) 168/91     Pulse Rate 09/30/20 1140 (!) 55     Resp 09/30/20 1140 18     Temp 09/30/20 1140 98.3 F (36.8 C)     Temp Source 09/30/20 1140 Oral     SpO2 09/30/20 1140 100 %     Weight 09/30/20 1136 220 lb (99.8 kg)     Height 09/30/20 1136 6' (1.829 m)     Head Circumference --      Peak Flow --       Pain Score 09/30/20 1136 0     Pain Loc --      Pain Edu? --      Excl. in [place]? --    No data found.  Updated Vital Signs BP (!) 168/91 (BP Location: Left Arm)   Pulse (!) 55   Temp 98.3 F (36.8 C) (Oral)   Resp 18   Ht 6' (1.829 m)   Wt 220 lb (99.8 kg)   SpO2 100%   BMI 29.84 kg/m   Rate improved to 62 on exam  Physical Exam Vitals  and nursing note reviewed.  Constitutional:      General: He is not in acute distress.    Appearance: Normal appearance. He is not ill-appearing, toxic-appearing or diaphoretic.  HENT:     Head: Normocephalic and atraumatic.     Right Ear: Tympanic membrane, ear canal and external ear normal.     Left Ear: Tympanic membrane, ear canal and external ear normal.     Nose: Congestion and rhinorrhea (clear) present.     Comments: No sinus bogginess or tenderness    Mouth/Throat:     Mouth: Mucous membranes are moist.     Pharynx: No oropharyngeal exudate or posterior oropharyngeal erythema.  Eyes:     Extraocular Movements: Extraocular movements intact.     Pupils: Pupils are equal, round, and reactive to light.  Cardiovascular:     Rate and Rhythm: Normal rate and regular rhythm.     Heart sounds: Normal heart sounds.  Pulmonary:     Effort: Pulmonary effort is normal.     Breath sounds: Normal breath sounds.  Musculoskeletal:     Cervical back: Normal range of motion and neck supple.  Lymphadenopathy:     Cervical: No cervical adenopathy.  Skin:    General: Skin is warm.  Neurological:     Mental Status: He is alert and oriented to person, place, and time.  Psychiatric:        Mood and Affect: Mood normal.        Behavior: Behavior normal.      UC Treatments / Results  Labs (all labs ordered are listed, but only abnormal results are displayed) Labs Reviewed  SARS CORONAVIRUS 2 (TAT 6-24 HRS)    EKG   Radiology No results found.  Procedures Procedures (including critical care time)  Medications Ordered in  UC Medications - No data to display  Initial Impression / Assessment and Plan / UC Course  I have reviewed the triage vital signs and the nursing notes.  Pertinent labs & imaging results that were available during my care of the patient were reviewed by me and considered in my medical decision making (see chart for details).    New.  Treating with Flonase and Tessalon.  We discussed how these medications work and their benefits.  We discussed red flag signs and symptoms. PCP follow up recommended.    Final Clinical Impressions(s) / UC Diagnoses   Final diagnoses:  None   Discharge Instructions   None    ED Prescriptions    None     PDMP not reviewed this encounter.   Hughie Closs, [place] 09/30/20 1200

## 2020-10-01 LAB — SARS CORONAVIRUS 2 (TAT 6-24 HRS): SARS Coronavirus 2: NEGATIVE

## 2020-10-08 DIAGNOSIS — M5416 Radiculopathy, lumbar region: Secondary | ICD-10-CM | POA: Diagnosis not present

## 2020-10-10 DIAGNOSIS — M5416 Radiculopathy, lumbar region: Secondary | ICD-10-CM | POA: Diagnosis not present

## 2020-10-18 DIAGNOSIS — M25552 Pain in left hip: Secondary | ICD-10-CM | POA: Diagnosis not present

## 2020-10-18 DIAGNOSIS — M79605 Pain in left leg: Secondary | ICD-10-CM | POA: Diagnosis not present

## 2020-10-19 DIAGNOSIS — M5416 Radiculopathy, lumbar region: Secondary | ICD-10-CM | POA: Diagnosis not present

## 2020-10-23 DIAGNOSIS — M461 Sacroiliitis, not elsewhere classified: Secondary | ICD-10-CM | POA: Diagnosis not present

## 2020-10-31 ENCOUNTER — Ambulatory Visit: Payer: Medicare HMO | Admitting: Internal Medicine

## 2020-10-31 DIAGNOSIS — M461 Sacroiliitis, not elsewhere classified: Secondary | ICD-10-CM | POA: Diagnosis not present

## 2020-12-10 DIAGNOSIS — M47817 Spondylosis without myelopathy or radiculopathy, lumbosacral region: Secondary | ICD-10-CM | POA: Diagnosis not present

## 2020-12-10 DIAGNOSIS — M25551 Pain in right hip: Secondary | ICD-10-CM | POA: Diagnosis not present

## 2020-12-26 DIAGNOSIS — S61411A Laceration without foreign body of right hand, initial encounter: Secondary | ICD-10-CM | POA: Diagnosis not present

## 2020-12-26 DIAGNOSIS — W540XXA Bitten by dog, initial encounter: Secondary | ICD-10-CM | POA: Diagnosis not present

## 2020-12-26 DIAGNOSIS — R52 Pain, unspecified: Secondary | ICD-10-CM | POA: Diagnosis not present

## 2020-12-31 ENCOUNTER — Ambulatory Visit (INDEPENDENT_AMBULATORY_CARE_PROVIDER_SITE_OTHER): Payer: Medicare HMO | Admitting: Family Medicine

## 2020-12-31 ENCOUNTER — Other Ambulatory Visit: Payer: Self-pay

## 2020-12-31 ENCOUNTER — Encounter: Payer: Self-pay | Admitting: Family Medicine

## 2020-12-31 VITALS — BP 143/88 | HR 59 | Temp 98.1°F | Wt 226.2 lb

## 2020-12-31 DIAGNOSIS — Z23 Encounter for immunization: Secondary | ICD-10-CM

## 2020-12-31 DIAGNOSIS — S61401A Unspecified open wound of right hand, initial encounter: Secondary | ICD-10-CM

## 2020-12-31 DIAGNOSIS — W540XXD Bitten by dog, subsequent encounter: Secondary | ICD-10-CM

## 2020-12-31 NOTE — Progress Notes (Signed)
Established patient visit   Patient: Jack Weaver   DOB: [birth year]   65 y.o. Male  MRN: [id number] Visit Date: 12/31/2020  Today's healthcare provider: Vernie Murders, PA-C   No chief complaint on file.  Subjective    HPI  -remove stitches and receive tetanus due to dog bite Sons dog bit him while in [place] visiting him -Did not get tetanus shot at the time because he was on vacation and did not want to worry about getting sick.  -Stitches are in palm of hand below pinky finger. Has had stitches in since last Wednesday. Was seen at urgent care.   Patient Active Problem List   Diagnosis Date Noted   AAA (abdominal aortic aneurysm) ([place]) 08/29/2020   Palpitations 03/29/2020   PVC's (premature ventricular contractions) 03/29/2020   Shortness of breath 03/29/2020   Postoperative ileus ([place]) 10/15/2018   Right lower quadrant abdominal pain    Prostate cancer ([place]) 10/11/2018   Lumbar sprain 09/04/2017   Traumatic rupture of quadriceps tendon 09/02/2017   Bursitis of hip 05/31/2015   ED (erectile dysfunction) of organic origin 11/18/2014   Essential hypertension 11/18/2014   Neuropathy 11/18/2014   Allergic rhinitis 11/14/2014   Arthropathia 11/26/2009   History of tobacco use 07/31/2008   Acid reflux 07/26/2008   Arthritis, degenerative 07/26/2008   Apnea, sleep 06/30/2008   Past Medical History:  Diagnosis Date   Arthritis    GERD (gastroesophageal reflux disease)    Hypertension    Prostate cancer ([place])    Sleep apnea    no CPAP   Past Surgical History:  Procedure Laterality Date   APPENDECTOMY     At age 88   CATARACT EXTRACTION W/PHACO Left 08/08/2020   Procedure: CATARACT EXTRACTION PHACO AND INTRAOCULAR LENS PLACEMENT (IOC) LEFT 5.62 01:00.4 903%;  Surgeon: Leandrew Koyanagi, MD;  Location: [place] [facility];  Service: Ophthalmology;  Laterality: Left;   HEMORROIDECTOMY  1930   PELVIC LYMPH NODE DISSECTION Bilateral 10/11/2018   Procedure:  PELVIC LYMPH NODE DISSECTION;  Surgeon: Hollice Espy, MD;  Location: [facility];  Service: Urology;  Laterality: Bilateral;   QUADRICEPS TENDON REPAIR Left 09/09/2017   Procedure: REPAIR QUADRICEP TENDON;  Surgeon: Earnestine Leys, MD;  Location: [facility];  Service: Orthopedics;  Laterality: Left;   ROBOT ASSISTED LAPAROSCOPIC RADICAL PROSTATECTOMY N/A 10/11/2018   Procedure: ROBOTIC ASSISTED LAPAROSCOPIC RADICAL PROSTATECTOMY;  Surgeon: Hollice Espy, MD;  Location: [facility];  Service: Urology;  Laterality: N/A;    No Active Allergies     Medications: Outpatient Medications Prior to Visit  Medication Sig   ALPRAZolam (XANAX) 0.5 MG tablet alprazolam 0.5 mg tablet  TAKE 1- 2 TABS BY MOUTH 45 MINUTES PRIOR TO PROCEDURE   benzonatate (TESSALON) 100 MG capsule Take 1 capsule (100 mg total) by mouth every 8 (eight) hours.   diltiazem (CARDIZEM CD) 240 MG 24 hr capsule Take 240 mg by mouth daily.    ERLEADA 60 MG tablet Take 240 mg by mouth daily.   fluticasone (FLONASE) 50 MCG/ACT nasal spray Place 2 sprays into both nostrils daily.   gabapentin (NEURONTIN) 300 MG capsule gabapentin 300 mg capsule   HYDROcodone-acetaminophen (NORCO/VICODIN) 5-325 MG tablet hydrocodone 5 mg-acetaminophen 325 mg tablet  TAKE 1 TABLET BY MOUTH EVERY 8 HOURS   ibuprofen (ADVIL) 200 MG tablet Take 400 mg by mouth every 8 (eight) hours as needed for mild pain.   ipratropium (ATROVENT) 0.06 % nasal spray Place 2 sprays into both nostrils 4 (  four) times daily.   lisinopril (ZESTRIL) 40 MG tablet Take 1 tablet (40 mg total) by mouth daily.   meloxicam (MOBIC) 15 MG tablet Take 1 tablet (15 mg total) by mouth daily.   methocarbamol (ROBAXIN) 500 MG tablet methocarbamol 500 mg tablet   metoprolol succinate (TOPROL-XL) 25 MG 24 hr tablet TAKE 1 TABLET BY MOUTH EVERY DAY   Multiple Vitamin (MULTIVITAMIN WITH MINERALS) TABS tablet Take 1 tablet by mouth daily.   omeprazole (PRILOSEC) 40 MG capsule Take 1 capsule (40 mg  total) by mouth daily as needed (acid reflux/indigestion).   sildenafil (REVATIO) 20 MG tablet See admin instructions.   tiZANidine (ZANAFLEX) 4 MG tablet tizanidine 4 mg tablet   traMADol (ULTRAM) 50 MG tablet tramadol 50 mg tablet  TAKE 1 TABLET BY MOUTH EVERY DAY AS NEEDED   No facility-administered medications prior to visit.    Review of Systems  All other systems reviewed and are negative.  Last CBC Lab Results  Component Value Date   WBC 4.2 08/23/2020   HGB 14.2 08/23/2020   HCT 42.8 08/23/2020   MCV 83 08/23/2020   MCH 27.5 08/23/2020   RDW 13.1 08/23/2020   PLT 232 [id number]   Last metabolic panel Lab Results  Component Value Date   GLUCOSE 107 (H) 08/23/2020   NA 137 08/23/2020   K 4.8 08/23/2020   CL 102 08/23/2020   CO2 21 08/23/2020   BUN 20 08/23/2020   CREATININE 1.16 08/23/2020   GFRNONAA 53 (L) 03/28/2020   GFRAA 61 03/28/2020   CALCIUM 9.7 08/23/2020   PHOS 3.8 09/04/2017   PROT 6.9 08/23/2020   ALBUMIN 4.5 08/23/2020   LABGLOB 2.4 08/23/2020   AGRATIO 1.9 08/23/2020   BILITOT 0.3 08/23/2020   ALKPHOS 125 (H) 08/23/2020   AST 34 08/23/2020   ALT 26 08/23/2020   ANIONGAP 10 08/02/2019   Last lipids Lab Results  Component Value Date   CHOL 167 07/02/2016   HDL 63 07/02/2016   LDLCALC 86 07/02/2016   TRIG 90 07/02/2016   Last hemoglobin A1c Lab Results  Component Value Date   HGBA1C 5.7 (H) 09/04/2017   Last thyroid functions Lab Results  Component Value Date   TSH 1.330 03/28/2020   Last vitamin D Lab Results  Component Value Date   VD25OH 27.8 (L) 08/23/2020   Last vitamin B12 and Folate Lab Results  Component Value Date   [id number] 07/02/2016       Objective    There were no vitals taken for this visit. BP Readings from Last 3 Encounters:  09/30/20 (!) 168/91  09/13/20 (!) 143/95  08/24/20 138/78   Wt Readings from Last 3 Encounters:  09/30/20 220 lb (99.8 kg)  09/13/20 220 lb (99.8 kg)  08/24/20 214 lb  (97.1 kg)    Physical Exam Constitutional:      General: He is not in acute distress.    Appearance: He is well-developed.  HENT:     Head: Normocephalic and atraumatic.     Right Ear: Hearing normal.     Left Ear: Hearing normal.     Nose: Nose normal.  Eyes:     General: Lids are normal. No scleral icterus.       Right eye: No discharge.        Left eye: No discharge.     Conjunctiva/sclera: Conjunctivae normal.  Pulmonary:     Effort: Pulmonary effort is normal. No respiratory distress.  Musculoskeletal:  General: Normal range of motion.  Skin:    Findings: No lesion or rash.     Comments: 2 cm laceration right palm at the base of the 5th finger. No purulent drainage. Some tenderness but no erythema.  Neurological:     Mental Status: He is alert and oriented to person, place, and time.  Psychiatric:        Speech: Speech normal.        Behavior: Behavior normal.        Thought Content: Thought content normal.    No results found for any visits on 12/31/20.  Assessment & Plan     1. Dog bite, subsequent encounter Laceration to the right palm at the base of the 5th finger. Sutured at an Urgent Care in [place] on 12-26-20. Healing well but sutures are beginning to come loose. Removed 4 sutures and applied steri-strips to maintain wound edge approximation. No signs of infection. Was given antibiotics that he started the past couple days. Keep area clean and dry. Finish all the antibiotic and given tetanus booster. Recheck prn. - Td vaccine greater than or equal to 7yo preservative free IM  2. Need for tetanus booster - Td vaccine greater than or equal to 7yo preservative free IM   No follow-ups on file.      I, Kanyon Bunn, PA-C, have reviewed all documentation for this visit. The documentation on 12/31/20 for the exam, diagnosis, procedures, and orders are all accurate and complete.    Vernie Murders, PA-C  [employer] [phone]  (phone) [phone] (fax)  [place]

## 2021-01-21 DIAGNOSIS — M47817 Spondylosis without myelopathy or radiculopathy, lumbosacral region: Secondary | ICD-10-CM | POA: Diagnosis not present

## 2021-01-21 DIAGNOSIS — M25551 Pain in right hip: Secondary | ICD-10-CM | POA: Diagnosis not present

## 2021-01-24 ENCOUNTER — Other Ambulatory Visit: Payer: Self-pay | Admitting: Medical

## 2021-01-24 DIAGNOSIS — I10 Essential (primary) hypertension: Secondary | ICD-10-CM

## 2021-02-06 DIAGNOSIS — S91112A Laceration without foreign body of left great toe without damage to nail, initial encounter: Secondary | ICD-10-CM | POA: Diagnosis not present

## 2021-02-08 DIAGNOSIS — S91112D Laceration without foreign body of left great toe without damage to nail, subsequent encounter: Secondary | ICD-10-CM | POA: Diagnosis not present

## 2021-02-11 ENCOUNTER — Encounter: Payer: Self-pay | Admitting: Family Medicine

## 2021-02-11 ENCOUNTER — Ambulatory Visit: Payer: Self-pay | Admitting: *Deleted

## 2021-02-11 ENCOUNTER — Telehealth: Payer: Self-pay

## 2021-02-11 ENCOUNTER — Ambulatory Visit (INDEPENDENT_AMBULATORY_CARE_PROVIDER_SITE_OTHER): Payer: Medicare HMO | Admitting: Family Medicine

## 2021-02-11 ENCOUNTER — Other Ambulatory Visit: Payer: Self-pay

## 2021-02-11 VITALS — BP 171/86 | HR 54 | Temp 98.4°F | Ht 71.0 in | Wt 230.8 lb

## 2021-02-11 DIAGNOSIS — L03032 Cellulitis of left toe: Secondary | ICD-10-CM

## 2021-02-11 MED ORDER — AMOXICILLIN-POT CLAVULANATE 875-125 MG PO TABS: 1.0000 | ORAL_TABLET | Freq: Two times a day (BID) | ORAL | 1 refills | Status: DC

## 2021-02-11 MED ORDER — DILTIAZEM HCL ER COATED BEADS 360 MG PO CP24: 360.0000 mg | ORAL_CAPSULE | Freq: Every day | ORAL | 3 refills | Status: DC

## 2021-02-11 NOTE — Telephone Encounter (Signed)
Left message to call back. Ok for [facility] to advise as below.

## 2021-02-11 NOTE — Telephone Encounter (Signed)
Copied from [place] [id number]. Topic: Appointment Scheduling - Scheduling Inquiry for Clinic >> Feb 11, 2021  8:18 AM Lennox Solders wrote: Reason for CRM: Pt went to urgent care on 02-06-2021 with cut on left big toe and was told to follow up in 3 to 4 days. Pt would like to see dr Rosanna Randy if possible. Pt is requesting to be seen today

## 2021-02-11 NOTE — Progress Notes (Signed)
Established patient visit   Patient: Jack Weaver   DOB: 05/16/1956   65 y.o. Male  MRN: [id number] Visit Date: 02/11/2021  Today's healthcare provider: Wilhemena Durie, MD   No chief complaint on file.  Subjective    HPI  Lawn mower fell on big toe took place last Wednesday around 5:30 pm. Tylenol for pain 2 tabs BID Patient states he was seen in UC on 02/06/21 Patient was mowing his yard and tripped going backwards and pulled a running lawnmower over his left great toe.  He sustained significant trauma to the lateral distal portion of the toe.  No drainage but he has to walk with a sandal on.  No fever or chills.    Medications: Outpatient Medications Prior to Visit  Medication Sig   ALPRAZolam (XANAX) 0.5 MG tablet alprazolam 0.5 mg tablet  TAKE 1- 2 TABS BY MOUTH 45 MINUTES PRIOR TO PROCEDURE   benzonatate (TESSALON) 100 MG capsule Take 1 capsule (100 mg total) by mouth every 8 (eight) hours.   diltiazem (CARDIZEM CD) 240 MG 24 hr capsule Take 240 mg by mouth daily.    ERLEADA 60 MG tablet Take 240 mg by mouth daily.   fluticasone (FLONASE) 50 MCG/ACT nasal spray Place 2 sprays into both nostrils daily.   gabapentin (NEURONTIN) 300 MG capsule gabapentin 300 mg capsule   HYDROcodone-acetaminophen (NORCO/VICODIN) 5-325 MG tablet hydrocodone 5 mg-acetaminophen 325 mg tablet  TAKE 1 TABLET BY MOUTH EVERY 8 HOURS   ibuprofen (ADVIL) 200 MG tablet Take 400 mg by mouth every 8 (eight) hours as needed for mild pain.   ipratropium (ATROVENT) 0.06 % nasal spray Place 2 sprays into both nostrils 4 (four) times daily.   lisinopril (ZESTRIL) 40 MG tablet Take 1 tablet (40 mg total) by mouth daily.   meloxicam (MOBIC) 15 MG tablet Take 1 tablet (15 mg total) by mouth daily.   methocarbamol (ROBAXIN) 500 MG tablet methocarbamol 500 mg tablet   metoprolol succinate (TOPROL-XL) 25 MG 24 hr tablet Take by mouth.   metoprolol succinate (TOPROL-XL) 25 MG 24 hr tablet TAKE 1 TABLET BY  MOUTH EVERY DAY   Multiple Vitamin (MULTIVITAMIN WITH MINERALS) TABS tablet Take 1 tablet by mouth daily.   omeprazole (PRILOSEC) 40 MG capsule Take 1 capsule (40 mg total) by mouth daily as needed (acid reflux/indigestion).   sildenafil (REVATIO) 20 MG tablet See admin instructions.   tiZANidine (ZANAFLEX) 4 MG tablet tizanidine 4 mg tablet   traMADol (ULTRAM) 50 MG tablet tramadol 50 mg tablet  TAKE 1 TABLET BY MOUTH EVERY DAY AS NEEDED   No facility-administered medications prior to visit.    Review of Systems      Objective    There were no vitals taken for this visit. BP Readings from Last 3 Encounters:  02/11/21 (!) 171/86  12/31/20 (!) 143/88  09/30/20 (!) 168/91   Wt Readings from Last 3 Encounters:  02/11/21 230 lb 12.8 oz (104.7 kg)  12/31/20 226 lb 3.2 oz (102.6 kg)  09/30/20 220 lb (99.8 kg)      Physical Exam Constitutional:      General: He is not in acute distress.    Appearance: He is well-developed.  HENT:     Head: Normocephalic and atraumatic.     Right Ear: Hearing normal.     Left Ear: Hearing normal.     Nose: Nose normal.  Eyes:     General: Lids are normal. No scleral icterus.  Right eye: No discharge.        Left eye: No discharge.     Conjunctiva/sclera: Conjunctivae normal.  Cardiovascular:     Rate and Rhythm: Normal rate and regular rhythm.     Heart sounds: Normal heart sounds.  Pulmonary:     Effort: Pulmonary effort is normal. No respiratory distress.  Musculoskeletal:     Comments: The medial portion of the left great toe is macerated from trauma and flesh wound.  He is missing the outer third distal half of the great toenail.  There is no drainage or obvious deformity of the toe beyond the direct trauma that he sustained  Skin:    Findings: No lesion or rash.  Neurological:     General: No focal deficit present.     Mental Status: He is alert and oriented to person, place, and time.  Psychiatric:        Mood and Affect:  Mood normal.        Speech: Speech normal.        Behavior: Behavior normal.        Thought Content: Thought content normal.        Judgment: Judgment normal.      No results found for any visits on 02/11/21.  Assessment & Plan     1. Cellulitis of great toe of left foot/trauma  Patient has sustained a significant injury to the left great distal toe from a lawnmower blade.  We will cover for infection with Augmentin today we will go ahead and refer to podiatry as I think you will need a have this debrided - Ambulatory referral to Podiatry   No follow-ups on file.      I, Wilhemena Durie, MD, have reviewed all documentation for this visit. The documentation on 02/14/21 for the exam, diagnosis, procedures, and orders are all accurate and complete.    Erich Kochan Cranford Mon, MD  [facility] [phone] (phone) [phone] (fax)  [place]

## 2021-02-11 NOTE — Telephone Encounter (Signed)
Pt called and is requesting to have a nurse give him a call to go over his medications as he has had some recent changes and is not sure which ones he should be taking. Please advise. Answer Assessment - Initial Assessment Questions 1. NAME of MEDICATION: "What medicine are you calling about?"     Diltiazem 360mg  in regards to other medications 2. QUESTION: "What is your question?" (e.g., double dose of medicine, side effect)     Patient was in the office today and PCP has increased dosing on Diltiazem. Patient wants to know if he needs to stop his other medications: lisinopril 40mg  and metoprolol 25mg . Patient states he was only taking these 2 medications as of today- the cardiologist had taken him off Diltiazem completely. 3. PRESCRIBING HCP: "Who prescribed it?" Reason: if prescribed by specialist, call should be referred to that group.     PCP  Protocols used: Medication Question Call-A-AH

## 2021-02-11 NOTE — Telephone Encounter (Signed)
Patient is being seen in the office.

## 2021-02-12 NOTE — Telephone Encounter (Signed)
Patient called and advised of Dr. Alben Spittle recommendation below, he says he already spoke to him about not taking Diltiazem.  [name], MD  Rosanna Randy Nurse 19 hours ago (3:41 PM)   He was in some pain today is so less just stay on what he is on and not take the diltiazem.  He is on maximum doses of the 2 medications he is on there.  Thanks

## 2021-02-13 DIAGNOSIS — S91212A Laceration without foreign body of left great toe with damage to nail, initial encounter: Secondary | ICD-10-CM | POA: Diagnosis not present

## 2021-02-13 DIAGNOSIS — W28XXXA Contact with powered lawn mower, initial encounter: Secondary | ICD-10-CM | POA: Diagnosis not present

## 2021-02-13 DIAGNOSIS — S92912B Unspecified fracture of left toe(s), initial encounter for open fracture: Secondary | ICD-10-CM | POA: Diagnosis not present

## 2021-02-20 DIAGNOSIS — S92912D Unspecified fracture of left toe(s), subsequent encounter for fracture with routine healing: Secondary | ICD-10-CM | POA: Diagnosis not present

## 2021-02-20 DIAGNOSIS — S91212D Laceration without foreign body of left great toe with damage to nail, subsequent encounter: Secondary | ICD-10-CM | POA: Diagnosis not present

## 2021-02-20 DIAGNOSIS — W28XXXD Contact with powered lawn mower, subsequent encounter: Secondary | ICD-10-CM | POA: Diagnosis not present

## 2021-03-12 DIAGNOSIS — R059 Cough, unspecified: Secondary | ICD-10-CM | POA: Insufficient documentation

## 2021-03-12 DIAGNOSIS — Z125 Encounter for screening for malignant neoplasm of prostate: Secondary | ICD-10-CM | POA: Insufficient documentation

## 2021-03-12 DIAGNOSIS — Z1211 Encounter for screening for malignant neoplasm of colon: Secondary | ICD-10-CM | POA: Insufficient documentation

## 2021-03-12 DIAGNOSIS — S92912D Unspecified fracture of left toe(s), subsequent encounter for fracture with routine healing: Secondary | ICD-10-CM | POA: Diagnosis not present

## 2021-03-12 DIAGNOSIS — R209 Unspecified disturbances of skin sensation: Secondary | ICD-10-CM | POA: Insufficient documentation

## 2021-03-12 DIAGNOSIS — G5600 Carpal tunnel syndrome, unspecified upper limb: Secondary | ICD-10-CM | POA: Insufficient documentation

## 2021-03-12 DIAGNOSIS — S91212D Laceration without foreign body of left great toe with damage to nail, subsequent encounter: Secondary | ICD-10-CM | POA: Diagnosis not present

## 2021-03-12 DIAGNOSIS — R439 Unspecified disturbances of smell and taste: Secondary | ICD-10-CM | POA: Insufficient documentation

## 2021-03-12 DIAGNOSIS — J019 Acute sinusitis, unspecified: Secondary | ICD-10-CM | POA: Insufficient documentation

## 2021-03-12 DIAGNOSIS — R001 Bradycardia, unspecified: Secondary | ICD-10-CM | POA: Insufficient documentation

## 2021-03-12 DIAGNOSIS — W28XXXD Contact with powered lawn mower, subsequent encounter: Secondary | ICD-10-CM | POA: Diagnosis not present

## 2021-03-12 DIAGNOSIS — R519 Headache, unspecified: Secondary | ICD-10-CM | POA: Insufficient documentation

## 2021-03-12 DIAGNOSIS — H539 Unspecified visual disturbance: Secondary | ICD-10-CM | POA: Insufficient documentation

## 2021-03-12 DIAGNOSIS — S8390XA Sprain of unspecified site of unspecified knee, initial encounter: Secondary | ICD-10-CM | POA: Insufficient documentation

## 2021-03-12 DIAGNOSIS — R112 Nausea with vomiting, unspecified: Secondary | ICD-10-CM | POA: Insufficient documentation

## 2021-03-15 DIAGNOSIS — H2511 Age-related nuclear cataract, right eye: Secondary | ICD-10-CM | POA: Diagnosis not present

## 2021-03-21 ENCOUNTER — Other Ambulatory Visit: Payer: Medicare HMO

## 2021-03-21 ENCOUNTER — Other Ambulatory Visit: Payer: Self-pay

## 2021-03-21 DIAGNOSIS — C61 Malignant neoplasm of prostate: Secondary | ICD-10-CM | POA: Diagnosis not present

## 2021-03-22 LAB — PSA: Prostate Specific Ag, Serum: <0.1 ng/mL (ref 0.0–4.0)

## 2021-03-25 ENCOUNTER — Other Ambulatory Visit: Payer: Self-pay | Admitting: *Deleted

## 2021-03-25 DIAGNOSIS — M461 Sacroiliitis, not elsewhere classified: Secondary | ICD-10-CM | POA: Diagnosis not present

## 2021-03-25 DIAGNOSIS — N289 Disorder of kidney and ureter, unspecified: Secondary | ICD-10-CM

## 2021-03-25 NOTE — Progress Notes (Signed)
03/26/21 9:04 AM   Jack Weaver 02-Feb-1956 [id number]  Referring provider:  [name], MD [address] [place],  [place] [address] Chief Complaint  Patient presents with   Prostate Cancer    38mo follow up     HPI: Jack Weaver is a 65 y.o.male with a personal history of prostate cancer and stress incontinence, who returns today for 6 month follow-up with eligard, and PSA.   He underwent radical prostatectomy non-nerve sparing with bilateral pelvic lymph node dissection on 10/01/2018.  Surgical pathology was consistent with Gleason 3+5 disease, pT3a N1.  He was noted to have extraprostatic extension with positive posterior margins both left and right.  He is also noted to have 2 of 3 lymph nodes on the left with focal metastatic disease.  0 of 3 lymph nodes on the right.  No bladder neck involvement.  No seminal vesicle involvement.  He underwent staging axiom PET scan on 11/08/2018 that was negative for metastatic disease and was started on Lupron. He declined whole pelvic radiation.   His PSA in 2021 had risen on ADT however, testosterone indicated that he did not achieve castrate levels and a second dose of ADT was administered with return of his PSA to undetectable levels.  He did not tolerate apalutamide as additional adjuvant treatment to his ADT.   PSA is undetectable as of 03/21/2021.   He is doing well today. He reports the he had a conversation with his wife regarding radiation to the pelvis and they decided not to proceed. He is primarily worried today about his weight gain affecting his sciatica. He reports he eats healthy and tries to exercise but it has not had any affect of his weight gain.     PMH: Past Medical History:  Diagnosis Date   Arthritis    GERD (gastroesophageal reflux disease)    Hypertension    Prostate cancer ([place])    Sleep apnea    no CPAP    Surgical History: Past Surgical History:  Procedure Laterality Date    APPENDECTOMY     At age 48   CATARACT EXTRACTION W/PHACO Left 08/08/2020   Procedure: CATARACT EXTRACTION PHACO AND INTRAOCULAR LENS PLACEMENT (IOC) LEFT 5.62 01:00.4 903%;  Surgeon: Leandrew Koyanagi, MD;  Location: [place];  Service: Ophthalmology;  Laterality: Left;   HEMORROIDECTOMY  1930   PELVIC LYMPH NODE DISSECTION Bilateral 10/11/2018   Procedure: PELVIC LYMPH NODE DISSECTION;  Surgeon: Hollice Espy, MD;  Location: [facility];  Service: Urology;  Laterality: Bilateral;   QUADRICEPS TENDON REPAIR Left 09/09/2017   Procedure: REPAIR QUADRICEP TENDON;  Surgeon: Earnestine Leys, MD;  Location: [facility];  Service: Orthopedics;  Laterality: Left;   ROBOT ASSISTED LAPAROSCOPIC RADICAL PROSTATECTOMY N/A 10/11/2018   Procedure: ROBOTIC ASSISTED LAPAROSCOPIC RADICAL PROSTATECTOMY;  Surgeon: Hollice Espy, MD;  Location: [facility];  Service: Urology;  Laterality: N/A;    Home Medications:  Allergies as of 03/26/2021   No Known Allergies      Medication List        Accurate as of March 26, 2021  9:04 AM. If you have any questions, ask your nurse or doctor.          STOP taking these medications    ALPRAZolam 0.5 MG tablet Commonly known as: Duanne Moron Stopped by: Hollice Espy, MD   amoxicillin-clavulanate [id number] MG tablet Commonly known as: Augmentin Stopped by: Hollice Espy, MD   benzonatate 100 MG capsule Commonly known as: TESSALON  Stopped by: Hollice Espy, MD   diltiazem 360 MG 24 hr capsule Commonly known as: Cardizem CD Stopped by: Hollice Espy, MD   Erleada 60 MG tablet Generic drug: apalutamide Stopped by: Hollice Espy, MD   fluticasone 50 MCG/ACT nasal spray Commonly known as: FLONASE Stopped by: Hollice Espy, MD   HYDROcodone-acetaminophen 5-325 MG tablet Commonly known as: NORCO/VICODIN Stopped by: Hollice Espy, MD   ipratropium 0.06 % nasal spray Commonly known as: ATROVENT Stopped by: Hollice Espy, MD   methocarbamol  500 MG tablet Commonly known as: ROBAXIN Stopped by: Hollice Espy, MD   sildenafil 20 MG tablet Commonly known as: REVATIO Stopped by: Hollice Espy, MD   tiZANidine 4 MG tablet Commonly known as: ZANAFLEX Stopped by: Hollice Espy, MD       TAKE these medications    gabapentin 300 MG capsule Commonly known as: NEURONTIN gabapentin 300 mg capsule   ibuprofen 200 MG tablet Commonly known as: ADVIL Take 400 mg by mouth every 8 (eight) hours as needed for mild pain.   lisinopril 40 MG tablet Commonly known as: ZESTRIL Take 1 tablet (40 mg total) by mouth daily.   meloxicam 15 MG tablet Commonly known as: MOBIC Take 1 tablet (15 mg total) by mouth daily.   metoprolol succinate 25 MG 24 hr tablet Commonly known as: TOPROL-XL TAKE 1 TABLET BY MOUTH EVERY DAY What changed: Another medication with the same name was removed. Continue taking this medication, and follow the directions you see here. Changed by: Hollice Espy, MD   multivitamin with minerals Tabs tablet Take 1 tablet by mouth daily.   omeprazole 40 MG capsule Commonly known as: PRILOSEC Take 1 capsule (40 mg total) by mouth daily as needed (acid reflux/indigestion).   traMADol 50 MG tablet Commonly known as: ULTRAM tramadol 50 mg tablet  TAKE 1 TABLET BY MOUTH EVERY DAY AS NEEDED        Allergies: No Known Allergies  Family History: Family History  Problem Relation Age of Onset   Heart attack Mother    Stroke Father    Asthma Father    Cancer Brother    Diabetes Brother    Prostate cancer Neg Hx    Kidney cancer Neg Hx    Bladder Cancer Neg Hx     Social History:  reports that he has quit smoking. His smoking use included cigars. He has never used smokeless tobacco. He reports current alcohol use of about 2.0 standard drinks per week. He reports that he does not use drugs.   Physical Exam: BP (!) 175/103   Pulse (!) 56   Ht 5\' 11"  (1.803 m)   Wt 229 lb (103.9 kg)   BMI 31.94 kg/m    Constitutional:  Alert and oriented, No acute distress. HEENT: [place] AT, moist mucus membranes.  Trachea midline, no masses. Cardiovascular: No clubbing, cyanosis, or edema. Respiratory: Normal respiratory effort, no increased work of breathing. Skin: No rashes, bruises or suspicious lesions. Neurologic: Grossly intact, no focal deficits, moving all 4 extremities. Psychiatric: Normal mood and affect.  Laboratory Data:  Lab Results  Component Value Date   CREATININE 1.16 08/23/2020    Assessment & Plan:    Prostate cancer  - Complex, currently castrate sensitive metastatic to the pelvic lymph nodes   - Managed on ADT alone for biochemical occurrence, unable to tolerate  apalutamide   - Declined additional oral agents and adjuvant  therapy. He is satisfied with his current regimen.    - leuprolide (  6 Month) (ELIGARD) injection 45 mg - PSA; Pending  - PSA; Future  - He is concerned about his weight gain and the affect it has been having on his sciatica.   - Discussed the benefits of exercising regularly and eating balanced and healthy meals  for  cardiovascular health   Follow-up in 6 months with PSA and Eligard   I,Kailey Littlejohn,acting as a scribe for Hollice Espy, MD.,have documented all relevant documentation on the behalf of Hollice Espy, MD,as directed by  Hollice Espy, MD while in the presence of Hollice Espy, MD.  I have reviewed the above documentation for accuracy and completeness, and I agree with the above.   Hollice Espy, MD    [facility] [address] [place], [place] [address] [phone]

## 2021-03-26 ENCOUNTER — Encounter: Payer: Self-pay | Admitting: Urology

## 2021-03-26 ENCOUNTER — Ambulatory Visit: Payer: Medicare HMO | Admitting: Urology

## 2021-03-26 ENCOUNTER — Other Ambulatory Visit: Payer: Self-pay

## 2021-03-26 VITALS — BP 175/103 | HR 56 | Ht 71.0 in | Wt 229.0 lb

## 2021-03-26 DIAGNOSIS — C61 Malignant neoplasm of prostate: Secondary | ICD-10-CM

## 2021-03-26 MED ORDER — LEUPROLIDE ACETATE (6 MONTH) 45 MG ~~LOC~~ KIT
45.0000 mg | PACK | Freq: Once | SUBCUTANEOUS | Status: AC
  Administered 2021-03-26: 45 mg via SUBCUTANEOUS

## 2021-03-26 NOTE — Progress Notes (Signed)
Eligard SubQ Injection   Due to Prostate Cancer patient is present today for a Eligard Injection.  Medication: Eligard 6 month Dose: 45 mg  Location: right upper outer buttocks Lot: [id number] Exp: 07/2022  Patient tolerated well, no complications were noted  Performed by: Fonnie Jarvis, CMA  Per Dr. [name] patient is to continue therapy indefinitely  . Patient's next follow up was scheduled for 09/25/21. This appointment was scheduled using wheel and given to patient today along with reminder continue on Vitamin D 800-1000iu and Calium 1000-1200mg  daily while on Androgen Deprivation Therapy.  PA approval dates: No PA required

## 2021-03-27 DIAGNOSIS — N289 Disorder of kidney and ureter, unspecified: Secondary | ICD-10-CM | POA: Diagnosis not present

## 2021-03-28 LAB — RENAL FUNCTION PANEL
Albumin: 4.5 g/dL (ref 3.8–4.8)
BUN/Creatinine Ratio: 12 (ref 10–24)
BUN: 13 mg/dL (ref 8–27)
CO2: 22 mmol/L (ref 20–29)
Calcium: 9.7 mg/dL (ref 8.6–10.2)
Chloride: 105 mmol/L (ref 96–106)
Creatinine, Ser: 1.1 mg/dL (ref 0.76–1.27)
Glucose: 100 mg/dL — ABNORMAL HIGH (ref 70–99)
Phosphorus: 3.8 mg/dL (ref 2.8–4.1)
Potassium: 4.4 mmol/L (ref 3.5–5.2)
Sodium: 140 mmol/L (ref 134–144)
eGFR: 74 mL/min/{1.73_m2} (ref >59)

## 2021-04-03 DIAGNOSIS — M461 Sacroiliitis, not elsewhere classified: Secondary | ICD-10-CM | POA: Diagnosis not present

## 2021-04-11 ENCOUNTER — Other Ambulatory Visit: Payer: Self-pay

## 2021-04-11 ENCOUNTER — Ambulatory Visit (INDEPENDENT_AMBULATORY_CARE_PROVIDER_SITE_OTHER): Payer: Medicare HMO | Admitting: Family Medicine

## 2021-04-11 VITALS — BP 154/96 | HR 54 | Temp 97.6°F | Wt 228.4 lb

## 2021-04-11 DIAGNOSIS — K219 Gastro-esophageal reflux disease without esophagitis: Secondary | ICD-10-CM

## 2021-04-11 DIAGNOSIS — I7143 Infrarenal abdominal aortic aneurysm, without rupture: Secondary | ICD-10-CM | POA: Diagnosis not present

## 2021-04-11 DIAGNOSIS — N529 Male erectile dysfunction, unspecified: Secondary | ICD-10-CM | POA: Diagnosis not present

## 2021-04-11 DIAGNOSIS — I10 Essential (primary) hypertension: Secondary | ICD-10-CM

## 2021-04-11 DIAGNOSIS — G629 Polyneuropathy, unspecified: Secondary | ICD-10-CM | POA: Diagnosis not present

## 2021-04-11 DIAGNOSIS — C61 Malignant neoplasm of prostate: Secondary | ICD-10-CM

## 2021-04-11 MED ORDER — AMLODIPINE BESYLATE 5 MG PO TABS: 5.0000 mg | ORAL_TABLET | Freq: Every day | ORAL | 1 refills | Status: DC

## 2021-04-11 NOTE — Progress Notes (Signed)
Established patient visit   Patient: Jack Weaver   DOB: 1955/06/29   65 y.o. Male  MRN: [id number] Visit Date: 04/11/2021  Today's healthcare provider: Wilhemena Durie, MD   Chief Complaint  Patient presents with   Follow-up   Subjective    HPI  Patient feels well and has no complaints today.  Follow-up on blood pressure. Foot is doing better from the lawn mower  accident. Follow up: Patient states he is here to follow up on an abdominal aneurysm. Patient had a CT abdomen and pelvis on 10/15/2018 which showed dilation of the distal aorta 3.4 x 3.4 cm. Per CT report, recommend follow up by ultrasound in 3 years.     Medications: Outpatient Medications Prior to Visit  Medication Sig   gabapentin (NEURONTIN) 300 MG capsule gabapentin 300 mg capsule   ibuprofen (ADVIL) 200 MG tablet Take 400 mg by mouth every 8 (eight) hours as needed for mild pain.   lisinopril (ZESTRIL) 40 MG tablet Take 1 tablet (40 mg total) by mouth daily.   meloxicam (MOBIC) 15 MG tablet Take 1 tablet (15 mg total) by mouth daily.   metoprolol succinate (TOPROL-XL) 25 MG 24 hr tablet TAKE 1 TABLET BY MOUTH EVERY DAY   Multiple Vitamin (MULTIVITAMIN WITH MINERALS) TABS tablet Take 1 tablet by mouth daily.   omeprazole (PRILOSEC) 40 MG capsule Take 1 capsule (40 mg total) by mouth daily as needed (acid reflux/indigestion).   traMADol (ULTRAM) 50 MG tablet tramadol 50 mg tablet  TAKE 1 TABLET BY MOUTH EVERY DAY AS NEEDED   No facility-administered medications prior to visit.    Review of Systems      Objective    BP (!) 154/96 (BP Location: Right Arm, Patient Position: Sitting, Cuff Size: Normal)   Pulse (!) 54   Temp 97.6 F (36.4 C) (Temporal)   Wt 228 lb 6.4 oz (103.6 kg)   SpO2 100%   BMI 31.86 kg/m  BP Readings from Last 3 Encounters:  04/11/21 (!) 154/96  03/26/21 (!) 175/103  02/11/21 (!) 171/86   Wt Readings from Last 3 Encounters:  04/11/21 228 lb 6.4 oz (103.6 kg)   03/26/21 229 lb (103.9 kg)  02/11/21 230 lb 12.8 oz (104.7 kg)      Physical Exam Vitals reviewed.  Constitutional:      General: He is not in acute distress.    Appearance: He is well-developed.  Eyes:     Pupils: Pupils are equal, round, and reactive to light.  Cardiovascular:     Rate and Rhythm: Normal rate and regular rhythm.  Pulmonary:     Breath sounds: Normal breath sounds. No wheezing.  Abdominal:     Palpations: Abdomen is soft.     Tenderness: There is no abdominal tenderness.  Musculoskeletal:     Right lower leg: No edema.     Left lower leg: No edema.  Skin:    General: Skin is warm and dry.  Neurological:     General: No focal deficit present.     Mental Status: He is oriented to person, place, and time.  Psychiatric:        Mood and Affect: Mood normal.        Behavior: Behavior normal.        Thought Content: Thought content normal.        Judgment: Judgment normal.      No results found for any visits on 04/11/21.  Assessment &  Plan     1. Essential hypertension Amlodipine 5 mg daily to lisinopril 40 and metoprolol 25  2. Infrarenal abdominal aortic aneurysm (AAA) without rupture Follow-up in early 2024 unless he has symptoms  3. ED (erectile dysfunction) of organic origin   4. Neuropathy On gabapentin 300mg   5. Gastroesophageal reflux disease without esophagitis On omeprazole 40  6. Prostate cancer [facility]) Treated by urology with recent hormone therapy   Return in about 4 months (around 08/09/2021).      I, Wilhemena Durie, MD, have reviewed all documentation for this visit. The documentation on 04/13/21 for the exam, diagnosis, procedures, and orders are all accurate and complete.    Caden Fatica Cranford Mon, MD  [facility] [phone] (phone) [phone] (fax)  [place]

## 2021-04-19 ENCOUNTER — Other Ambulatory Visit: Payer: Self-pay | Admitting: Family Medicine

## 2021-04-19 DIAGNOSIS — I10 Essential (primary) hypertension: Secondary | ICD-10-CM

## 2021-05-09 DIAGNOSIS — J019 Acute sinusitis, unspecified: Secondary | ICD-10-CM | POA: Diagnosis not present

## 2021-05-09 DIAGNOSIS — Z20822 Contact with and (suspected) exposure to covid-19: Secondary | ICD-10-CM | POA: Diagnosis not present

## 2021-05-09 DIAGNOSIS — M791 Myalgia, unspecified site: Secondary | ICD-10-CM | POA: Diagnosis not present

## 2021-05-27 ENCOUNTER — Other Ambulatory Visit: Payer: Self-pay | Admitting: Family Medicine

## 2021-05-27 DIAGNOSIS — K2970 Gastritis, unspecified, without bleeding: Secondary | ICD-10-CM

## 2021-05-27 DIAGNOSIS — I10 Essential (primary) hypertension: Secondary | ICD-10-CM

## 2021-06-10 DIAGNOSIS — M545 Low back pain, unspecified: Secondary | ICD-10-CM | POA: Diagnosis not present

## 2021-06-17 ENCOUNTER — Ambulatory Visit: Payer: Medicare HMO | Admitting: Family Medicine

## 2021-06-21 ENCOUNTER — Other Ambulatory Visit: Payer: Self-pay | Admitting: Family Medicine

## 2021-06-21 DIAGNOSIS — K2970 Gastritis, unspecified, without bleeding: Secondary | ICD-10-CM

## 2021-06-21 NOTE — Telephone Encounter (Signed)
Refilled 05/28/2021 #[id number] with 3 refills. Requested Prescriptions  Pending Prescriptions Disp Refills   omeprazole (PRILOSEC) 40 MG capsule [Pharmacy Med Name: OMEPRAZOLE DR 40 MG CAPSULE] 90 capsule 3    Sig: TAKE 1 CAPSULE (40 MG TOTAL) BY MOUTH DAILY AS NEEDED (ACID REFLUX/INDIGESTION).     Gastroenterology: Proton Pump Inhibitors Passed - 06/21/2021  1:24 AM      Passed - Valid encounter within last 12 months    Recent Outpatient Visits          2 months ago Essential hypertension   [facility] [name], MD   4 months ago Cellulitis of great toe of left foot   [facility] [name], MD   5 months ago Dog bite, subsequent encounter   [place], PA-C   10 months ago Left hip pain   [place], Kelby Aline, FNP   1 year ago Subacute frontal sinusitis   [place], PA-C      Future Appointments            In 1 month Thedore Mins, Ria Comment, PA-C [employer], [place]   In 3 months Hollice Espy, MD [place]

## 2021-06-22 ENCOUNTER — Encounter: Payer: Self-pay | Admitting: Emergency Medicine

## 2021-06-22 ENCOUNTER — Ambulatory Visit
Admission: EM | Admit: 2021-06-22 | Discharge: 2021-06-22 | Disposition: A | Discharge status: Home / Self Care | Payer: Medicare HMO | Attending: Family Medicine | Admitting: Family Medicine

## 2021-06-22 ENCOUNTER — Other Ambulatory Visit: Payer: Self-pay

## 2021-06-22 DIAGNOSIS — H9313 Tinnitus, bilateral: Secondary | ICD-10-CM | POA: Diagnosis not present

## 2021-06-22 DIAGNOSIS — I10 Essential (primary) hypertension: Secondary | ICD-10-CM

## 2021-06-22 MED ORDER — AMLODIPINE BESYLATE 10 MG PO TABS: 10.0000 mg | ORAL_TABLET | Freq: Every day | ORAL | 1 refills | Status: DC

## 2021-06-22 NOTE — ED Provider Notes (Signed)
[facility]    CSN: [id number] Arrival date & time: 06/22/21  1044      History   Chief Complaint Chief Complaint  Patient presents with   Tinnitus    HPI  66 year old male with a history of prostate cancer, GERD, hypertension, AAA, OSA presents for evaluation of tenderness.    Patient reports tinnitus which she has had for the past few weeks.  Seems to be worsening and becoming more prominent.  Affects both ears.  Additionally, patient's blood pressure has been uncontrolled.  He has recently been started on amlodipine.  BP significantly elevated here.  Denies any other associated symptoms.  No other complaints.   Past Medical History:  Diagnosis Date   Arthritis    GERD (gastroesophageal reflux disease)    Hypertension    Prostate cancer ([place])    Sleep apnea    no CPAP    Patient Active Problem List   Diagnosis Date Noted   AAA (abdominal aortic aneurysm) 08/29/2020   Palpitations 03/29/2020   PVC's (premature ventricular contractions) 03/29/2020   Shortness of breath 03/29/2020   Postoperative ileus ([place]) 10/15/2018   Right lower quadrant abdominal pain    Prostate cancer ([place]) 10/11/2018   Lumbar sprain 09/04/2017   Traumatic rupture of quadriceps tendon 09/02/2017   Bursitis of hip 05/31/2015   ED (erectile dysfunction) of organic origin 11/18/2014   Essential hypertension 11/18/2014   Neuropathy 11/18/2014   Allergic rhinitis 11/14/2014   Arthropathia 11/26/2009   History of tobacco use 07/31/2008   Acid reflux 07/26/2008   Arthritis, degenerative 07/26/2008   Apnea, sleep 06/30/2008    Past Surgical History:  Procedure Laterality Date   APPENDECTOMY     At age 69   CATARACT EXTRACTION W/PHACO Left 08/08/2020   Procedure: CATARACT EXTRACTION PHACO AND INTRAOCULAR LENS PLACEMENT (IOC) LEFT 5.62 01:00.4 903%;  Surgeon: Leandrew Koyanagi, MD;  Location: [facility];  Service: Ophthalmology;  Laterality: Left;   HEMORROIDECTOMY   1930   PELVIC LYMPH NODE DISSECTION Bilateral 10/11/2018   Procedure: PELVIC LYMPH NODE DISSECTION;  Surgeon: Hollice Espy, MD;  Location: [facility];  Service: Urology;  Laterality: Bilateral;   QUADRICEPS TENDON REPAIR Left 09/09/2017   Procedure: REPAIR QUADRICEP TENDON;  Surgeon: Earnestine Leys, MD;  Location: [facility];  Service: Orthopedics;  Laterality: Left;   ROBOT ASSISTED LAPAROSCOPIC RADICAL PROSTATECTOMY N/A 10/11/2018   Procedure: ROBOTIC ASSISTED LAPAROSCOPIC RADICAL PROSTATECTOMY;  Surgeon: Hollice Espy, MD;  Location: [facility];  Service: Urology;  Laterality: N/A;       Home Medications    Prior to Admission medications   Medication Sig Start Date End Date Taking? Authorizing Provider  amLODipine (NORVASC) 10 MG tablet Take 1 tablet (10 mg total) by mouth daily. 06/22/21   Coral Spikes, DO  gabapentin (NEURONTIN) 300 MG capsule gabapentin 300 mg capsule    [provider]  ibuprofen (ADVIL) 200 MG tablet Take 400 mg by mouth every 8 (eight) hours as needed for mild pain.    [provider]  lisinopril (ZESTRIL) 40 MG tablet TAKE 1 TABLET EVERY DAY 04/19/21   [name], MD  meloxicam [facility]) 15 MG tablet TAKE 1 TABLET EVERY DAY 04/19/21   [name], MD  metoprolol succinate (TOPROL-XL) 25 MG 24 hr tablet TAKE 1 TABLET EVERY DAY 05/28/21   [name], MD  Multiple Vitamin (MULTIVITAMIN WITH MINERALS) TABS tablet Take 1 tablet by mouth daily.    [provider]  omeprazole (PRILOSEC) 40 MG capsule TAKE 1 CAPSULE (40 MG TOTAL) BY MOUTH DAILY AS NEEDED (ACID REFLUX/ INDIGESTION). 05/28/21   [name], MD  traMADol [facility]at[facility]) 50 MG tablet tramadol 50 mg tablet  TAKE 1 TABLET BY MOUTH EVERY DAY AS NEEDED    [provider]    Family History Family History  Problem Relation Age of Onset   Heart attack Mother    Stroke Father    Asthma Father    Cancer Brother    Diabetes Brother    Prostate  cancer Neg Hx    Kidney cancer Neg Hx    Bladder Cancer Neg Hx     Social History Social History   Tobacco Use   Smoking status: Some Days    Types: Cigars   Smokeless tobacco: Never  Vaping Use   Vaping Use: Never used  Substance Use Topics   Alcohol use: Yes    Alcohol/week: 2.0 standard drinks    Types: 2 Cans of beer per week    Comment: occasionally   Drug use: No     Allergies   Patient has no known allergies.   Review of Systems Review of Systems Per HPI  Physical Exam Triage Vital Signs ED Triage Vitals  Enc Vitals Group     BP 06/22/21 1053 (!) 166/100     Pulse Rate 06/22/21 1053 65     Resp --      Temp 06/22/21 1053 98.4 F (36.9 C)     Temp Source 06/22/21 1053 Oral     SpO2 06/22/21 1053 99 %     Weight --      Height --      Head Circumference --      Peak Flow --      Pain Score 06/22/21 1050 1     Pain Loc --      Pain Edu? --      Excl. in [place]? --    No data found.  Updated Vital Signs BP (!) 166/100 (BP Location: Left Arm)    Pulse 65    Temp 98.4 F (36.9 C) (Oral)    SpO2 99%   Visual Acuity Right Eye Distance:   Left Eye Distance:   Bilateral Distance:    Right Eye Near:   Left Eye Near:    Bilateral Near:     Physical Exam Vitals and nursing note reviewed.  Constitutional:      General: He is not in acute distress.    Appearance: Normal appearance. He is not ill-appearing.  HENT:     Head: Normocephalic and atraumatic.     Right Ear: Tympanic membrane normal.     Left Ear: Tympanic membrane normal.  Cardiovascular:     Rate and Rhythm: Normal rate and regular rhythm.  Pulmonary:     Effort: Pulmonary effort is normal.     Breath sounds: Normal breath sounds.  Neurological:     Mental Status: He is alert.  Psychiatric:        Mood and Affect: Mood normal.        Behavior: Behavior normal.     UC Treatments / Results  Labs (all labs ordered are listed, but only abnormal results are displayed) Labs Reviewed  - No data to display  EKG   Radiology No results found.  Procedures Procedures (including critical care time)  Medications Ordered in UC Medications - No data to display  Initial Impression / Assessment  and Plan / UC Course  I have reviewed the triage vital signs and the nursing notes.  Pertinent labs & imaging results that were available during my care of the patient were reviewed by me and considered in my medical decision making (see chart for details).    66 year old male presents with uncontrolled hypertension as well as tenderness.  Increasing Norvasc to 10 mg daily.  ENT exam was negative.  Advised follow-up with PCP as he needs referral to ENT for audiological evaluation.  Final Clinical Impressions(s) / UC Diagnoses   Final diagnoses:  Tinnitus of both ears  Essential hypertension     Discharge Instructions      I have increased your amlodipine to 10 mg daily.  Ask PCP for referral to ENT for evaluation of the tinnitus.  Take care  Dr. Lacinda Axon    ED Prescriptions     Medication Sig Dispense Auth. Provider   amLODipine (NORVASC) 10 MG tablet Take 1 tablet (10 mg total) by mouth daily. 90 tablet Thersa Salt G, DO      PDMP not reviewed this encounter.   Coral Spikes, [place] 06/22/21 1204

## 2021-06-22 NOTE — Discharge Instructions (Signed)
I have increased your amlodipine to 10 mg daily.  Ask PCP for referral to ENT for evaluation of the tinnitus.  Take care  Dr. Lacinda Axon

## 2021-06-22 NOTE — ED Triage Notes (Signed)
Pt c/o ringing in both ears x 1 week. Pt states he has been having headaches as well.

## 2021-06-25 DIAGNOSIS — I10 Essential (primary) hypertension: Secondary | ICD-10-CM | POA: Diagnosis not present

## 2021-06-25 DIAGNOSIS — H9313 Tinnitus, bilateral: Secondary | ICD-10-CM | POA: Diagnosis not present

## 2021-06-25 DIAGNOSIS — J019 Acute sinusitis, unspecified: Secondary | ICD-10-CM | POA: Diagnosis not present

## 2021-06-25 DIAGNOSIS — J209 Acute bronchitis, unspecified: Secondary | ICD-10-CM | POA: Diagnosis not present

## 2021-06-27 ENCOUNTER — Other Ambulatory Visit: Payer: Self-pay

## 2021-06-27 ENCOUNTER — Ambulatory Visit (INDEPENDENT_AMBULATORY_CARE_PROVIDER_SITE_OTHER): Payer: Medicare HMO | Admitting: Physician Assistant

## 2021-06-27 VITALS — BP 165/85 | HR 66 | Temp 98.4°F | Wt 233.0 lb

## 2021-06-27 DIAGNOSIS — Z716 Tobacco abuse counseling: Secondary | ICD-10-CM | POA: Diagnosis not present

## 2021-06-27 DIAGNOSIS — H9313 Tinnitus, bilateral: Secondary | ICD-10-CM

## 2021-06-27 DIAGNOSIS — I10 Essential (primary) hypertension: Secondary | ICD-10-CM | POA: Diagnosis not present

## 2021-06-27 MED ORDER — HYDROCHLOROTHIAZIDE 12.5 MG PO CAPS: 12.5000 mg | ORAL_CAPSULE | Freq: Every day | ORAL | 2 refills | Status: DC

## 2021-06-27 NOTE — Progress Notes (Signed)
Acute Office Visit  Subjective:    Patient ID: Jack Weaver, male    DOB: Jan 04, 1956, 66 y.o.   MRN: [id number]  Cc. Ringing in ears   HPI Patient is in today for evaluation of ringing and buzzing in his ears.  He states that he was seen at Urgent Care 5 days ago and was given antibiotics for sinus infection.  They also increased his Amlodipine from 5 mg daily to 10 mg daily as he was hypertensive. He reports only starting his antibiotic yesterday, has only taken two doses so far. Denies improvement.  Describes tinnitus as sometimes quiet, sometimes loud, and low pitched. Denies changes in hearing. Reports he recently got a new space for drum practice, and it is much louder than usual.  Reports returning to smoking d/t weight gain, smokes 1 cigar daily.   Past Medical History:  Diagnosis Date   Arthritis    GERD (gastroesophageal reflux disease)    Hypertension    Prostate cancer ([place])    Sleep apnea    no CPAP    Past Surgical History:  Procedure Laterality Date   APPENDECTOMY     At age 45   CATARACT EXTRACTION W/PHACO Left 08/08/2020   Procedure: CATARACT EXTRACTION PHACO AND INTRAOCULAR LENS PLACEMENT (IOC) LEFT 5.62 01:00.4 903%;  Surgeon: Leandrew Koyanagi, MD;  Location: [place] [facility];  Service: Ophthalmology;  Laterality: Left;   HEMORROIDECTOMY  1930   PELVIC LYMPH NODE DISSECTION Bilateral 10/11/2018   Procedure: PELVIC LYMPH NODE DISSECTION;  Surgeon: Hollice Espy, MD;  Location: [facility];  Service: Urology;  Laterality: Bilateral;   QUADRICEPS TENDON REPAIR Left 09/09/2017   Procedure: REPAIR QUADRICEP TENDON;  Surgeon: Earnestine Leys, MD;  Location: [facility];  Service: Orthopedics;  Laterality: Left;   ROBOT ASSISTED LAPAROSCOPIC RADICAL PROSTATECTOMY N/A 10/11/2018   Procedure: ROBOTIC ASSISTED LAPAROSCOPIC RADICAL PROSTATECTOMY;  Surgeon: Hollice Espy, MD;  Location: [facility];  Service: Urology;  Laterality: N/A;    Family History  Problem  Relation Age of Onset   Heart attack Mother    Stroke Father    Asthma Father    Cancer Brother    Diabetes Brother    Prostate cancer Neg Hx    Kidney cancer Neg Hx    Bladder Cancer Neg Hx     Social History   Socioeconomic History   Marital status: Married    Spouse name: Not on file   Number of children: Not on file   Years of education: Not on file   Highest education level: Not on file  Occupational History   Not on file  Tobacco Use   Smoking status: Some Days    Types: Cigars   Smokeless tobacco: Never  Vaping Use   Vaping Use: Never used  Substance and Sexual Activity   Alcohol use: Yes    Alcohol/week: 2.0 standard drinks    Types: 2 Cans of beer per week    Comment: occasionally   Drug use: No   Sexual activity: Not on file  Other Topics Concern   Not on file  Social History Narrative   Not on file   Social Determinants of Health   Financial Resource Strain: Not on file  Food Insecurity: Not on file  Transportation Needs: Not on file  Physical Activity: Not on file  Stress: Not on file  Social Connections: Not on file  Intimate Partner Violence: Not on file    Outpatient Medications Prior to Visit  Medication  Dispense Refill  ° amLODipine (NORVASC) 10 MG tablet Take 1 tablet (10 mg total) by mouth daily. 90 tablet 1  ° gabapentin (NEURONTIN) 300 MG capsule gabapentin 300 mg capsule    ° ibuprofen (ADVIL) 200 MG tablet Take 400 mg by mouth every 8 (eight) hours as needed for mild pain.    ° lisinopril (ZESTRIL) 40 MG tablet TAKE 1 TABLET EVERY DAY 90 tablet 3  ° meloxicam (MOBIC) 15 MG tablet TAKE 1 TABLET EVERY DAY 90 tablet 3  ° metoprolol succinate (TOPROL-XL) 25 MG 24 hr tablet TAKE 1 TABLET EVERY DAY 90 tablet 1  ° Multiple Vitamin (MULTIVITAMIN WITH MINERALS) TABS tablet Take 1 tablet by mouth daily.    ° omeprazole (PRILOSEC) 40 MG capsule TAKE 1 CAPSULE (40 MG TOTAL) BY MOUTH DAILY AS NEEDED (ACID REFLUX/ INDIGESTION). 90 capsule 3  ° traMADol  (ULTRAM) 50 MG tablet tramadol 50 mg tablet ° TAKE 1 TABLET BY MOUTH EVERY DAY AS NEEDED    ° °No facility-administered medications prior to visit.  ° ° °No Known Allergies ° °Review of Systems  °HENT:  Positive for congestion, ear pain, postnasal drip, rhinorrhea, sinus pressure, sinus pain and tinnitus. Negative for hearing loss and sore throat.   °Respiratory:  Negative for shortness of breath.   °Cardiovascular:  Negative for chest pain.  °Neurological:  Negative for headaches.  ° °   °Objective:  °  °Physical Exam °Constitutional:   °   General: He is awake.  °   Appearance: He is well-developed.  °HENT:  °   Head: Normocephalic.  °   Right Ear: Tympanic membrane normal.  °   Left Ear: Tympanic membrane normal.  °   Nose: Congestion present.  °Eyes:  °   Conjunctiva/sclera: Conjunctivae normal.  °Cardiovascular:  °   Rate and Rhythm: Normal rate and regular rhythm.  °   Heart sounds: Normal heart sounds.  °Pulmonary:  °   Effort: Pulmonary effort is normal.  °   Breath sounds: Normal breath sounds.  °Skin: °   General: Skin is warm.  °Neurological:  °   Mental Status: He is alert and oriented to person, place, and time.  °Psychiatric:     °   Attention and Perception: Attention normal.     °   Mood and Affect: Mood normal.     °   Speech: Speech normal.     °   Behavior: Behavior is cooperative.  ° ° °BP (!) 165/85 (BP Location: Left Arm, Patient Position: Sitting, Cuff Size: Large)    Pulse 66    Temp 98.4 °F (36.9 °C) (Oral)    Wt 233 lb (105.7 kg)    SpO2 100%    BMI 32.50 kg/m²  °Wt Readings from Last 3 Encounters:  °06/27/21 233 lb (105.7 kg)  °04/11/21 228 lb 6.4 oz (103.6 kg)  °03/26/21 229 lb (103.9 kg)  ° ° °Health Maintenance Due  °Topic Date Due  ° Zoster Vaccines- Shingrix (1 of 2) Never done  ° COLONOSCOPY (Pts 45-49yrs Insurance coverage will need to be confirmed)  06/27/2014  ° Pneumonia Vaccine 65+ Years old (2 - PCV) 11/24/2019  ° COVID-19 Vaccine (5 - Booster for Pfizer series) 11/22/2020  °  INFLUENZA VACCINE  12/24/2020  ° ° °There are no preventive care reminders to display for this patient. ° ° °Lab Results  °Component Value Date  ° TSH 1.330 03/28/2020  ° °Lab Results  °Component Value Date  ° WBC   4.2 08/23/2020  ° HGB 14.2 08/23/2020  ° HCT 42.8 08/23/2020  ° MCV 83 08/23/2020  ° PLT 232 08/23/2020  ° °Lab Results  °Component Value Date  ° NA 140 03/27/2021  ° K 4.4 03/27/2021  ° CO2 22 03/27/2021  ° GLUCOSE 100 (H) 03/27/2021  ° BUN 13 03/27/2021  ° CREATININE 1.10 03/27/2021  ° BILITOT 0.3 08/23/2020  ° ALKPHOS 125 (H) 08/23/2020  ° AST 34 08/23/2020  ° ALT 26 08/23/2020  ° PROT 6.9 08/23/2020  ° ALBUMIN 4.5 03/27/2021  ° CALCIUM 9.7 03/27/2021  ° ANIONGAP 10 08/02/2019  ° EGFR 74 03/27/2021  ° °Lab Results  °Component Value Date  ° CHOL 167 07/02/2016  ° °Lab Results  °Component Value Date  ° HDL 63 07/02/2016  ° °Lab Results  °Component Value Date  ° LDLCALC 86 07/02/2016  ° °Lab Results  °Component Value Date  ° TRIG 90 07/02/2016  ° °No results found for: CHOLHDL °Lab Results  °Component Value Date  ° HGBA1C 5.7 (H) 09/04/2017  ° ° °   °Assessment & Plan:  ° °Problem List Items Addressed This Visit   ° °  ° Cardiovascular and Mediastinum  ° Essential hypertension  °  Unusually elevated recently. °Pt is continuing with Amlodipine at 10 mg. °Today, adding HCTZ 12.5 mg, which may not make a large diff in BP, but may help tinnitus sxs as well, see below.  °If HTN benefit is seen, consider increasing as needed.  °Advised f/u with cardio as HTN polypharm w/ continued rise in BP °  °  ° Relevant Medications  ° hydrochlorothiazide (MICROZIDE) 12.5 MG capsule  ° Other Relevant Orders  ° Ambulatory referral to Cardiology  °  ° Other  ° Tinnitus of both ears - Primary  °  Advised multiple possible etiology --- sinusitis, exposure to loud music, related to elevation of BP °-Advised continue w/ abx to treat sinusitis °-avoid similar music exposure for the next 1-2 weeks to see if this resolves °-add hctz  12.5 to see if this helps at all w/ symptoms, and see htn note for ref to cardio ° °--also will refer to ENT in case of relation to hearing/auditory issue °  °  ° Relevant Orders  ° Ambulatory referral to ENT  ° Encounter for smoking cessation counseling  °  Pt admits to returning to smoking as it helps him control his weight °Will address at future visit ° °  °  °  ° ° °I,Elena D DeSanto,acting as a scribe for  , PA-C.,have documented all relevant documentation on the behalf of  , PA-C,as directed by   , PA-C while in the presence of  , PA-C. ° °I,  , PA-C have reviewed all documentation for this visit. The documentation on  06/27/2021  for the exam, diagnosis, procedures, and orders are all accurate and complete.  ° ° , PA-C °[facility] °[address] °[place], [place], [address] °Office: [phone] °Fax: [phone]  °

## 2021-06-28 DIAGNOSIS — H9313 Tinnitus, bilateral: Secondary | ICD-10-CM | POA: Insufficient documentation

## 2021-06-28 DIAGNOSIS — Z716 Tobacco abuse counseling: Secondary | ICD-10-CM | POA: Insufficient documentation

## 2021-06-28 NOTE — Assessment & Plan Note (Signed)
Advised multiple possible etiology --- sinusitis, exposure to loud music, related to elevation of BP -Advised continue w/ abx to treat sinusitis -avoid similar music exposure for the next 1-2 weeks to see if this resolves -add hctz 12.5 to see if this helps at all w/ symptoms, and see htn note for ref to cardio  --also will refer to ENT in case of relation to hearing/auditory issue

## 2021-06-28 NOTE — Assessment & Plan Note (Signed)
Pt admits to returning to smoking as it helps him control his weight Will address at future visit

## 2021-06-28 NOTE — Assessment & Plan Note (Addendum)
Unusually elevated recently. Pt recorded a 190s/110 at home (proved on machine he brought in), which brought him to ED, where it was 160s/100. Denies use of OTC cold meds which could raise BP Pt is continuing with Amlodipine at 10 mg. Improved today with 165/85 at highest Today, adding HCTZ 12.5 mg, which may not make a large diff in BP, but may help tinnitus sxs as well, see below.  If HTN benefit is seen, consider increasing as needed.   Advised f/u with cardio ASAP as HTN polypharm w/ continued rise in BP  Gave instructions on when to go to ED w/ elevated BP and ass. Symptoms of weakness, headache, changes in vision, chest pain, dizziness, etc.

## 2021-07-01 ENCOUNTER — Other Ambulatory Visit: Payer: Self-pay

## 2021-07-01 ENCOUNTER — Emergency Department
Admission: EM | Admit: 2021-07-01 | Discharge: 2021-07-01 | Disposition: A | Discharge status: Home / Self Care | Payer: Medicare HMO | Attending: Emergency Medicine | Admitting: Emergency Medicine

## 2021-07-01 ENCOUNTER — Emergency Department: Payer: Medicare HMO

## 2021-07-01 DIAGNOSIS — Z8546 Personal history of malignant neoplasm of prostate: Secondary | ICD-10-CM | POA: Diagnosis not present

## 2021-07-01 DIAGNOSIS — Z79890 Hormone replacement therapy (postmenopausal): Secondary | ICD-10-CM | POA: Diagnosis not present

## 2021-07-01 DIAGNOSIS — J320 Chronic maxillary sinusitis: Secondary | ICD-10-CM | POA: Diagnosis not present

## 2021-07-01 DIAGNOSIS — I10 Essential (primary) hypertension: Secondary | ICD-10-CM | POA: Insufficient documentation

## 2021-07-01 DIAGNOSIS — Z791 Long term (current) use of non-steroidal anti-inflammatories (NSAID): Secondary | ICD-10-CM | POA: Diagnosis not present

## 2021-07-01 DIAGNOSIS — E041 Nontoxic single thyroid nodule: Secondary | ICD-10-CM | POA: Diagnosis not present

## 2021-07-01 DIAGNOSIS — Z79899 Other long term (current) drug therapy: Secondary | ICD-10-CM | POA: Diagnosis not present

## 2021-07-01 DIAGNOSIS — H9313 Tinnitus, bilateral: Secondary | ICD-10-CM

## 2021-07-01 DIAGNOSIS — R29818 Other symptoms and signs involving the nervous system: Secondary | ICD-10-CM | POA: Diagnosis not present

## 2021-07-01 DIAGNOSIS — I6523 Occlusion and stenosis of bilateral carotid arteries: Secondary | ICD-10-CM | POA: Diagnosis not present

## 2021-07-01 LAB — BASIC METABOLIC PANEL
Anion gap: 5 (ref 5–15)
BUN: 23 mg/dL (ref 8–23)
CO2: 24 mmol/L (ref 22–32)
Calcium: 9.1 mg/dL (ref 8.9–10.3)
Chloride: 110 mmol/L (ref 98–111)
Creatinine, Ser: 1.09 mg/dL (ref 0.61–1.24)
GFR, Estimated: >60 mL/min (ref >60)
Glucose, Bld: 115 mg/dL — ABNORMAL HIGH (ref 70–99)
Potassium: 3.8 mmol/L (ref 3.5–5.1)
Sodium: 139 mmol/L (ref 135–145)

## 2021-07-01 LAB — CBC
HCT: 40.6 % (ref 39.0–52.0)
Hemoglobin: 13.3 g/dL (ref 13.0–17.0)
MCH: 28 pg (ref 26.0–34.0)
MCHC: 32.8 g/dL (ref 30.0–36.0)
MCV: 85.5 fL (ref 80.0–100.0)
Platelets: 201 10*3/uL (ref 150–400)
RBC: 4.75 MIL/uL (ref 4.22–5.81)
RDW: 13.2 % (ref 11.5–15.5)
WBC: 3.8 10*3/uL — ABNORMAL LOW (ref 4.0–10.5)
nRBC: 0 % (ref 0.0–0.2)

## 2021-07-01 LAB — SALICYLATE LEVEL: Salicylate Lvl: <7 mg/dL — ABNORMAL LOW (ref 7.0–30.0)

## 2021-07-01 MED ORDER — IOHEXOL 350 MG/ML SOLN
75.0000 mL | Freq: Once | INTRAVENOUS | Status: AC | PRN
  Administered 2021-07-01: 75 mL via INTRAVENOUS

## 2021-07-01 MED ORDER — METHYLPREDNISOLONE 4 MG PO TBPK: ORAL_TABLET | ORAL | 0 refills | Status: DC

## 2021-07-01 NOTE — ED Notes (Signed)
No orders at this time per MD Quentin Cornwall.

## 2021-07-01 NOTE — Discharge Instructions (Addendum)
Stop meloxicam, do not take any other NSAID (ibuprofen, aspirin, naprosyn etc). Take steroids as prescribed. Follow up with ENT.

## 2021-07-01 NOTE — ED Triage Notes (Signed)
Pt presents to ER c/o ringing in BIL ears x3 weeks.  Pt states he has been seen 3x about this same issue, given meds without relief.  Pt states he currently has a HA.  Pt A&O x4 at this time, no slurred speech or other neurological symptoms.

## 2021-07-01 NOTE — ED Provider Notes (Signed)
[facility] Provider Note    Event Date/Time   First MD Initiated Contact with Patient 07/01/21 0209     (approximate)   History   Tinnitus   HPI  Jack Weaver is a 66 y.o. male with a history of prostate cancer on hormonal therapy, sleep apnea not on CPAP, hypertension and GERD who presents for evaluation of tinnitus.  Patient reports constant nonpulsatile bilateral tinnitus for 3 weeks.  Unclear etiology at this time.  He does take meloxicam 1 pill a day every day.  No other new medications.  Initially saw his primary care doctor who put him on some antibiotics in case this was caused by sinus infection.  No changes to his symptoms.  He has been to to urgent cares and a PCP with no diagnosis.  He has been referred to ENT but has been unable to schedule an appointment that is not 3 to 4 months in the future.  Patient reports that he is unable to sleep due to the tinnitus.  He is extremely overwhelmed by the constant symptoms.  No hearing loss     Past Medical History:  Diagnosis Date   Arthritis    GERD (gastroesophageal reflux disease)    Hypertension    Prostate cancer ([place])    Sleep apnea    no CPAP    Past Surgical History:  Procedure Laterality Date   APPENDECTOMY     At age 61   CATARACT EXTRACTION W/PHACO Left 08/08/2020   Procedure: CATARACT EXTRACTION PHACO AND INTRAOCULAR LENS PLACEMENT (IOC) LEFT 5.62 01:00.4 903%;  Surgeon: Leandrew Koyanagi, MD;  Location: [place];  Service: Ophthalmology;  Laterality: Left;   HEMORROIDECTOMY  1930   PELVIC LYMPH NODE DISSECTION Bilateral 10/11/2018   Procedure: PELVIC LYMPH NODE DISSECTION;  Surgeon: Hollice Espy, MD;  Location: [facility];  Service: Urology;  Laterality: Bilateral;   QUADRICEPS TENDON REPAIR Left 09/09/2017   Procedure: REPAIR QUADRICEP TENDON;  Surgeon: Earnestine Leys, MD;  Location: [facility];  Service: Orthopedics;  Laterality: Left;   ROBOT ASSISTED LAPAROSCOPIC  RADICAL PROSTATECTOMY N/A 10/11/2018   Procedure: ROBOTIC ASSISTED LAPAROSCOPIC RADICAL PROSTATECTOMY;  Surgeon: Hollice Espy, MD;  Location: [facility];  Service: Urology;  Laterality: N/A;     Physical Exam   Triage Vital Signs: ED Triage Vitals  Enc Vitals Group     BP 07/01/21 0107 (!) 173/82     Pulse Rate 07/01/21 0107 63     Resp 07/01/21 0107 18     Temp 07/01/21 0107 98.7 F (37.1 C)     Temp Source 07/01/21 0107 Oral     SpO2 07/01/21 0107 100 %     Weight 07/01/21 0108 222 lb (100.7 kg)     Height 07/01/21 0108 5\' 11"  (1.803 m)     Head Circumference --      Peak Flow --      Pain Score 07/01/21 0107 3     Pain Loc --      Pain Edu? --      Excl. in [place]? --     Most recent vital signs: Vitals:   07/01/21 0107 07/01/21 0528  BP: (!) 173/82 (!) 164/84  Pulse: 63 70  Resp: 18 18  Temp: 98.7 F (37.1 C) 98.9 F (37.2 C)  SpO2: 100% 98%     Constitutional: Alert and oriented. Well appearing and in no apparent distress. HEENT:      Head: Normocephalic and atraumatic.  Eyes: Conjunctivae are normal. Sclera is non-icteric.       Ears: b/l TMs visualized and clear      Mouth/Throat: Mucous membranes are moist.       Neck: Supple with no signs of meningismus. Cardiovascular: Regular rate and rhythm. No murmurs, gallops, or rubs. 2+ symmetrical distal pulses are present in all extremities.  Respiratory: Normal respiratory effort. Lungs are clear to auscultation bilaterally.  Gastrointestinal: Soft, non tender. Musculoskeletal:  No edema, cyanosis, or erythema of extremities. Neurologic: Normal speech and language. Face is symmetric. Moving all extremities. No gross focal neurologic deficits are appreciated. Skin: Skin is warm, dry and intact. No rash noted. Psychiatric: Mood and affect are normal. Speech and behavior are normal.  ED Results / Procedures / Treatments   Labs (all labs ordered are listed, but only abnormal results are displayed) Labs  Reviewed  CBC - Abnormal; Notable for the following components:      Result Value   WBC 3.8 (*)    All other components within normal limits  BASIC METABOLIC PANEL - Abnormal; Notable for the following components:   Glucose, Bld 115 (*)    All other components within normal limits  SALICYLATE LEVEL - Abnormal; Notable for the following components:   Salicylate Lvl <2.3 (*)    All other components within normal limits     EKG  none   RADIOLOGY I, Rudene Re, attending MD, have personally viewed and interpreted the images obtained during this visit as below:  CT head and neck negative for acute pathology   ___________________________________________________ Interpretation by Radiologist:  CT ANGIO HEAD NECK W WO CM  Result Date: 07/01/2021 CLINICAL DATA:  66 year old male neurologic deficit, bilateral tinnitus for 3 weeks. Headache. EXAM: CT ANGIOGRAPHY HEAD AND NECK TECHNIQUE: Multidetector CT imaging of the head and neck was performed using the standard protocol during bolus administration of intravenous contrast. Multiplanar CT image reconstructions and MIPs were obtained to evaluate the vascular anatomy. Carotid stenosis measurements (when applicable) are obtained utilizing NASCET criteria, using the distal internal carotid diameter as the denominator. RADIATION DOSE REDUCTION: This exam was performed according to the departmental dose-optimization program which includes automated exposure control, adjustment of the mA and/or kV according to patient size and/or use of iterative reconstruction technique. CONTRAST:  53mL OMNIPAQUE IOHEXOL 350 MG/ML SOLN COMPARISON:  Brain MRI and head CT 08/02/2019. FINDINGS: CT HEAD Brain: Cerebral volume is stable and within normal limits for age. No midline shift, ventriculomegaly, mass effect, evidence of mass lesion, intracranial hemorrhage or evidence of cortically based acute infarction. Mild for age scattered mostly subcortical white matter  patchy hypodensity appears stable. No cortical encephalomalacia identified. Calvarium and skull base: No acute osseous abnormality identified. Paranasal sinuses: Chronic left maxillary sinusitis with mucoperiosteal thickening and nearly airless sinus. Tympanic cavities and mastoids remain clear. Other paranasal sinuses remain clear. Orbits: Postoperative changes to the left globe since 2021. No acute orbit or scalp soft tissue findings. CTA NECK Skeleton: Mild for age cervical spine degeneration at most levels. No acute osseous abnormality identified. Upper chest: Paraseptal emphysema in the lung apices. Small volume retained secretions in the carina and bilateral mainstem bronchi. But the visible upper lungs are clear. No superior mediastinal lymphadenopathy. Other neck: Subcentimeter hypodense right thyroid nodule. Not clinically significant; no follow-up imaging recommended (ref: J Am Coll Radiol. 2015 Feb;12(2): 143-50).Glottis is closed. Otherwise negative CT appearance of the neck soft tissues. Aortic arch: 3 vessel arch configuration. Mild arch atherosclerosis. Right carotid system: Minor  right CCA and right carotid bifurcation atherosclerosis with no stenosis. Left carotid system: Minimal left CCA atherosclerosis. Mild calcified plaque at the left ICA origin. Mild soft and calcified plaque at the bulb. No stenosis. Vertebral arteries: Tortuous proximal right subclavian artery with a kinked appearance, but without plaque or stenosis. Normal right vertebral artery origin. Non dominant right vertebral artery is diminutive but remains patent to the skull base. Negative proximal left subclavian artery, left vertebral artery origin. Dominant left vertebral artery is patent to the skull base without plaque or stenosis. CTA HEAD Posterior circulation: Dominant left vertebral V4 segment, the diminutive right vertebral continues to the vertebrobasilar junction. Normal left PICA origin. Dominant appearing right AICA.  No distal vertebral plaque or stenosis. Patent although diminutive basilar artery, with a fetal type origin of bilateral PCAs. Patent basilar tip, SCA origins. Bilateral PCA branches are within normal limits. Anterior circulation: Both ICA siphons are patent. No siphon plaque or stenosis. Normal ophthalmic and posterior communicating artery origins. Patent carotid termini, MCA and ACA origins. Diminutive or absent anterior communicating artery. Bilateral ACA branches are within normal limits. Left MCA M1 segment and bifurcation are patent without stenosis. Right MCA M1 segment and trifurcation are patent without stenosis. Bilateral MCA branches are within normal limits. No abnormal vascularity at the internal auditory regions identified. Venous sinuses: Patent. Anatomic variants: Fetal type bilateral PCA origins with diminutive vertebrobasilar system, and dominant left vertebral artery. Review of the MIP images confirms the above findings IMPRESSION: 1. Negative for large vessel occlusion. Mild for age atherosclerosis in the head and neck with no arterial stenosis. 2. No acute intracranial abnormality. Stable mild for age nonspecific cerebral white matter changes. 3. Chronic left maxillary sinusitis. Middle ears and mastoids are clear. 4. Apical emphysema (ICD10-J43.9), with small volume retained secretions in the carina and bilateral mainstem bronchi. Electronically Signed   By: Genevie Ann M.D.   On: 07/01/2021 04:49      PROCEDURES:  Critical Care performed: No  Procedures    IMPRESSION / MDM / ASSESSMENT AND PLAN / ED COURSE  I reviewed the triage vital signs and the nursing notes.  66 y.o. male with a history of prostate cancer on hormonal therapy, sleep apnea not on CPAP, hypertension and GERD who presents for evaluation of constant bilateral non pulsatile tinnitus x 3 weeks  Ddx: Inner ear inflammation, medication side effects, cervical arterial AVM/ aneurysm, eustachian tube malfunction,  schwannoma,    Plan: Will get CTA of the head and neck, salicylate level, basic labs   MEDICATIONS GIVEN IN ED: Medications  iohexol (OMNIPAQUE) 350 MG/ML injection 75 mL (75 mLs Intravenous Contrast Given 07/01/21 0412)     ED COURSE: CT angio of the head and neck with no acute pathology.  Recommended stopping meloxicam.  Referral sent to ENT.  I will put patient on a short course of a Medrol taper in case this is vestibular in nature.  Discussed my standard return precautions.  No indication for admission.   Consults: None   EMR reviewed including last visit with patient's PCP from 4 days ago for the same complaint    FINAL CLINICAL IMPRESSION(S) / ED DIAGNOSES   Final diagnoses:  Tinnitus of both ears     Rx / DC Orders   ED Discharge Orders          Ordered    methylPREDNISolone (MEDROL DOSEPAK) 4 MG TBPK tablet        07/01/21 0459    Ambulatory referral to  ENT       Comments: Constant bilateral nonpulsatile tinnitus x 3 weeks   07/01/21 0500             Note:  This document was prepared using Dragon voice recognition software and may include unintentional dictation errors.   Please note:  Patient was evaluated in Emergency Department today for the symptoms described in the history of present illness. Patient was evaluated in the context of the global COVID-19 pandemic, which necessitated consideration that the patient might be at risk for infection with the SARS-CoV-2 virus that causes COVID-19. Institutional protocols and algorithms that pertain to the evaluation of patients at risk for COVID-19 are in a state of rapid change based on information released by regulatory bodies including the CDC and federal and state organizations. These policies and algorithms were followed during the patient's care in the ED.  Some ED evaluations and interventions may be delayed as a result of limited staffing during the pandemic.       Alfred Levins, [place], MD 07/01/21  [phone]

## 2021-07-08 DIAGNOSIS — H9313 Tinnitus, bilateral: Secondary | ICD-10-CM | POA: Diagnosis not present

## 2021-07-08 DIAGNOSIS — J343 Hypertrophy of nasal turbinates: Secondary | ICD-10-CM | POA: Diagnosis not present

## 2021-07-08 DIAGNOSIS — H903 Sensorineural hearing loss, bilateral: Secondary | ICD-10-CM | POA: Diagnosis not present

## 2021-07-18 DIAGNOSIS — H9313 Tinnitus, bilateral: Secondary | ICD-10-CM | POA: Diagnosis not present

## 2021-07-22 DIAGNOSIS — H903 Sensorineural hearing loss, bilateral: Secondary | ICD-10-CM | POA: Diagnosis not present

## 2021-07-22 DIAGNOSIS — H9313 Tinnitus, bilateral: Secondary | ICD-10-CM | POA: Diagnosis not present

## 2021-07-23 ENCOUNTER — Emergency Department
Admission: EM | Admit: 2021-07-23 | Discharge: 2021-07-23 | Disposition: A | Discharge status: Home / Self Care | Payer: Medicare HMO | Attending: Emergency Medicine | Admitting: Emergency Medicine

## 2021-07-23 ENCOUNTER — Ambulatory Visit (INDEPENDENT_AMBULATORY_CARE_PROVIDER_SITE_OTHER): Payer: Medicare HMO | Admitting: Physician Assistant

## 2021-07-23 ENCOUNTER — Other Ambulatory Visit: Payer: Self-pay

## 2021-07-23 ENCOUNTER — Ambulatory Visit: Payer: Medicare HMO | Admitting: Family Medicine

## 2021-07-23 ENCOUNTER — Emergency Department: Payer: Medicare HMO

## 2021-07-23 ENCOUNTER — Encounter: Payer: Self-pay | Admitting: Physician Assistant

## 2021-07-23 VITALS — BP 148/74 | HR 59

## 2021-07-23 DIAGNOSIS — N50812 Left testicular pain: Secondary | ICD-10-CM

## 2021-07-23 DIAGNOSIS — Z5321 Procedure and treatment not carried out due to patient leaving prior to being seen by health care provider: Secondary | ICD-10-CM

## 2021-07-23 DIAGNOSIS — I861 Scrotal varices: Secondary | ICD-10-CM | POA: Diagnosis not present

## 2021-07-23 DIAGNOSIS — N433 Hydrocele, unspecified: Secondary | ICD-10-CM | POA: Diagnosis not present

## 2021-07-23 DIAGNOSIS — N451 Epididymitis: Secondary | ICD-10-CM | POA: Diagnosis not present

## 2021-07-23 DIAGNOSIS — Z8546 Personal history of malignant neoplasm of prostate: Secondary | ICD-10-CM | POA: Diagnosis not present

## 2021-07-23 LAB — COMPREHENSIVE METABOLIC PANEL
ALT: 19 U/L (ref 0–44)
AST: 25 U/L (ref 15–41)
Albumin: 4.1 g/dL (ref 3.5–5.0)
Alkaline Phosphatase: 102 U/L (ref 38–126)
Anion gap: 7 (ref 5–15)
BUN: 15 mg/dL (ref 8–23)
CO2: 23 mmol/L (ref 22–32)
Calcium: 9.2 mg/dL (ref 8.9–10.3)
Chloride: 108 mmol/L (ref 98–111)
Creatinine, Ser: 0.9 mg/dL (ref 0.61–1.24)
GFR, Estimated: >60 mL/min (ref >60)
Glucose, Bld: 159 mg/dL — ABNORMAL HIGH (ref 70–99)
Potassium: 3.9 mmol/L (ref 3.5–5.1)
Sodium: 138 mmol/L (ref 135–145)
Total Bilirubin: 0.5 mg/dL (ref 0.3–1.2)
Total Protein: 6.5 g/dL (ref 6.5–8.1)

## 2021-07-23 LAB — CBC WITH DIFFERENTIAL/PLATELET
Abs Immature Granulocytes: 0.02 10*3/uL (ref 0.00–0.07)
Basophils Absolute: 0 10*3/uL (ref 0.0–0.1)
Basophils Relative: 1 %
Eosinophils Absolute: 0 10*3/uL (ref 0.0–0.5)
Eosinophils Relative: 1 %
HCT: 38.2 % — ABNORMAL LOW (ref 39.0–52.0)
Hemoglobin: 12.4 g/dL — ABNORMAL LOW (ref 13.0–17.0)
Immature Granulocytes: 1 %
Lymphocytes Relative: 44 %
Lymphs Abs: 1.6 10*3/uL (ref 0.7–4.0)
MCH: 27.3 pg (ref 26.0–34.0)
MCHC: 32.5 g/dL (ref 30.0–36.0)
MCV: 84 fL (ref 80.0–100.0)
Monocytes Absolute: 0.3 10*3/uL (ref 0.1–1.0)
Monocytes Relative: 9 %
Neutro Abs: 1.7 10*3/uL (ref 1.7–7.7)
Neutrophils Relative %: 44 %
Platelets: 201 10*3/uL (ref 150–400)
RBC: 4.55 MIL/uL (ref 4.22–5.81)
RDW: 13.2 % (ref 11.5–15.5)
WBC: 3.8 10*3/uL — ABNORMAL LOW (ref 4.0–10.5)
nRBC: 0 % (ref 0.0–0.2)

## 2021-07-23 LAB — URINALYSIS, ROUTINE W REFLEX MICROSCOPIC
Bilirubin Urine: NEGATIVE
Glucose, UA: NEGATIVE mg/dL
Hgb urine dipstick: NEGATIVE
Ketones, ur: NEGATIVE mg/dL
Leukocytes,Ua: NEGATIVE
Nitrite: NEGATIVE
Protein, ur: NEGATIVE mg/dL
Specific Gravity, Urine: 1.019 (ref 1.005–1.030)
pH: 5 (ref 5.0–8.0)

## 2021-07-23 LAB — CHLAMYDIA/NGC RT PCR (ARMC ONLY)
Chlamydia Tr: NOT DETECTED
N gonorrhoeae: NOT DETECTED

## 2021-07-23 MED ORDER — ONDANSETRON HCL 4 MG/2ML IJ SOLN
4.0000 mg | Freq: Once | INTRAMUSCULAR | Status: AC
  Administered 2021-07-23: 4 mg via INTRAVENOUS
  Filled 2021-07-23: qty 2

## 2021-07-23 MED ORDER — ONDANSETRON 4 MG PO TBDP
4.0000 mg | ORAL_TABLET | Freq: Once | ORAL | Status: AC
  Administered 2021-07-23: 4 mg via ORAL
  Filled 2021-07-23: qty 1

## 2021-07-23 MED ORDER — LEVOFLOXACIN 500 MG PO TABS
500.0000 mg | ORAL_TABLET | Freq: Every day | ORAL | 0 refills | Status: AC
Start: 2021-07-23 — End: 2021-08-02

## 2021-07-23 MED ORDER — FENTANYL CITRATE PF 50 MCG/ML IJ SOSY
100.0000 ug | PREFILLED_SYRINGE | Freq: Once | INTRAMUSCULAR | Status: AC
  Administered 2021-07-23: 100 ug via INTRAVENOUS
  Filled 2021-07-23: qty 2

## 2021-07-23 MED ORDER — OXYCODONE-ACETAMINOPHEN 5-325 MG PO TABS: 1.0000 | ORAL_TABLET | Freq: Four times a day (QID) | ORAL | 0 refills | Status: DC | PRN

## 2021-07-23 MED ORDER — OXYCODONE-ACETAMINOPHEN 5-325 MG PO TABS
2.0000 | ORAL_TABLET | Freq: Once | ORAL | Status: AC
  Administered 2021-07-23: 2 via ORAL
  Filled 2021-07-23: qty 2

## 2021-07-23 MED ORDER — KETOROLAC TROMETHAMINE 60 MG/2ML IM SOLN
60.0000 mg | Freq: Once | INTRAMUSCULAR | Status: AC
  Administered 2021-07-23: 60 mg via INTRAMUSCULAR
  Filled 2021-07-23: qty 2

## 2021-07-23 MED ORDER — IBUPROFEN 600 MG PO TABS: 600.0000 mg | ORAL_TABLET | Freq: Four times a day (QID) | ORAL | 0 refills | Status: DC | PRN

## 2021-07-23 NOTE — ED Triage Notes (Signed)
Pt c/o left testicular pain since this morning, pt is not able to stand straight up due to pain

## 2021-07-23 NOTE — ED Provider Notes (Signed)
[facility] Provider Note    Event Date/Time   First MD Initiated Contact with Patient 07/23/21 1534     (approximate)   History   Testicle Pain   HPI  Jack Weaver is a 66 y.o. male here with left testicle pain.  The patient states that for the last 24 hours, he has had progressively worsening and now severe, 10 out of 10, left testicle pain.  The pain is severe, worse with any kind of movement.  He was essentially unable to work today.  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  He also lifts things heavily at work.  He states he also has a history of prostate issues and has had his prostate removed and is on hormone treatment for prostate cancer.  Denies history of UTIs.  Denies heterosexual activity.  No fevers or chills.  No flank pain.  No history of epididymitis or other infections.     Physical Exam   Triage Vital Signs: ED Triage Vitals  Enc Vitals Group     BP 07/23/21 1434 (!) 143/77     Pulse Rate 07/23/21 1434 60     Resp 07/23/21 1434 20     Temp 07/23/21 1434 98.1 F (36.7 C)     Temp Source 07/23/21 1434 Oral     SpO2 07/23/21 1434 98 %     Weight --      Height --      Head Circumference --      Peak Flow --      Pain Score 07/23/21 1543 7     Pain Loc --      Pain Edu? --      Excl. in [place]? --     Most recent vital signs: Vitals:   07/23/21 1612 07/23/21 1750  BP: 140/71 (!) 141/78  Pulse: 75   Resp: 17 17  Temp: 98.1 F (36.7 C) 98.5 F (36.9 C)  SpO2: 98% 96%     General: Awake, no distress.  CV:  Good peripheral perfusion.  Resp:  Normal effort.  Abd:  No distention.  Other:  Testes descended bilaterally.  Marked tenderness over the left posterior testicle without overt swelling or edema.  No scrotal swelling or induration.   ED Results / Procedures / Treatments   Labs (all labs ordered are listed, but only abnormal results are displayed) Labs Reviewed  CBC WITH DIFFERENTIAL/PLATELET -  Abnormal; Notable for the following components:      Result Value   WBC 3.8 (*)    Hemoglobin 12.4 (*)    HCT 38.2 (*)    All other components within normal limits  COMPREHENSIVE METABOLIC PANEL - Abnormal; Notable for the following components:   Glucose, Bld 159 (*)    All other components within normal limits  URINALYSIS, ROUTINE W REFLEX MICROSCOPIC - Abnormal; Notable for the following components:   Color, Urine YELLOW (*)    APPearance HAZY (*)    All other components within normal limits  CHLAMYDIA/NGC RT PCR ([facility] ONLY)            URINE CULTURE     EKG    RADIOLOGY Ultrasound: Increased vascular flow in the left epididymis suggesting epididymitis, bilateral hydrocele   I also independently reviewed and agree wit radiologist interpretations.   PROCEDURES:  Critical Care performed: No     MEDICATIONS ORDERED IN ED: Medications  fentaNYL (SUBLIMAZE) injection 100 mcg (100 mcg Intravenous Given  07/23/21 1456)  ondansetron (ZOFRAN) injection 4 mg (4 mg Intravenous Given 07/23/21 1456)  ketorolac (TORADOL) injection 60 mg (60 mg Intramuscular Given 07/23/21 1641)  oxyCODONE-acetaminophen (PERCOCET/ROXICET) 5-325 MG per tablet 2 tablet (2 tablets Oral Given 07/23/21 1640)  ondansetron (ZOFRAN-ODT) disintegrating tablet 4 mg (4 mg Oral Given 07/23/21 1641)     IMPRESSION / MDM / ASSESSMENT AND PLAN / ED COURSE  I reviewed the triage vital signs and the nursing notes.                               MDM:  66 year old male here with left testicular pain.  Exam as above, patient has no evidence to suggest torsion.  Ultrasound obtained, reviewed, is consistent with likely epididymitis.  No evidence of torsion.  Patient does not appear septic.  UA negative for UTI.  Urine gonorrhea and chlamydia negative.  CMP negative.  Denies high risk sexual activity.  Will treat for epididymitis with Levaquin p.o., NSAIDs, and outpatient follow-up.  He sees Dr. Owens Shark as an  outpatient.   MEDICATIONS GIVEN IN ED: Medications  fentaNYL (SUBLIMAZE) injection 100 mcg (100 mcg Intravenous Given 07/23/21 1456)  ondansetron (ZOFRAN) injection 4 mg (4 mg Intravenous Given 07/23/21 1456)  ketorolac (TORADOL) injection 60 mg (60 mg Intramuscular Given 07/23/21 1641)  oxyCODONE-acetaminophen (PERCOCET/ROXICET) 5-325 MG per tablet 2 tablet (2 tablets Oral Given 07/23/21 1640)  ondansetron (ZOFRAN-ODT) disintegrating tablet 4 mg (4 mg Oral Given 07/23/21 1641)     Consults:     EMR reviewed  Prior ED visits     FINAL CLINICAL IMPRESSION(S) / ED DIAGNOSES   Final diagnoses:  Epididymitis     Rx / DC Orders   ED Discharge Orders          Ordered    levofloxacin (LEVAQUIN) 500 MG tablet  Daily        07/23/21 1739    oxyCODONE-acetaminophen (PERCOCET) 5-325 MG tablet  Every 6 hours PRN        07/23/21 1739    ibuprofen (ADVIL) 600 MG tablet  Every 6 hours PRN        07/23/21 1739             Note:  This document was prepared using Dragon voice recognition software and may include unintentional dictation errors.   Duffy Bruce, MD 07/24/21 [phone]

## 2021-07-23 NOTE — ED Notes (Signed)
Pt follow up urology info provided, all questions answered

## 2021-07-23 NOTE — Progress Notes (Signed)
° °  I,Sha'taria Tyson,acting as a Education administrator for [employer], PA-C.,have documented all relevant documentation on the behalf of Mikey Kirschner, PA-C,as directed by  Mikey Kirschner, PA-C while in the presence of Mikey Kirschner, PA-C.  Established Patient Office Visit  Subjective:  Patient ID: Jack Weaver, male    DOB: August 16, 1955  Age: 66 y.o. MRN: [id number]  Mr Ohms was not seen by a provider today. Today's visit was for HTN follow up but in the waiting room Mr Cuadra was endorsing severe groin pain. He arrived an hour before his appointment time due to this pain. I was with another patient at the time and advised my MA and the front desk staff through secure chat that if he couldn't wait until I was finished with my current patient to be seen, he should go to the emergency room for care.   Pt was brought back by MA and BP was taken, at which point pt decided to leave and go to the emergency room.   Past Medical History:  Diagnosis Date   Arthritis    GERD (gastroesophageal reflux disease)    Hypertension    Prostate cancer ([place])    Sleep apnea    no CPAP    Outpatient Medications Prior to Visit  Medication Sig Dispense Refill   amLODipine (NORVASC) 10 MG tablet Take 1 tablet (10 mg total) by mouth daily. 90 tablet 1   gabapentin (NEURONTIN) 300 MG capsule gabapentin 300 mg capsule     hydrochlorothiazide (MICROZIDE) 12.5 MG capsule Take 1 capsule (12.5 mg total) by mouth daily. 30 capsule 2   ibuprofen (ADVIL) 200 MG tablet Take 400 mg by mouth every 8 (eight) hours as needed for mild pain.     lisinopril (ZESTRIL) 40 MG tablet TAKE 1 TABLET EVERY DAY 90 tablet 3   methylPREDNISolone (MEDROL DOSEPAK) 4 MG TBPK tablet Take as prescribed 21 tablet 0   metoprolol succinate (TOPROL-XL) 25 MG 24 hr tablet TAKE 1 TABLET EVERY DAY 90 tablet 1   Multiple Vitamin (MULTIVITAMIN WITH MINERALS) TABS tablet Take 1 tablet by mouth daily.     omeprazole (PRILOSEC) 40 MG capsule TAKE 1 CAPSULE (40 MG  TOTAL) BY MOUTH DAILY AS NEEDED (ACID REFLUX/ INDIGESTION). 90 capsule 3   traMADol (ULTRAM) 50 MG tablet tramadol 50 mg tablet  TAKE 1 TABLET BY MOUTH EVERY DAY AS NEEDED     No facility-administered medications prior to visit.    No Known Allergies    Objective:   Blood pressure (!) 148/74, pulse (!) 59.    Assessment & Plan:   Disposition was to be seen in the emergency room. Pt did not wait for evaluation from a provider.  Mikey Kirschner, PA-C [facility] [address] [place], [place], [address] Office: [phone] Fax: [phone]

## 2021-07-25 LAB — URINE CULTURE: Culture: <10000 — AB

## 2021-08-05 DIAGNOSIS — R430 Anosmia: Secondary | ICD-10-CM | POA: Diagnosis not present

## 2021-08-05 DIAGNOSIS — H6121 Impacted cerumen, right ear: Secondary | ICD-10-CM | POA: Diagnosis not present

## 2021-08-05 DIAGNOSIS — H9319 Tinnitus, unspecified ear: Secondary | ICD-10-CM | POA: Diagnosis not present

## 2021-08-05 DIAGNOSIS — J301 Allergic rhinitis due to pollen: Secondary | ICD-10-CM | POA: Diagnosis not present

## 2021-08-07 ENCOUNTER — Other Ambulatory Visit: Payer: Self-pay

## 2021-08-07 ENCOUNTER — Encounter: Payer: Self-pay | Admitting: Family Medicine

## 2021-08-07 ENCOUNTER — Ambulatory Visit (INDEPENDENT_AMBULATORY_CARE_PROVIDER_SITE_OTHER): Payer: Medicare HMO | Admitting: Family Medicine

## 2021-08-07 VITALS — BP 111/74 | HR 58 | Temp 98.1°F | Resp 16 | Ht 72.0 in | Wt 232.0 lb

## 2021-08-07 DIAGNOSIS — Z8546 Personal history of malignant neoplasm of prostate: Secondary | ICD-10-CM | POA: Diagnosis not present

## 2021-08-07 DIAGNOSIS — I7143 Infrarenal abdominal aortic aneurysm, without rupture: Secondary | ICD-10-CM

## 2021-08-07 DIAGNOSIS — I10 Essential (primary) hypertension: Secondary | ICD-10-CM

## 2021-08-07 DIAGNOSIS — N50812 Left testicular pain: Secondary | ICD-10-CM | POA: Diagnosis not present

## 2021-08-07 DIAGNOSIS — H9313 Tinnitus, bilateral: Secondary | ICD-10-CM

## 2021-08-07 NOTE — Progress Notes (Signed)
?  ? ? ?Established patient visit ? ? ?Patient: Jack Weaver   DOB: 02/14/1956   66 y.o. Male  MRN: [id number] ?Visit Date: 08/07/2021 ? ?Today's healthcare provider: Wilhemena Durie, MD  ? ?Chief Complaint  ?Patient presents with  ? Hospitalization Follow-up  ? ?Subjective  ?  ?HPI  ?Patient comes in today for follow-up of left testicular discomfort/epididymitis.  He is improving.  It is now a discomfort not a pain. ?He is by Dr. [name] for prostate cancer. ?He states that his tinnitus continues to be an issue for him but he is comfortable with it. ?Overall he is doing well.  He continues to work.  He now has 5 granddaughters. ?Follow up ER visit ? ?Patient was seen in ER for Testicle Pain on 07/23/2021. ?He was treated for Testicle Pain. ?Treatment for this included see notes in chart. ?He reports good compliance with treatment. ?He reports this condition is Unchanged. ? ?----------------------------------------------------------------------------------------- ? ?Patient completed antibiotic and states he feels improved. Still having some pain in testicle. ? ?Medications: ?Outpatient Medications Prior to Visit  ?Medication Sig  ? amLODipine (NORVASC) 10 MG tablet Take 1 tablet (10 mg total) by mouth daily.  ? gabapentin (NEURONTIN) 300 MG capsule gabapentin 300 mg capsule  ? ibuprofen (ADVIL) 600 MG tablet Take 1 tablet (600 mg total) by mouth every 6 (six) hours as needed for moderate pain.  ? lisinopril (ZESTRIL) 40 MG tablet TAKE 1 TABLET EVERY DAY  ? metoprolol succinate (TOPROL-XL) 25 MG 24 hr tablet TAKE 1 TABLET EVERY DAY  ? Multiple Vitamin (MULTIVITAMIN WITH MINERALS) TABS tablet Take 1 tablet by mouth daily.  ? omeprazole (PRILOSEC) 40 MG capsule TAKE 1 CAPSULE (40 MG TOTAL) BY MOUTH DAILY AS NEEDED (ACID REFLUX/ INDIGESTION).  ? oxyCODONE-acetaminophen (PERCOCET) 5-325 MG tablet Take 1-2 tablets by mouth every 6 (six) hours as needed for severe pain or moderate pain (no more than 6 tabs daily).  ?  [DISCONTINUED] hydrochlorothiazide (MICROZIDE) 12.5 MG capsule Take 1 capsule (12.5 mg total) by mouth daily.  ? [DISCONTINUED] methylPREDNISolone (MEDROL DOSEPAK) 4 MG TBPK tablet Take as prescribed  ? ?No facility-administered medications prior to visit.  ? ? ?Review of Systems  ?Constitutional:  Negative for appetite change, chills and fever.  ?Respiratory:  Negative for chest tightness, shortness of breath and wheezing.   ?Cardiovascular:  Negative for chest pain and palpitations.  ?Gastrointestinal:  Negative for abdominal pain, nausea and vomiting.  ? ?  ?  Objective  ?  ?BP 111/74 (BP Location: Left Arm, Patient Position: Sitting, Cuff Size: Large)   Pulse (!) 58   Temp 98.1 ?F (36.7 ?C) (Temporal)   Resp 16   Ht 6' (1.829 m)   Wt 232 lb (105.2 kg)   SpO2 95%   BMI 31.46 kg/m?  ?BP Readings from Last 3 Encounters:  ?08/07/21 111/74  ?07/23/21 (!) 141/78  ?07/23/21 (!) 148/74  ? ?Wt Readings from Last 3 Encounters:  ?08/07/21 232 lb (105.2 kg)  ?07/01/21 222 lb (100.7 kg)  ?06/27/21 233 lb (105.7 kg)  ? ?  ? ?Physical Exam ?Vitals reviewed.  ?Constitutional:   ?   General: He is awake.  ?   Appearance: He is well-developed.  ?HENT:  ?   Head: Normocephalic.  ?   Right Ear: Tympanic membrane normal.  ?   Left Ear: Tympanic membrane normal.  ?   Nose: Congestion present.  ?Eyes:  ?   Conjunctiva/sclera: Conjunctivae normal.  ?Cardiovascular:  ?  Rate and Rhythm: Normal rate and regular rhythm.  ?   Heart sounds: Normal heart sounds.  ?Pulmonary:  ?   Effort: Pulmonary effort is normal.  ?   Breath sounds: Normal breath sounds.  ?Genitourinary: ?   Penis: Normal.   ?   Testes: Normal.  ?Skin: ?   General: Skin is warm.  ?Neurological:  ?   Mental Status: He is alert and oriented to person, place, and time.  ?Psychiatric:     ?   Attention and Perception: Attention normal.     ?   Mood and Affect: Mood normal.     ?   Speech: Speech normal.     ?   Behavior: Behavior normal. Behavior is cooperative.     ?    Thought Content: Thought content normal.     ?   Judgment: Judgment normal.  ?  ? ? ?No results found for any visits on 08/07/21. ? Assessment & Plan  ?  ? ?1. Pain in left testicle/epididymitis ?Clinically markedly improved.  It was not a torsion with ultrasound done. ? ?2. Tinnitus of both ears ?Been evaluated by ENT. ? ?3. Essential hypertension ?Very good control today. ? ?4. Infrarenal abdominal aortic aneurysm (AAA) without rupture ?Needs follow-up ultrasound ordered. ? ? ?No follow-ups on file.  ?   ? ?I, Wilhemena Durie, MD, have reviewed all documentation for this visit. The documentation on 08/10/21 for the exam, diagnosis, procedures, and orders are all accurate and complete. ? ? ? ?Neftali Thurow Cranford Mon, MD  ?[facility] ?[phone] (phone) ?[phone] (fax) ? ?[facility] ?

## 2021-08-12 ENCOUNTER — Other Ambulatory Visit: Payer: Self-pay

## 2021-08-12 DIAGNOSIS — I7143 Infrarenal abdominal aortic aneurysm, without rupture: Secondary | ICD-10-CM

## 2021-08-15 ENCOUNTER — Other Ambulatory Visit: Payer: Self-pay

## 2021-08-15 ENCOUNTER — Ambulatory Visit: Payer: Medicare HMO | Admitting: Medical

## 2021-08-15 ENCOUNTER — Encounter: Payer: Self-pay | Admitting: Medical

## 2021-08-15 VITALS — BP 140/80 | HR 53 | Ht 71.0 in | Wt 231.4 lb

## 2021-08-15 DIAGNOSIS — I10 Essential (primary) hypertension: Secondary | ICD-10-CM | POA: Diagnosis not present

## 2021-08-15 MED ORDER — CARVEDILOL 25 MG PO TABS: 25.0000 mg | ORAL_TABLET | Freq: Two times a day (BID) | ORAL | 3 refills | Status: DC

## 2021-08-15 NOTE — Progress Notes (Signed)
?Cardiology Office Note:   ? ?Date:  08/15/2021  ? ?ID:  Jack Weaver, DOB Feb 15, 1956, MRN [id number] ? ?PCP:  [name], MD  ?[facility] Cardiologist:  Nelva Bush, MD  ?[facility] Electrophysiologist:  None  ? ?Referring MD: [name],*  ? ?Chief Complaint: HTN ? ?History of Present Illness:   ? ?Jack Weaver is a 66 y.o. male with a hx of HTN, prostate cancer s/p prostatectomy, arthritis who presents for HTN follow-up.  ? ?Seen in 2021 for palpitations and PVCs. Heart monitor showed NSR, average Hr 57 with brief episodes of PSVT, NSVT and rare PVCs/bigeminy. Echo showed LVEF 55-60%, mod LVH, G2DD. Metoprolol improved palpitations. BP was high but patient didn't want to start another medication.  ? ?Today, patient reports elevated BP. He was started on amlodipine and this has improved BP. Today blood pressure is 140/80. BP at home is the same. The patient denies chest pain, SOB, LLE, orthopnea, pnd. No dizziness or lightheadedness. Has some ringing in his ears, PCP is following. He exercises every day. Has gained weight with the hormones for cancer, which has contributed to elevated BP. Heart rate is 53bpm on EKG.  ? ?Past Medical History:  ?Diagnosis Date  ? Arthritis   ? GERD (gastroesophageal reflux disease)   ? Hypertension   ? Prostate cancer ([place])   ? Sleep apnea   ? no CPAP  ? ? ?Past Surgical History:  ?Procedure Laterality Date  ? APPENDECTOMY    ? At age 58  ? CATARACT EXTRACTION W/PHACO Left 08/08/2020  ? Procedure: CATARACT EXTRACTION PHACO AND INTRAOCULAR LENS PLACEMENT (IOC) LEFT 5.62 01:00.4 903%;  Surgeon: Leandrew Koyanagi, MD;  Location: [place];  Service: Ophthalmology;  Laterality: Left;  ? HEMORROIDECTOMY  1930  ? PELVIC LYMPH NODE DISSECTION Bilateral 10/11/2018  ? Procedure: PELVIC LYMPH NODE DISSECTION;  Surgeon: Hollice Espy, MD;  Location: [facility];  Service: Urology;  Laterality: Bilateral;  ? QUADRICEPS TENDON REPAIR Left 09/09/2017   ? Procedure: REPAIR QUADRICEP TENDON;  Surgeon: Earnestine Leys, MD;  Location: [facility];  Service: Orthopedics;  Laterality: Left;  ? ROBOT ASSISTED LAPAROSCOPIC RADICAL PROSTATECTOMY N/A 10/11/2018  ? Procedure: ROBOTIC ASSISTED LAPAROSCOPIC RADICAL PROSTATECTOMY;  Surgeon: Hollice Espy, MD;  Location: [facility];  Service: Urology;  Laterality: N/A;  ? ? ?Current Medications: ?Current Meds  ?Medication Sig  ? amLODipine (NORVASC) 10 MG tablet Take 1 tablet (10 mg total) by mouth daily.  ? carvedilol (COREG) 25 MG tablet Take 1 tablet (25 mg total) by mouth 2 (two) times daily.  ? fluticasone (FLONASE) 50 MCG/ACT nasal spray Place 2 sprays into both nostrils daily.  ? gabapentin (NEURONTIN) 300 MG capsule gabapentin 300 mg capsule  ? ibuprofen (ADVIL) 600 MG tablet Take 1 tablet (600 mg total) by mouth every 6 (six) hours as needed for moderate pain.  ? lisinopril (ZESTRIL) 40 MG tablet TAKE 1 TABLET EVERY DAY  ? Multiple Vitamin (MULTIVITAMIN WITH MINERALS) TABS tablet Take 1 tablet by mouth daily.  ? omeprazole (PRILOSEC) 40 MG capsule TAKE 1 CAPSULE (40 MG TOTAL) BY MOUTH DAILY AS NEEDED (ACID REFLUX/ INDIGESTION).  ? oxyCODONE-acetaminophen (PERCOCET) 5-325 MG tablet Take 1-2 tablets by mouth every 6 (six) hours as needed for severe pain or moderate pain (no more than 6 tabs daily).  ? [DISCONTINUED] metoprolol succinate (TOPROL-XL) 25 MG 24 hr tablet TAKE 1 TABLET EVERY DAY  ?  ? ?Allergies:   Patient has no known allergies.  ? ?  Social History  ? ?Socioeconomic History  ? Marital status: Married  ?  Spouse name: Not on file  ? Number of children: Not on file  ? Years of education: Not on file  ? Highest education level: Not on file  ?Occupational History  ? Not on file  ?Tobacco Use  ? Smoking status: Some Days  ?  Types: Cigars  ? Smokeless tobacco: Never  ?Vaping Use  ? Vaping Use: Never used  ?Substance and Sexual Activity  ? Alcohol use: Yes  ?  Alcohol/week: 2.0 standard drinks  ?  Types: 2 Cans of beer  per week  ?  Comment: occasionally  ? Drug use: No  ? Sexual activity: Not on file  ?Other Topics Concern  ? Not on file  ?Social History Narrative  ? Not on file  ? ?Social Determinants of Health  ? ?Financial Resource Strain: Not on file  ?Food Insecurity: Not on file  ?Transportation Needs: Not on file  ?Physical Activity: Not on file  ?Stress: Not on file  ?Social Connections: Not on file  ?  ? ?Family History: ?The patient's family history includes Asthma in his father; Cancer in his brother; Diabetes in his brother; Heart attack in his mother; Stroke in his father. There is no history of Prostate cancer, Kidney cancer, or Bladder Cancer. ? ?ROS:   ?Please see the history of present illness.    ? All other systems reviewed and are negative. ? ?EKGs/Labs/Other Studies Reviewed:   ? ?The following studies were reviewed today: ? ?Echo 04/17/20 ? ? 1. Left ventricular ejection fraction, by estimation, is 55 to 60%. The  ?left ventricle has normal function. The left ventricle has no regional  ?wall motion abnormalities. There is moderate left ventricular hypertrophy.  ?Left ventricular diastolic  ?parameters are consistent with Grade II diastolic dysfunction  ?(pseudonormalization). The average left ventricular global longitudinal  ?strain is -16.3 %. The global longitudinal strain is abnormal.  ? 2. Right ventricular systolic function is normal. The right ventricular  ?size is mildly enlarged. There is normal pulmonary artery systolic  ?pressure.  ? 3. Left atrial size was moderately dilated.  ? 4. Right atrial size was mildly dilated.  ? 5. The mitral valve is normal in structure. Mild to moderate mitral valve  ?regurgitation. No evidence of mitral stenosis.  ? 6. Tricuspid valve regurgitation is mild to moderate.  ? 7. The aortic valve is tricuspid. There is mild calcification of the  ?aortic valve. There is moderate thickening of the aortic valve. Aortic  ?valve regurgitation is not visualized. Mild to moderate  aortic valve  ?sclerosis/calcification is present, without  ?any evidence of aortic stenosis.  ? 8. The inferior vena cava is normal in size with <50% respiratory  ?variability, suggesting right atrial pressure of 8 mmHg.  ?  ?Heart monitor 03/2020 ?  ?The patient was monitored for 6 days, 17 hours. ?The predominant rhythm was sinus with an average rate of 56 bpm (range 42-132 bpm in sinus). ?There were rare PACs and occasional PVCs (PVC burden 3.6%). ?10 atrial runs lasting up to 16 beats with a maximum rate of 179 bpm were observed. A single 6 beat run of nonsustained ventricular tachycardia also occurred. ?No sustained arrhythmia or prolonged pause was identified. ?There were 117 patient triggered events during the monitoring period, corresponding to sinus rhythm, PACs, PVCs (including ventricular bigeminy), PSVT, and NSVT. ?  ?Predominately sinus rhythm with occasional PVCs and rare PACs.  Brief episodes of PSVT  and NSVT also noted.  Numerous patient triggered events recorded, many of which correspond to ventricular bigeminy. ? ?EKG:  EKG is  ordered today.  The ekg ordered today demonstrates SB 53bpm, nonspecific T wave changes ? ?Recent Labs: ?07/23/2021: ALT 19; BUN 15; Creatinine, Ser 0.90; Hemoglobin 12.4; Platelets 201; Potassium 3.9; Sodium 138  ?Recent Lipid Panel ?   ?Component Value Date/Time  ? CHOL 167 07/02/2016 0813  ? CHOL 178 06/22/2011 0025  ? TRIG 90 07/02/2016 0813  ? TRIG 109 06/22/2011 0025  ? HDL 63 07/02/2016 0813  ? HDL 46 06/22/2011 0025  ? VLDL 22 06/22/2011 0025  ? [place] 86 07/02/2016 0813  ? [place] 110 (H) 06/22/2011 0025  ? ? ? ?Physical Exam:   ? ?VS:  BP 140/80 (BP Location: Left Arm, Patient Position: Sitting, Cuff Size: Normal)   Pulse (!) 53   Ht '5\' 11"'$  (1.803 m)   Wt 231 lb 6 oz (105 kg)   SpO2 98%   BMI 32.27 kg/m?    ? ?Wt Readings from Last 3 Encounters:  ?08/15/21 231 lb 6 oz (105 kg)  ?08/07/21 232 lb (105.2 kg)  ?07/01/21 222 lb (100.7 kg)  ?  ? ?GEN:  Well  nourished, well developed in no acute distress ?HEENT: Normal ?NECK: No JVD; No carotid bruits ?LYMPHATICS: No lymphadenopathy ?CARDIAC: bradycardia, RR, no murmurs, rubs, gallops ?RESPIRATORY:  Clear to ausculta

## 2021-08-15 NOTE — Patient Instructions (Signed)
Medication Instructions:  ?Your physician has recommended you make the following change in your medication:  ? ?STOP taking Toprol-XL ? ?START taking carvedilol (Coreg) 25 mg twice daily  ? ?*If you need a refill on your cardiac medications before your next appointment, please call your pharmacy* ? ? ?Lab Work: ?None ordered ? ?If you have labs (blood work) drawn today and your tests are completely normal, you will receive your results only by: ?MyChart Message (if you have MyChart) OR ?A paper copy in the mail ?If you have any lab test that is abnormal or we need to change your treatment, we will call you to review the results. ? ? ?Testing/Procedures: ?None ordered ? ? ?Follow-Up: ?At [facility], you and your health needs are our priority.  As part of our continuing mission to provide you with exceptional heart care, we have created designated Provider Care Teams.  These Care Teams include your primary Cardiologist (physician) and Advanced Practice Providers (APPs -  Physician Assistants and Nurse Practitioners) who all work together to provide you with the care you need, when you need it. ? ?We recommend signing up for the patient portal called "MyChart".  Sign up information is provided on this After Visit Summary.  MyChart is used to connect with patients for Virtual Visits (Telemedicine).  Patients are able to view lab/test results, encounter notes, upcoming appointments, etc.  Non-urgent messages can be sent to your provider as well.   ?To learn more about what you can do with MyChart, go to [email or web address].   ? ?Your next appointment:   ?1 month(s) ? ?The format for your next appointment:   ?In Person ? ?Provider:   ?You may see Nelva Bush, MD or one of the following Advanced Practice Providers on your designated Care Team:   ?Murray Hodgkins, NP ?Christell Faith, PA-C ?Cadence Kathlen Mody, PA-C ? ? ?Other Instructions ?N/A ?

## 2021-09-10 ENCOUNTER — Ambulatory Visit: Payer: Medicare HMO

## 2021-09-11 DIAGNOSIS — L720 Epidermal cyst: Secondary | ICD-10-CM | POA: Diagnosis not present

## 2021-09-16 ENCOUNTER — Ambulatory Visit (INDEPENDENT_AMBULATORY_CARE_PROVIDER_SITE_OTHER): Payer: Medicare HMO

## 2021-09-16 VITALS — BP 138/90 | HR 64 | Ht 71.0 in | Wt 233.4 lb

## 2021-09-16 DIAGNOSIS — Z0000 Encounter for general adult medical examination without abnormal findings: Secondary | ICD-10-CM | POA: Diagnosis not present

## 2021-09-16 NOTE — Patient Instructions (Signed)
Jack Weaver , ?Thank you for taking time to come for your Medicare Wellness Visit. I appreciate your ongoing commitment to your health goals. Please review the following plan we discussed and let me know if I can assist you in the future.  ? ?Screening recommendations/referrals: ?Colonoscopy: wants to wait until he sees MD for CPE in Sept. ?Recommended yearly ophthalmology/optometry visit for glaucoma screening and checkup ?Recommended yearly dental visit for hygiene and checkup ? ?Vaccinations: ?Influenza vaccine: n/d ?Pneumococcal vaccine: 11/24/18 ?Tdap vaccine: 12/31/20 ?Shingles vaccine: n/d   ?Covid-19: 07/07/19, 08/03/19, 03/11/20, 09/27/20 ? ?Advanced directives: yes, copy requested ? ?Conditions/risks identified: none ? ?Next appointment: Follow up in one year for your annual wellness visit. 09/18/22 @ 1pm in person ? ?Preventive Care 68 Years and Older, Male ?Preventive care refers to lifestyle choices and visits with your health care provider that can promote health and wellness. ?What does preventive care include? ?A yearly physical exam. This is also called an annual well check. ?Dental exams once or twice a year. ?Routine eye exams. Ask your health care provider how often you should have your eyes checked. ?Personal lifestyle choices, including: ?Daily care of your teeth and gums. ?Regular physical activity. ?Eating a healthy diet. ?Avoiding tobacco and drug use. ?Limiting alcohol use. ?Practicing safe sex. ?Taking low doses of aspirin every day. ?Taking vitamin and mineral supplements as recommended by your health care provider. ?What happens during an annual well check? ?The services and screenings done by your health care provider during your annual well check will depend on your age, overall health, lifestyle risk factors, and family history of disease. ?Counseling  ?Your health care provider may ask you questions about your: ?Alcohol use. ?Tobacco use. ?Drug use. ?Emotional well-being. ?Home and relationship  well-being. ?Sexual activity. ?Eating habits. ?History of falls. ?Memory and ability to understand (cognition). ?Work and work Statistician. ?Screening  ?You may have the following tests or measurements: ?Height, weight, and BMI. ?Blood pressure. ?Lipid and cholesterol levels. These may be checked every 5 years, or more frequently if you are over 75 years old. ?Skin check. ?Lung cancer screening. You may have this screening every year starting at age 60 if you have a 30-pack-year history of smoking and currently smoke or have quit within the past 15 years. ?Fecal occult blood test (FOBT) of the stool. You may have this test every year starting at age 75. ?Flexible sigmoidoscopy or colonoscopy. You may have a sigmoidoscopy every 5 years or a colonoscopy every 10 years starting at age 31. ?Prostate cancer screening. Recommendations will vary depending on your family history and other risks. ?Hepatitis C blood test. ?Hepatitis B blood test. ?Sexually transmitted disease (STD) testing. ?Diabetes screening. This is done by checking your blood sugar (glucose) after you have not eaten for a while (fasting). You may have this done every 1-3 years. ?Abdominal aortic aneurysm (AAA) screening. You may need this if you are a current or former smoker. ?Osteoporosis. You may be screened starting at age 78 if you are at high risk. ?Talk with your health care provider about your test results, treatment options, and if necessary, the need for more tests. ?Vaccines  ?Your health care provider may recommend certain vaccines, such as: ?Influenza vaccine. This is recommended every year. ?Tetanus, diphtheria, and acellular pertussis (Tdap, Td) vaccine. You may need a Td booster every 10 years. ?Zoster vaccine. You may need this after age 70. ?Pneumococcal 13-valent conjugate (PCV13) vaccine. One dose is recommended after age 82. ?Pneumococcal polysaccharide (PPSV23) vaccine.  One dose is recommended after age 55. ?Talk to your health care  provider about which screenings and vaccines you need and how often you need them. ?This information is not intended to replace advice given to you by your health care provider. Make sure you discuss any questions you have with your health care provider. ?Document Released: 06/08/2015 Document Revised: 01/30/2016 Document Reviewed: 03/13/2015 ?Elsevier Interactive Patient Education ? 2017 [place]. ? ?Fall Prevention in the Home ?Falls can cause injuries. They can happen to people of all ages. There are many things you can do to make your home safe and to help prevent falls. ?What can I do on the outside of my home? ?Regularly fix the edges of walkways and driveways and fix any cracks. ?Remove anything that might make you trip as you walk through a door, such as a raised step or threshold. ?Trim any bushes or trees on the path to your home. ?Use bright outdoor lighting. ?Clear any walking paths of anything that might make someone trip, such as rocks or tools. ?Regularly check to see if handrails are loose or broken. Make sure that both sides of any steps have handrails. ?Any raised decks and porches should have guardrails on the edges. ?Have any leaves, snow, or ice cleared regularly. ?Use sand or salt on walking paths during winter. ?Clean up any spills in your garage right away. This includes oil or grease spills. ?What can I do in the bathroom? ?Use night lights. ?Install grab bars by the toilet and in the tub and shower. Do not use towel bars as grab bars. ?Use non-skid mats or decals in the tub or shower. ?If you need to sit down in the shower, use a plastic, non-slip stool. ?Keep the floor dry. Clean up any water that spills on the floor as soon as it happens. ?Remove soap buildup in the tub or shower regularly. ?Attach bath mats securely with double-sided non-slip rug tape. ?Do not have throw rugs and other things on the floor that can make you trip. ?What can I do in the bedroom? ?Use night lights. ?Make  sure that you have a light by your bed that is easy to reach. ?Do not use any sheets or blankets that are too big for your bed. They should not hang down onto the floor. ?Have a firm chair that has side arms. You can use this for support while you get dressed. ?Do not have throw rugs and other things on the floor that can make you trip. ?What can I do in the kitchen? ?Clean up any spills right away. ?Avoid walking on wet floors. ?Keep items that you use a lot in easy-to-reach places. ?If you need to reach something above you, use a strong step stool that has a grab bar. ?Keep electrical cords out of the way. ?Do not use floor polish or wax that makes floors slippery. If you must use wax, use non-skid floor wax. ?Do not have throw rugs and other things on the floor that can make you trip. ?What can I do with my stairs? ?Do not leave any items on the stairs. ?Make sure that there are handrails on both sides of the stairs and use them. Fix handrails that are broken or loose. Make sure that handrails are as long as the stairways. ?Check any carpeting to make sure that it is firmly attached to the stairs. Fix any carpet that is loose or worn. ?Avoid having throw rugs at the top or bottom of  the stairs. If you do have throw rugs, attach them to the floor with carpet tape. ?Make sure that you have a light switch at the top of the stairs and the bottom of the stairs. If you do not have them, ask someone to add them for you. ?What else can I do to help prevent falls? ?Wear shoes that: ?Do not have high heels. ?Have rubber bottoms. ?Are comfortable and fit you well. ?Are closed at the toe. Do not wear sandals. ?If you use a stepladder: ?Make sure that it is fully opened. Do not climb a closed stepladder. ?Make sure that both sides of the stepladder are locked into place. ?Ask someone to hold it for you, if possible. ?Clearly mark and make sure that you can see: ?Any grab bars or handrails. ?First and last steps. ?Where the  edge of each step is. ?Use tools that help you move around (mobility aids) if they are needed. These include: ?Canes. ?Walkers. ?Scooters. ?Crutches. ?Turn on the lights when you go into a dark area.

## 2021-09-16 NOTE — Progress Notes (Signed)
Subjective:   Jack Weaver is a 66 y.o. male who presents for Medicare Annual/Subsequent preventive examination.  Review of Systems           Objective:    Today's Vitals   09/16/21 1341  BP: 138/90  Pulse: 64  SpO2: 98%  Weight: 233 lb 6.4 oz (105.9 kg)  Height: '5\' 11"'$  (1.803 m)   Body mass index is 32.55 kg/m.     07/23/2021    2:30 PM 07/01/2021    1:08 AM 08/24/2020    7:51 AM 08/24/2020    7:42 AM 08/08/2020   10:32 AM 06/05/2020    6:12 PM 05/13/2020    9:23 AM  Advanced Directives  Does Patient Have a Medical Advance Directive? No No Yes  Yes Yes Yes  Type of Personnel officer of [place];Living will Living will [place];Living will [place];Living will  Does patient want to make changes to medical advance directive?   No - Patient declined      Copy of [place] in Chart?   No - copy requested      Would patient like information on creating a medical advance directive? No - Patient declined          Current Medications (verified) Outpatient Encounter Medications as of 09/16/2021  Medication Sig   fluticasone (FLONASE) 50 MCG/ACT nasal spray fluticasone propionate 50 mcg/actuation nasal spray,suspension  SPRAY 2 SPRAYS INTO EACH NOSTRIL EVERY DAY   amLODipine (NORVASC) 10 MG tablet Take 1 tablet (10 mg total) by mouth daily.   carvedilol (COREG) 25 MG tablet Take 1 tablet (25 mg total) by mouth 2 (two) times daily.   fluticasone (FLONASE) 50 MCG/ACT nasal spray Place 2 sprays into both nostrils daily.   gabapentin (NEURONTIN) 300 MG capsule gabapentin 300 mg capsule   ibuprofen (ADVIL) 600 MG tablet Take 1 tablet (600 mg total) by mouth every 6 (six) hours as needed for moderate pain.   lisinopril (ZESTRIL) 40 MG tablet TAKE 1 TABLET EVERY DAY   meclizine (ANTIVERT) 12.5 MG tablet Take 12.5 mg by mouth 3 (three) times daily as needed.   meloxicam (MOBIC) 15  MG tablet Take 15 mg by mouth daily.   Multiple Vitamin (MULTIVITAMIN WITH MINERALS) TABS tablet Take 1 tablet by mouth daily.   omeprazole (PRILOSEC) 40 MG capsule TAKE 1 CAPSULE (40 MG TOTAL) BY MOUTH DAILY AS NEEDED (ACID REFLUX/ INDIGESTION).   oxyCODONE-acetaminophen (PERCOCET) 5-325 MG tablet Take 1-2 tablets by mouth every 6 (six) hours as needed for severe pain or moderate pain (no more than 6 tabs daily).   No facility-administered encounter medications on file as of 09/16/2021.    Allergies (verified) Patient has no known allergies.   History: Past Medical History:  Diagnosis Date   Arthritis    GERD (gastroesophageal reflux disease)    Hypertension    Prostate cancer ([place])    Sleep apnea    no CPAP   Past Surgical History:  Procedure Laterality Date   APPENDECTOMY     At age 45   CATARACT EXTRACTION W/PHACO Left 08/08/2020   Procedure: CATARACT EXTRACTION PHACO AND INTRAOCULAR LENS PLACEMENT (IOC) LEFT 5.62 01:00.4 903%;  Surgeon: Leandrew Koyanagi, MD;  Location: [facility];  Service: Ophthalmology;  Laterality: Left;   HEMORROIDECTOMY  1930   PELVIC LYMPH NODE DISSECTION Bilateral 10/11/2018   Procedure: PELVIC LYMPH NODE DISSECTION;  Surgeon: Hollice Espy,  MD;  Location: [facility];  Service: Urology;  Laterality: Bilateral;   QUADRICEPS TENDON REPAIR Left 09/09/2017   Procedure: REPAIR QUADRICEP TENDON;  Surgeon: Earnestine Leys, MD;  Location: [facility];  Service: Orthopedics;  Laterality: Left;   ROBOT ASSISTED LAPAROSCOPIC RADICAL PROSTATECTOMY N/A 10/11/2018   Procedure: ROBOTIC ASSISTED LAPAROSCOPIC RADICAL PROSTATECTOMY;  Surgeon: Hollice Espy, MD;  Location: [facility];  Service: Urology;  Laterality: N/A;   Family History  Problem Relation Age of Onset   Heart attack Mother    Stroke Father    Asthma Father    Cancer Brother    Diabetes Brother    Prostate cancer Neg Hx    Kidney cancer Neg Hx    Bladder Cancer Neg Hx    Social History    Socioeconomic History   Marital status: Married    Spouse name: Not on file   Number of children: Not on file   Years of education: Not on file   Highest education level: Not on file  Occupational History   Not on file  Tobacco Use   Smoking status: Some Days    Types: Cigars   Smokeless tobacco: Never  Vaping Use   Vaping Use: Never used  Substance and Sexual Activity   Alcohol use: Yes    Alcohol/week: 2.0 standard drinks    Types: 2 Cans of beer per week    Comment: occasionally   Drug use: No   Sexual activity: Not on file  Other Topics Concern   Not on file  Social History Narrative   Not on file   Social Determinants of Health   Financial Resource Strain: Not on file  Food Insecurity: Not on file  Transportation Needs: Not on file  Physical Activity: Not on file  Stress: Not on file  Social Connections: Not on file    Tobacco Counseling Ready to quit: Not Answered Counseling given: Not Answered   Clinical Intake:  Pre-visit preparation completed: Yes  Pain : No/denies pain     Nutritional Risks: None Diabetes: No  How often do you need to have someone help you when you read instructions, pamphlets, or other written materials from your doctor or pharmacy?: 1 - Never  Diabetic?no  Interpreter Needed?: No  Information entered by :: Kirke Shaggy, LPN   Activities of Daily Living    06/27/2021    3:39 PM 12/31/2020    9:09 AM  In your present state of health, do you have any difficulty performing the following activities:  Hearing? 0 0  Vision? 0 0  Difficulty concentrating or making decisions? 0 0  Walking or climbing stairs? 0 0  Dressing or bathing? 0 0  Doing errands, shopping? 0 0    Patient Care Team: [name], MD as PCP - General (Family Medicine) End, Harrell Gave, MD as PCP - Cardiology (Cardiology)  Indicate any recent Medical Services you may have received from other than [facility] providers in the past year (date may  be approximate).     Assessment:   This is a routine wellness examination for [place].  Hearing/Vision screen No results found.  Dietary issues and exercise activities discussed:     Goals Addressed   None    Depression Screen    06/27/2021    3:39 PM 12/31/2020    9:08 AM 03/25/2018    1:28 PM 01/18/2018   10:24 AM 06/30/2016    9:24 AM  PHQ 2/9 Scores  PHQ - 2 Score 0 0  0 0 0  PHQ- 9 Score 0 0       Fall Risk    06/27/2021    3:39 PM 12/31/2020    9:08 AM 03/25/2018    1:28 PM  [place] in the past year? 0 1 Yes  Number falls in past yr: 0 0 2 or more  Injury with Fall? 1 1 No  Risk for fall due to :  No Fall Risks     FALL RISK PREVENTION PERTAINING TO THE HOME:  Any stairs in or around the home? No  If so, are there any without handrails? No  Home free of loose throw rugs in walkways, pet beds, electrical cords, etc? Yes  Adequate lighting in your home to reduce risk of falls? Yes   ASSISTIVE DEVICES UTILIZED TO PREVENT FALLS:  Life alert? No  Use of a cane, walker or w/c? Yes  Grab bars in the bathroom? Yes  Shower chair or bench in shower? No  Elevated toilet seat or a handicapped toilet? No   TIMED UP AND GO:  Was the test performed? Yes .  Length of time to ambulate 10 feet: 4 sec.   Gait steady and fast without use of assistive device  Cognitive Function:        Immunizations Immunization History  Administered Date(s) Administered   Influenza, High Dose Seasonal PF 02/10/2019   Influenza-Unspecified 02/24/2015, 03/11/2016, 02/27/2018   PFIZER Comirnaty(Gray Top)Covid-19 Tri-Sucrose Vaccine 09/27/2020   PFIZER(Purple Top)SARS-COV-2 Vaccination 07/07/2019, 08/03/2019, 03/11/2020   Pneumococcal Polysaccharide-23 11/24/2018   Td 05/26/1989, 05/01/1998, 12/31/2020   Tdap 12/24/2009    TDAP status: Up to date  Flu Vaccine status: Declined, Education has been provided regarding the importance of this vaccine but patient still declined.  Advised may receive this vaccine at local pharmacy or Health Dept. Aware to provide a copy of the vaccination record if obtained from local pharmacy or Health Dept. Verbalized acceptance and understanding.  Pneumococcal vaccine status: Declined,  Education has been provided regarding the importance of this vaccine but patient still declined. Advised may receive this vaccine at local pharmacy or Health Dept. Aware to provide a copy of the vaccination record if obtained from local pharmacy or Health Dept. Verbalized acceptance and understanding.   Covid-19 vaccine status: Completed vaccines  Qualifies for Shingles Vaccine? Yes   Zostavax completed No   Shingrix Completed?: No.    Education has been provided regarding the importance of this vaccine. Patient has been advised to call insurance company to determine out of pocket expense if they have not yet received this vaccine. Advised may also receive vaccine at local pharmacy or Health Dept. Verbalized acceptance and understanding.  Screening Tests Health Maintenance  Topic Date Due   Zoster Vaccines- Shingrix (1 of 2) Never done   COLONOSCOPY (Pts 45-80yr Insurance coverage will need to be confirmed)  06/27/2014   Pneumonia Vaccine 68 Years old (2 - PCV) 11/24/2019   COVID-19 Vaccine (5 - Booster for Pfizer series) 11/22/2020   INFLUENZA VACCINE  12/24/2021   TETANUS/TDAP  01/01/2031   Hepatitis C Screening  Completed   HPV VACCINES  Aged Out    Health Maintenance  Health Maintenance Due  Topic Date Due   Zoster Vaccines- Shingrix (1 of 2) Never done   COLONOSCOPY (Pts 45-45yrInsurance coverage will need to be confirmed)  06/27/2014   Pneumonia Vaccine 6545Years old (2 - PCV) 11/24/2019   COVID-19 Vaccine (5 - Booster for PfCoca-Colaeries)  11/22/2020    Wants to wait until September CPE w/ Dr.Gilbert for colonscopy  Lung Cancer Screening: (Low Dose CT Chest recommended if Age 21-80 years, 30 pack-year currently smoking OR have quit  w/in 15years.) does not qualify. - Smokes one cigar/day  Additional Screening:  Hepatitis C Screening: does qualify; Completed 07/06/14  Vision Screening: Recommended annual ophthalmology exams for early detection of glaucoma and other disorders of the eye. Is the patient up to date with their annual eye exam?  Yes  Who is the provider or what is the name of the office in which the patient attends annual eye exams? [facility] If pt is not established with a provider, would they like to be referred to a provider to establish care? No .   Dental Screening: Recommended annual dental exams for proper oral hygiene  Community Resource Referral / Chronic Care Management: CRR required this visit?  No   CCM required this visit?  No      Plan:     I have personally reviewed and noted the following in the patient's chart:   Medical and social history Use of alcohol, tobacco or illicit drugs  Current medications and supplements including opioid prescriptions. Patient is not currently taking opioid prescriptions. Functional ability and status Nutritional status Physical activity Advanced directives List of other physicians Hospitalizations, surgeries, and ER visits in previous 12 months Vitals Screenings to include cognitive, depression, and falls Referrals and appointments  In addition, I have reviewed and discussed with patient certain preventive protocols, quality metrics, and best practice recommendations. A written personalized care plan for preventive services as well as general preventive health recommendations were provided to patient.     Dionisio David, LPN   7/34/1939   Nurse Notes: none

## 2021-09-23 ENCOUNTER — Ambulatory Visit: Payer: Medicare HMO | Admitting: Medical

## 2021-09-23 ENCOUNTER — Other Ambulatory Visit: Payer: Medicare HMO

## 2021-09-23 DIAGNOSIS — C61 Malignant neoplasm of prostate: Secondary | ICD-10-CM

## 2021-09-24 LAB — PSA: Prostate Specific Ag, Serum: <0.1 ng/mL (ref 0.0–4.0)

## 2021-09-24 NOTE — Progress Notes (Signed)
? ?09/25/2021 ?12:05 PM  ? ?ELERY CADENHEAD ?1956/02/17 ?[id number] ? ?Referring provider:  ?[name], MD ?[address] ?[address] ?[place],  [place] [address] ?Chief Complaint  ?Patient presents with  ? Prostate Cancer  ? ? ? ?HPI: ?Jack Weaver is a 66 y.o.male with a personal history of prostate cancer and stress incontinence who presents today for a 6 month follow-up with PSA and Eligard.  ? ?He underwent radical prostatectomy non-nerve sparing with bilateral pelvic lymph node dissection on 10/01/2018.  Surgical pathology was consistent with Gleason 3+5 disease, pT3a N1.  He was noted to have extraprostatic extension with positive posterior margins both left and right.  He is also noted to have 2 of 3 lymph nodes on the left with focal metastatic disease.  0 of 3 lymph nodes on the right.  No bladder neck involvement.  No seminal vesicle involvement. ?  ?He underwent staging axiom PET scan on 11/08/2018 that was negative for metastatic disease and was started on Lupron. He declined whole pelvic radiation.  ? ?Testosterone indicated he did not achieve castrate levels in 2021 and a second dose of ADT was administered.  His PSA had risen to 0.7 In 2021.  ? ?He did not tolerate apalutamide as additional adjuvant treatment to his ADT.  ? ?His most recent PSA remains undetectable on 09/23/2021.  ? ?He was seen in the ED on 07/23/2021. He had left testicle pain. Urinalysis was unremarkable and urine culture had insignificant growth .  Scrotal ultrasound on 07/23/2021 visualized possible epididymitis, small bilateral hydrocele, bilateral varicocele that was more prominent on the right side.  ? ?He is doing well today with no new urinary symptoms. Mild leakage at times. ? ?PMH: ?Past Medical History:  ?Diagnosis Date  ? Arthritis   ? GERD (gastroesophageal reflux disease)   ? Hypertension   ? Prostate cancer ([place])   ? Sleep apnea   ? no CPAP  ? ? ?Surgical History: ?Past Surgical History:  ?Procedure Laterality  Date  ? APPENDECTOMY    ? At age 63  ? CATARACT EXTRACTION W/PHACO Left 08/08/2020  ? Procedure: CATARACT EXTRACTION PHACO AND INTRAOCULAR LENS PLACEMENT (IOC) LEFT 5.62 01:00.4 903%;  Surgeon: Leandrew Koyanagi, MD;  Location: [facility];  Service: Ophthalmology;  Laterality: Left;  ? HEMORROIDECTOMY  1930  ? PELVIC LYMPH NODE DISSECTION Bilateral 10/11/2018  ? Procedure: PELVIC LYMPH NODE DISSECTION;  Surgeon: Hollice Espy, MD;  Location: [facility];  Service: Urology;  Laterality: Bilateral;  ? QUADRICEPS TENDON REPAIR Left 09/09/2017  ? Procedure: REPAIR QUADRICEP TENDON;  Surgeon: Earnestine Leys, MD;  Location: [facility];  Service: Orthopedics;  Laterality: Left;  ? ROBOT ASSISTED LAPAROSCOPIC RADICAL PROSTATECTOMY N/A 10/11/2018  ? Procedure: ROBOTIC ASSISTED LAPAROSCOPIC RADICAL PROSTATECTOMY;  Surgeon: Hollice Espy, MD;  Location: [facility];  Service: Urology;  Laterality: N/A;  ? ? ?Home Medications:  ?Allergies as of 09/25/2021   ?No Known Allergies ?  ? ?  ?Medication List  ?  ? ?  ? Accurate as of Sep 25, 2021 11:59 PM. If you have any questions, ask your nurse or doctor.  ?  ?  ? ?  ? ?STOP taking these medications   ? ?carvedilol 25 MG tablet ?Commonly known as: COREG ?  ?meclizine 12.5 MG tablet ?Commonly known as: ANTIVERT ?  ?oxyCODONE-acetaminophen 5-325 MG tablet ?Commonly known as: Percocet ?  ? ?  ? ?TAKE these medications   ? ?amLODipine 10 MG tablet ?Commonly known as: NORVASC ?  Take 1 tablet (10 mg total) by mouth daily. ?  ?fluticasone 50 MCG/ACT nasal spray ?Commonly known as: FLONASE ?fluticasone propionate 50 mcg/actuation nasal spray,suspension ? SPRAY 2 SPRAYS INTO EACH NOSTRIL EVERY DAY ?  ?fluticasone 50 MCG/ACT nasal spray ?Commonly known as: FLONASE ?Place 2 sprays into both nostrils daily. ?  ?gabapentin 300 MG capsule ?Commonly known as: NEURONTIN ?gabapentin 300 mg capsule ?  ?ibuprofen 600 MG tablet ?Commonly known as: ADVIL ?Take 1 tablet (600 mg total) by mouth every 6  (six) hours as needed for moderate pain. ?  ?lisinopril 40 MG tablet ?Commonly known as: ZESTRIL ?TAKE 1 TABLET EVERY DAY ?  ?meloxicam 15 MG tablet ?Commonly known as: MOBIC ?Take 15 mg by mouth daily. ?  ?multivitamin with minerals Tabs tablet ?Take 1 tablet by mouth daily. ?  ?omeprazole 40 MG capsule ?Commonly known as: PRILOSEC ?TAKE 1 CAPSULE (40 MG TOTAL) BY MOUTH DAILY AS NEEDED (ACID REFLUX/ INDIGESTION). ?  ? ?  ? ? ?Allergies:  ?No Known Allergies ? ?Family History: ?Family History  ?Problem Relation Age of Onset  ? Heart attack Mother   ? Stroke Father   ? Asthma Father   ? Cancer Brother   ? Diabetes Brother   ? Prostate cancer Neg Hx   ? Kidney cancer Neg Hx   ? Bladder Cancer Neg Hx   ? ? ?Social History:  reports that he has been smoking cigars. He has never used smokeless tobacco. He reports current alcohol use of about 2.0 standard drinks per week. He reports that he does not use drugs. ? ? ?Physical Exam: ?BP (!) 148/88   Pulse 62   Ht '5\' 11"'$  (1.803 m)   Wt 222 lb (100.7 kg)   BMI 30.96 kg/m?   ?Constitutional:  Alert and oriented, No acute distress. ?HEENT: [place] AT, moist mucus membranes.  Trachea midline, no masses. ?Cardiovascular: No clubbing, cyanosis, or edema. ?Respiratory: Normal respiratory effort, no increased work of breathing. ?Skin: No rashes, bruises or suspicious lesions. ?Neurologic: Grossly intact, no focal deficits, moving all 4 extremities. ?Psychiatric: Normal mood and affect. ? ?Laboratory Data: ?Lab Results  ?Component Value Date  ? CREATININE 0.90 07/23/2021  ? ?Lab Results  ?Component Value Date  ? HGBA1C 5.7 (H) 09/04/2017  ? ? ?Assessment & Plan:   ? ?Prostate cancer  ?- Complex, currently castrate sensitive metastatic to the pelvic lymph nodes  ?- Managed on ADT alone for biochemical occurrence, unable to tolerate  apalutamide and not interested in addition of other oral agents at this time ?- Continue ADT indefinitely consider addition of oral med in future if PSA  begins to rise  ?- PSA undetectable  ?- leuprolide (6 Month) (ELIGARD) injection 45 mg administered today ?- PSA; Future ? ?2. Stress urinary incontinence ?-Minimal ? ?F/u 6 month PSA/ ADT ? ?Conley Rolls as a Education administrator for Hollice Espy, MD.,have documented all relevant documentation on the behalf of Hollice Espy, MD,as directed by  Hollice Espy, MD while in the presence of Hollice Espy, MD. ? ?I have reviewed the above documentation for accuracy and completeness, and I agree with the above.  ? ?Hollice Espy, MD ? ? ?[facility] ?[address] ?[place], [place] [address] ?[phone] ?

## 2021-09-25 ENCOUNTER — Ambulatory Visit: Payer: Medicare HMO | Admitting: Urology

## 2021-09-25 VITALS — BP 148/88 | HR 62 | Ht 71.0 in | Wt 222.0 lb

## 2021-09-25 DIAGNOSIS — Z8589 Personal history of malignant neoplasm of other organs and systems: Secondary | ICD-10-CM

## 2021-09-25 DIAGNOSIS — C61 Malignant neoplasm of prostate: Secondary | ICD-10-CM

## 2021-09-25 DIAGNOSIS — N393 Stress incontinence (female) (male): Secondary | ICD-10-CM

## 2021-09-25 DIAGNOSIS — Z8546 Personal history of malignant neoplasm of prostate: Secondary | ICD-10-CM | POA: Diagnosis not present

## 2021-09-25 MED ORDER — LEUPROLIDE ACETATE (6 MONTH) 45 MG ~~LOC~~ KIT
45.0000 mg | PACK | Freq: Once | SUBCUTANEOUS | Status: AC
  Administered 2021-09-25: 45 mg via SUBCUTANEOUS

## 2021-10-04 ENCOUNTER — Ambulatory Visit: Payer: Medicare HMO | Admitting: Medical

## 2021-10-04 ENCOUNTER — Encounter: Payer: Self-pay | Admitting: Medical

## 2021-10-04 VITALS — BP 120/70 | HR 76 | Ht 71.0 in | Wt 234.0 lb

## 2021-10-04 DIAGNOSIS — R600 Localized edema: Secondary | ICD-10-CM

## 2021-10-04 DIAGNOSIS — I10 Essential (primary) hypertension: Secondary | ICD-10-CM

## 2021-10-04 NOTE — Patient Instructions (Signed)
Medication Instructions:  Your physician recommends that you continue on your current medications as directed. Please refer to the Current Medication list given to you today.  *If you need a refill on your cardiac medications before your next appointment, please call your pharmacy*   Lab Work: None ordered If you have labs (blood work) drawn today and your tests are completely normal, you will receive your results only by: MyChart Message (if you have MyChart) OR A paper copy in the mail If you have any lab test that is abnormal or we need to change your treatment, we will call you to review the results.   Testing/Procedures: None ordered   Follow-Up: At [facility], you and your health needs are our priority.  As part of our continuing mission to provide you with exceptional heart care, we have created designated Provider Care Teams.  These Care Teams include your primary Cardiologist (physician) and Advanced Practice Providers (APPs -  Physician Assistants and Nurse Practitioners) who all work together to provide you with the care you need, when you need it.  We recommend signing up for the patient portal called "MyChart".  Sign up information is provided on this After Visit Summary.  MyChart is used to connect with patients for Virtual Visits (Telemedicine).  Patients are able to view lab/test results, encounter notes, upcoming appointments, etc.  Non-urgent messages can be sent to your provider as well.   To learn more about what you can do with MyChart, go to [email or web address].    Your next appointment:   6 month(s)  The format for your next appointment:   In Person  Provider:   You may see Christopher End, MD or one of the following Advanced Practice Providers on your designated Care Team:   Christopher Berge, NP Ryan Dunn, PA-C Cadence Furth, PA-C   Other Instructions N/A  Important Information About Sugar       

## 2021-10-04 NOTE — Progress Notes (Signed)
?Cardiology Office Note:   ? ?Date:  10/04/2021  ? ?ID:  Jack Weaver, DOB May 20, 1956, MRN [id number] ? ?PCP:  [name], MD  ?[facility] Cardiologist:  Nelva Bush, MD  ?[facility] Electrophysiologist:  None  ? ?Referring MD: [name],*  ? ?Chief Complaint: 1 month follow-up ? ?History of Present Illness:   ? ?Jack Weaver is a 66 y.o. male with a hx of HTN, prostate cancer s/p prostatectomy, arthritis who presents for HTN follow-up.  ?  ?Seen in 2021 for palpitations and PVCs. Heart monitor showed NSR, average Hr 57 with brief episodes of PSVT, NSVT and rare PVCs/bigeminy. Echo showed LVEF 55-60%, mod LVH, G2DD. Metoprolol improved palpitations. BP was high but patient didn't want to start another medication.  ? ?Last seen 08/15/21 and BP was high, started on amlodipine. Metoprolol was switched to coreg.  ? ?Today, the patient reports he stopped taking Coreg after 4-5 days from lightheadedness . He is now feelig better, back to normal. He denies chest pain and SOB. He is exercising regularly. No other changes.  ? ?Past Medical History:  ?Diagnosis Date  ? Arthritis   ? GERD (gastroesophageal reflux disease)   ? Hypertension   ? Prostate cancer ([place])   ? Sleep apnea   ? no CPAP  ? ? ?Past Surgical History:  ?Procedure Laterality Date  ? APPENDECTOMY    ? At age 57  ? CATARACT EXTRACTION W/PHACO Left 08/08/2020  ? Procedure: CATARACT EXTRACTION PHACO AND INTRAOCULAR LENS PLACEMENT (IOC) LEFT 5.62 01:00.4 903%;  Surgeon: Leandrew Koyanagi, MD;  Location: [facility];  Service: Ophthalmology;  Laterality: Left;  ? HEMORROIDECTOMY  1930  ? PELVIC LYMPH NODE DISSECTION Bilateral 10/11/2018  ? Procedure: PELVIC LYMPH NODE DISSECTION;  Surgeon: Hollice Espy, MD;  Location: [facility];  Service: Urology;  Laterality: Bilateral;  ? QUADRICEPS TENDON REPAIR Left 09/09/2017  ? Procedure: REPAIR QUADRICEP TENDON;  Surgeon: Earnestine Leys, MD;  Location: [facility];  Service:  Orthopedics;  Laterality: Left;  ? ROBOT ASSISTED LAPAROSCOPIC RADICAL PROSTATECTOMY N/A 10/11/2018  ? Procedure: ROBOTIC ASSISTED LAPAROSCOPIC RADICAL PROSTATECTOMY;  Surgeon: Hollice Espy, MD;  Location: [facility];  Service: Urology;  Laterality: N/A;  ? ? ?Current Medications: ?Current Meds  ?Medication Sig  ? fluticasone (FLONASE) 50 MCG/ACT nasal spray Place 2 sprays into both nostrils daily.  ? fluticasone (FLONASE) 50 MCG/ACT nasal spray fluticasone propionate 50 mcg/actuation nasal spray,suspension ? SPRAY 2 SPRAYS INTO EACH NOSTRIL EVERY DAY  ? gabapentin (NEURONTIN) 300 MG capsule gabapentin 300 mg capsule  ? ibuprofen (ADVIL) 600 MG tablet Take 1 tablet (600 mg total) by mouth every 6 (six) hours as needed for moderate pain.  ? lisinopril (ZESTRIL) 40 MG tablet TAKE 1 TABLET EVERY DAY  ? meloxicam (MOBIC) 15 MG tablet Take 15 mg by mouth daily.  ? metoprolol succinate (TOPROL-XL) 25 MG 24 hr tablet Take 25 mg by mouth daily.  ? Multiple Vitamin (MULTIVITAMIN WITH MINERALS) TABS tablet Take 1 tablet by mouth daily.  ? omeprazole (PRILOSEC) 40 MG capsule TAKE 1 CAPSULE (40 MG TOTAL) BY MOUTH DAILY AS NEEDED (ACID REFLUX/ INDIGESTION).  ?  ? ?Allergies:   Patient has no known allergies.  ? ?Social History  ? ?Socioeconomic History  ? Marital status: Married  ?  Spouse name: Not on file  ? Number of children: Not on file  ? Years of education: Not on file  ? Highest education level: Not on file  ?Occupational  History  ? Not on file  ?Tobacco Use  ? Smoking status: Some Days  ?  Types: Cigars  ? Smokeless tobacco: Never  ?Vaping Use  ? Vaping Use: Never used  ?Substance and Sexual Activity  ? Alcohol use: Yes  ?  Alcohol/week: 2.0 standard drinks  ?  Types: 2 Cans of beer per week  ?  Comment: occasionally  ? Drug use: No  ? Sexual activity: Not on file  ?Other Topics Concern  ? Not on file  ?Social History Narrative  ? Not on file  ? ?Social Determinants of Health  ? ?Financial Resource Strain: Low Risk   ?  Difficulty of Paying Living Expenses: Not hard at all  ?Food Insecurity: No Food Insecurity  ? Worried About Charity fundraiser in the Last Year: Never true  ? Ran Out of Food in the Last Year: Never true  ?Transportation Needs: No Transportation Needs  ? Lack of Transportation (Medical): No  ? Lack of Transportation (Non-Medical): No  ?Physical Activity: Sufficiently Active  ? Days of Exercise per Week: 6 days  ? Minutes of Exercise per Session: 60 min  ?Stress: No Stress Concern Present  ? Feeling of Stress : Not at all  ?Social Connections: Moderately Integrated  ? Frequency of Communication with Friends and Family: More than three times a week  ? Frequency of Social Gatherings with Friends and Family: Once a week  ? Attends Religious Services: 1 to 4 times per year  ? Active Member of Clubs or Organizations: No  ? Attends Archivist Meetings: Never  ? Marital Status: Married  ?  ? ?Family History: ?The patient's family history includes Asthma in his father; Cancer in his brother; Diabetes in his brother; Heart attack in his mother; Stroke in his father. There is no history of Prostate cancer, Kidney cancer, or Bladder Cancer. ? ?ROS:   ?Please see the history of present illness.    ? All other systems reviewed and are negative. ? ?EKGs/Labs/Other Studies Reviewed:   ? ?The following studies were reviewed today: ? ?Echo 04/17/20 ? ? 1. Left ventricular ejection fraction, by estimation, is 55 to 60%. The  ?left ventricle has normal function. The left ventricle has no regional  ?wall motion abnormalities. There is moderate left ventricular hypertrophy.  ?Left ventricular diastolic  ?parameters are consistent with Grade II diastolic dysfunction  ?(pseudonormalization). The average left ventricular global longitudinal  ?strain is -16.3 %. The global longitudinal strain is abnormal.  ? 2. Right ventricular systolic function is normal. The right ventricular  ?size is mildly enlarged. There is normal  pulmonary artery systolic  ?pressure.  ? 3. Left atrial size was moderately dilated.  ? 4. Right atrial size was mildly dilated.  ? 5. The mitral valve is normal in structure. Mild to moderate mitral valve  ?regurgitation. No evidence of mitral stenosis.  ? 6. Tricuspid valve regurgitation is mild to moderate.  ? 7. The aortic valve is tricuspid. There is mild calcification of the  ?aortic valve. There is moderate thickening of the aortic valve. Aortic  ?valve regurgitation is not visualized. Mild to moderate aortic valve  ?sclerosis/calcification is present, without  ?any evidence of aortic stenosis.  ? 8. The inferior vena cava is normal in size with <50% respiratory  ?variability, suggesting right atrial pressure of 8 mmHg.  ?  ?Heart monitor 03/2020 ?  ?The patient was monitored for 6 days, 17 hours. ?The predominant rhythm was sinus with an average  rate of 56 bpm (range 42-132 bpm in sinus). ?There were rare PACs and occasional PVCs (PVC burden 3.6%). ?10 atrial runs lasting up to 16 beats with a maximum rate of 179 bpm were observed. A single 6 beat run of nonsustained ventricular tachycardia also occurred. ?No sustained arrhythmia or prolonged pause was identified. ?There were 117 patient triggered events during the monitoring period, corresponding to sinus rhythm, PACs, PVCs (including ventricular bigeminy), PSVT, and NSVT. ?  ?Predominately sinus rhythm with occasional PVCs and rare PACs.  Brief episodes of PSVT and NSVT also noted.  Numerous patient triggered events recorded, many of which correspond to ventricular bigeminy. ? ?EKG:  EKG is not ordered today.  ? ?Recent Labs: ?07/23/2021: ALT 19; BUN 15; Creatinine, Ser 0.90; Hemoglobin 12.4; Platelets 201; Potassium 3.9; Sodium 138  ?Recent Lipid Panel ?   ?Component Value Date/Time  ? CHOL 167 07/02/2016 0813  ? CHOL 178 06/22/2011 0025  ? TRIG 90 07/02/2016 0813  ? TRIG 109 06/22/2011 0025  ? HDL 63 07/02/2016 0813  ? HDL 46 06/22/2011 0025  ? VLDL 22  06/22/2011 0025  ? [place] 86 07/02/2016 0813  ? [place] 110 (H) 06/22/2011 0025  ? ? ?Physical Exam:   ? ?VS:  BP 120/70 (BP Location: Left Arm, Patient Position: Sitting, Cuff Size: Normal)   Pulse 76   Ht '5\' 11"'$  (

## 2021-10-07 ENCOUNTER — Telehealth: Payer: Self-pay | Admitting: Family Medicine

## 2021-10-07 ENCOUNTER — Other Ambulatory Visit: Payer: Self-pay

## 2021-10-07 MED ORDER — FLUTICASONE PROPIONATE 50 MCG/ACT NA SUSP: 2.0000 | Freq: Every day | NASAL | 3 refills | Status: DC

## 2021-10-07 NOTE — Telephone Encounter (Signed)
[facility] faxed refill request for the following medications: ? ?fluticasone (FLONASE) 50 MCG/ACT nasal spray ? ? ?Please advise ? ?

## 2021-10-24 DIAGNOSIS — L720 Epidermal cyst: Secondary | ICD-10-CM | POA: Diagnosis not present

## 2021-10-24 DIAGNOSIS — R208 Other disturbances of skin sensation: Secondary | ICD-10-CM | POA: Diagnosis not present

## 2021-10-24 DIAGNOSIS — Z789 Other specified health status: Secondary | ICD-10-CM | POA: Diagnosis not present

## 2021-11-08 DIAGNOSIS — J301 Allergic rhinitis due to pollen: Secondary | ICD-10-CM | POA: Diagnosis not present

## 2021-11-08 DIAGNOSIS — H903 Sensorineural hearing loss, bilateral: Secondary | ICD-10-CM | POA: Diagnosis not present

## 2021-11-08 DIAGNOSIS — H9319 Tinnitus, unspecified ear: Secondary | ICD-10-CM | POA: Diagnosis not present

## 2021-12-02 ENCOUNTER — Other Ambulatory Visit: Payer: Self-pay | Admitting: Family Medicine

## 2021-12-03 DIAGNOSIS — A084 Viral intestinal infection, unspecified: Secondary | ICD-10-CM | POA: Diagnosis not present

## 2021-12-10 DIAGNOSIS — M5412 Radiculopathy, cervical region: Secondary | ICD-10-CM | POA: Diagnosis not present

## 2021-12-14 ENCOUNTER — Emergency Department
Admission: EM | Admit: 2021-12-14 | Discharge: 2021-12-14 | Disposition: A | Discharge status: Home / Self Care | Payer: Medicare HMO | Attending: Emergency Medicine | Admitting: Emergency Medicine

## 2021-12-14 ENCOUNTER — Other Ambulatory Visit: Payer: Self-pay

## 2021-12-14 DIAGNOSIS — S161XXA Strain of muscle, fascia and tendon at neck level, initial encounter: Secondary | ICD-10-CM | POA: Diagnosis not present

## 2021-12-14 DIAGNOSIS — M25511 Pain in right shoulder: Secondary | ICD-10-CM | POA: Insufficient documentation

## 2021-12-14 DIAGNOSIS — X509XXA Other and unspecified overexertion or strenuous movements or postures, initial encounter: Secondary | ICD-10-CM | POA: Insufficient documentation

## 2021-12-14 DIAGNOSIS — M542 Cervicalgia: Secondary | ICD-10-CM | POA: Diagnosis present

## 2021-12-14 MED ORDER — NAPROXEN 500 MG PO TABS: 500.0000 mg | ORAL_TABLET | Freq: Two times a day (BID) | ORAL | 0 refills | Status: DC

## 2021-12-14 MED ORDER — LIDOCAINE 5 % EX PTCH: 1.0000 | MEDICATED_PATCH | Freq: Two times a day (BID) | CUTANEOUS | 0 refills | Status: DC

## 2021-12-14 MED ORDER — NAPROXEN 500 MG PO TABS
500.0000 mg | ORAL_TABLET | Freq: Once | ORAL | Status: AC
Start: 2021-12-14 — End: 2021-12-14
  Administered 2021-12-14: 500 mg via ORAL
  Filled 2021-12-14: qty 1

## 2021-12-14 MED ORDER — LIDOCAINE 5 % EX PTCH
1.0000 | MEDICATED_PATCH | Freq: Two times a day (BID) | CUTANEOUS | 0 refills | Status: DC
Start: 2021-12-14 — End: 2021-12-14

## 2021-12-14 MED ORDER — LIDOCAINE 5 % EX PTCH
1.0000 | MEDICATED_PATCH | CUTANEOUS | Status: DC
  Administered 2021-12-14: 1 via TRANSDERMAL
  Filled 2021-12-14: qty 1

## 2021-12-14 NOTE — ED Triage Notes (Signed)
Pt presents to ER c/o neck pain with radiation down right arm that started appx 1 week ago but has been getting worse.  Pt seen at [facility] on Tuesday and dx with cervical radiculopathy.  Pt states she has been taking muscle relaxer and prednisone without relief.  Pt denies trauma to area.  Pt A&O x4 at this time in NAD in triage.
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
## 2021-12-18 ENCOUNTER — Telehealth: Payer: Self-pay | Admitting: Family Medicine

## 2021-12-18 ENCOUNTER — Other Ambulatory Visit: Payer: Self-pay

## 2021-12-18 MED ORDER — AMLODIPINE BESYLATE 10 MG PO TABS: 10.0000 mg | ORAL_TABLET | Freq: Every day | ORAL | 1 refills | Status: DC

## 2021-12-18 NOTE — Telephone Encounter (Signed)
[facility] faxed refill request for the following medications:   amLODipine (NORVASC) 5 MG tablet     Please advise.  

## 2021-12-25 DIAGNOSIS — M5412 Radiculopathy, cervical region: Secondary | ICD-10-CM | POA: Diagnosis not present

## 2022-01-22 DIAGNOSIS — M542 Cervicalgia: Secondary | ICD-10-CM | POA: Diagnosis not present

## 2022-01-22 DIAGNOSIS — M25532 Pain in left wrist: Secondary | ICD-10-CM | POA: Diagnosis not present

## 2022-02-06 ENCOUNTER — Ambulatory Visit (INDEPENDENT_AMBULATORY_CARE_PROVIDER_SITE_OTHER): Payer: Medicare HMO | Admitting: Family Medicine

## 2022-02-06 ENCOUNTER — Encounter: Payer: Self-pay | Admitting: Family Medicine

## 2022-02-06 VITALS — BP 114/74 | HR 65 | Resp 16 | Ht 71.0 in | Wt 231.0 lb

## 2022-02-06 DIAGNOSIS — I10 Essential (primary) hypertension: Secondary | ICD-10-CM

## 2022-02-06 DIAGNOSIS — M5416 Radiculopathy, lumbar region: Secondary | ICD-10-CM

## 2022-02-06 DIAGNOSIS — G629 Polyneuropathy, unspecified: Secondary | ICD-10-CM | POA: Diagnosis not present

## 2022-02-06 DIAGNOSIS — Z0000 Encounter for general adult medical examination without abnormal findings: Secondary | ICD-10-CM

## 2022-02-06 DIAGNOSIS — M5412 Radiculopathy, cervical region: Secondary | ICD-10-CM

## 2022-02-06 DIAGNOSIS — Z23 Encounter for immunization: Secondary | ICD-10-CM | POA: Diagnosis not present

## 2022-02-06 DIAGNOSIS — Z8546 Personal history of malignant neoplasm of prostate: Secondary | ICD-10-CM | POA: Diagnosis not present

## 2022-02-06 DIAGNOSIS — K219 Gastro-esophageal reflux disease without esophagitis: Secondary | ICD-10-CM

## 2022-02-06 DIAGNOSIS — J301 Allergic rhinitis due to pollen: Secondary | ICD-10-CM | POA: Diagnosis not present

## 2022-02-06 DIAGNOSIS — I7143 Infrarenal abdominal aortic aneurysm, without rupture: Secondary | ICD-10-CM | POA: Diagnosis not present

## 2022-02-06 NOTE — Progress Notes (Unsigned)
Complete physical exam  I,April Miller,acting as a scribe for Wilhemena Durie, MD.,have documented all relevant documentation on the behalf of Wilhemena Durie, MD,as directed by  Wilhemena Durie, MD while in the presence of Wilhemena Durie, MD.   Patient: Jack Weaver   DOB: [birth year]   66 y.o. Male  MRN: [id number] Visit Date: 02/06/2022  Today's healthcare provider: Wilhemena Durie, MD   Chief Complaint  Patient presents with   Annual Exam   Subjective    Jack Weaver is a 66 y.o. male who presents today for a complete physical exam.  Patient walks 5 miles daily.  He feels well other than chronic back and neck pain. HPI  Patient had AWV with [facility] on 09/16/2021.  Past Medical History:  Diagnosis Date   Arthritis    GERD (gastroesophageal reflux disease)    Hypertension    [place] cancer ([place])    Sleep apnea    no CPAP   Past Surgical History:  Procedure Laterality Date   APPENDECTOMY     At age 26   CATARACT EXTRACTION W/PHACO Left 08/08/2020   Procedure: CATARACT EXTRACTION PHACO AND INTRAOCULAR LENS PLACEMENT (IOC) LEFT 5.62 01:00.4 903%;  Surgeon: Leandrew Koyanagi, MD;  Location: [place];  Service: Ophthalmology;  Laterality: Left;   HEMORROIDECTOMY  1930   PELVIC LYMPH NODE DISSECTION Bilateral 10/11/2018   Procedure: PELVIC LYMPH NODE DISSECTION;  Surgeon: Hollice Espy, MD;  Location: [facility];  Service: Urology;  Laterality: Bilateral;   QUADRICEPS TENDON REPAIR Left 09/09/2017   Procedure: REPAIR QUADRICEP TENDON;  Surgeon: Earnestine Leys, MD;  Location: [facility];  Service: Orthopedics;  Laterality: Left;   ROBOT ASSISTED LAPAROSCOPIC RADICAL PROSTATECTOMY N/A 10/11/2018   Procedure: ROBOTIC ASSISTED LAPAROSCOPIC RADICAL PROSTATECTOMY;  Surgeon: Hollice Espy, MD;  Location: [facility];  Service: Urology;  Laterality: N/A;   Social History   Socioeconomic History   Marital status: Married    Spouse name: Not on file    Number of children: Not on file   Years of education: Not on file   Highest education level: Not on file  Occupational History   Not on file  Tobacco Use   Smoking status: Some Days    Types: Cigars   Smokeless tobacco: Never  Vaping Use   Vaping Use: Never used  Substance and Sexual Activity   Alcohol use: Yes    Alcohol/week: 2.0 standard drinks of alcohol    Types: 2 Cans of beer per week    Comment: occasionally   Drug use: No   Sexual activity: Not on file  Other Topics Concern   Not on file  Social History Narrative   Not on file   Social Determinants of Health   Financial Resource Strain: Low Risk  (09/16/2021)   Overall Financial Resource Strain (CARDIA)    Difficulty of Paying Living Expenses: Not hard at all  Food Insecurity: No Food Insecurity (09/16/2021)   Hunger Vital Sign    Worried About Running Out of Food in the Last Year: Never true    Ran Out of Food in the Last Year: Never true  Transportation Needs: No Transportation Needs (09/16/2021)   PRAPARE - Hydrologist (Medical): No    Lack of Transportation (Non-Medical): No  Physical Activity: Sufficiently Active (09/16/2021)   Exercise Vital Sign    Days of Exercise per Week: 6 days    Minutes of Exercise per Session:  60 min  Stress: No Stress Concern Present (09/16/2021)   [place]    Feeling of Stress : Not at all  Social Connections: Moderately Integrated (09/16/2021)   Social Connection and Isolation Panel [NHANES]    Frequency of Communication with Friends and Family: More than three times a week    Frequency of Social Gatherings with Friends and Family: Once a week    Attends Religious Services: 1 to 4 times per year    Active Member of [employer] or Organizations: No    Attends Archivist Meetings: Never    Marital Status: Married  Human resources officer Violence: Not At Risk (09/16/2021)   Humiliation,  Afraid, Rape, and Kick questionnaire    Fear of Current or Ex-Partner: No    Emotionally Abused: No    Physically Abused: No    Sexually Abused: No   Family Status  Relation Name Status   Mother  Deceased at age 56   Father  Deceased at age 42       from stroke   Brother  Deceased at age 41       stomach cancer   Brother  Alive   Sister  Alive   Brother  [place]   Brother  Alive   Neg Hx  (Not Specified)   Family History  Problem Relation Age of Onset   Heart attack Mother    Stroke Father    Asthma Father    Cancer Brother    Diabetes Brother    Prostate cancer Neg Hx    Kidney cancer Neg Hx    Bladder Cancer Neg Hx    No Known Allergies  Patient Care Team: [name], MD as PCP - General (Family Medicine) End, Harrell Gave, MD as PCP - Cardiology (Cardiology)   Medications: Outpatient Medications Prior to Visit  Medication Sig   amLODipine (NORVASC) 10 MG tablet Take 1 tablet (10 mg total) by mouth daily.   fluticasone (FLONASE) 50 MCG/ACT nasal spray fluticasone propionate 50 mcg/actuation nasal spray,suspension  SPRAY 2 SPRAYS INTO EACH NOSTRIL EVERY DAY   fluticasone (FLONASE) 50 MCG/ACT nasal spray Place 2 sprays into both nostrils daily.   gabapentin (NEURONTIN) 300 MG capsule gabapentin 300 mg capsule   lisinopril (ZESTRIL) 40 MG tablet TAKE 1 TABLET EVERY DAY   metoprolol succinate (TOPROL-XL) 25 MG 24 hr tablet Take 25 mg by mouth daily.   Multiple Vitamin (MULTIVITAMIN WITH MINERALS) TABS tablet Take 1 tablet by mouth daily.   omeprazole (PRILOSEC) 40 MG capsule TAKE 1 CAPSULE (40 MG TOTAL) BY MOUTH DAILY AS NEEDED (ACID REFLUX/ INDIGESTION).   [DISCONTINUED] ibuprofen (ADVIL) 600 MG tablet Take 1 tablet (600 mg total) by mouth every 6 (six) hours as needed for moderate pain. (Patient not taking: Reported on 02/06/2022)   [DISCONTINUED] lidocaine (LIDODERM) 5 % Place 1 patch onto the skin every 12  (twelve) hours. Remove & Discard patch within 12 hours or as directed by MD (Patient not taking: Reported on 02/06/2022)   [DISCONTINUED] meloxicam (MOBIC) 15 MG tablet Take 15 mg by mouth daily. (Patient not taking: Reported on 02/06/2022)   [DISCONTINUED] naproxen (NAPROSYN) 500 MG tablet Take 1 tablet (500 mg total) by mouth 2 (two) times daily with a meal. (Patient not taking: Reported on 02/06/2022)   No facility-administered medications prior to visit.    Review of Systems  Constitutional:  Positive for diaphoresis.  HENT:  Positive for hearing loss and tinnitus.   Eyes:  Positive for photophobia.  Musculoskeletal:  Positive for neck pain and neck stiffness.  All other systems reviewed and are negative.   Last hemoglobin A1c Lab Results  Component Value Date   HGBA1C 5.7 (H) 09/04/2017      Objective     BP 114/74 (BP Location: Right Arm, Patient Position: Sitting, Cuff Size: Large)   Pulse 65   Resp 16   Ht '5\' 11"'$  (1.803 m)   Wt 231 lb (104.8 kg)   SpO2 96%   BMI 32.22 kg/m  BP Readings from Last 3 Encounters:  02/06/22 114/74  12/14/21 (!) 129/94  10/04/21 120/70   Wt Readings from Last 3 Encounters:  02/06/22 231 lb (104.8 kg)  12/14/21 222 lb (100.7 kg)  10/04/21 234 lb (106.1 kg)       Physical Exam Vitals reviewed.  Constitutional:      General: He is awake.     Appearance: He is well-developed.  HENT:     Head: Normocephalic.     Right Ear: Tympanic membrane normal.     Left Ear: Tympanic membrane normal.  Eyes:     Conjunctiva/sclera: Conjunctivae normal.  Cardiovascular:     Rate and Rhythm: Normal rate and regular rhythm.     Heart sounds: Normal heart sounds.  Pulmonary:     Effort: Pulmonary effort is normal.     Breath sounds: Normal breath sounds.  Genitourinary:    Penis: Normal.      Testes: Normal.  Skin:    General: Skin is warm.  Neurological:     Mental Status: He is alert and oriented to person, place, and time.   Psychiatric:        Attention and Perception: Attention normal.        Mood and Affect: Mood normal.        Speech: Speech normal.        Behavior: Behavior normal. Behavior is cooperative.        Thought Content: Thought content normal.        Judgment: Judgment normal.       Last depression screening scores    02/06/2022    2:16 PM 09/16/2021    1:47 PM 06/27/2021    3:39 PM  PHQ 2/9 Scores  PHQ - 2 Score 0 0 0  PHQ- 9 Score 0  0   Last fall risk screening    02/06/2022    2:16 PM  Fall Risk   Falls in the past year? 0  Number falls in past yr: 0  Injury with Fall? 0  Risk for fall due to : No Fall Risks  Follow up Falls evaluation completed   Last Audit-C alcohol use screening    02/06/2022    2:15 PM  Alcohol Use Disorder Test (AUDIT)  1. How often do you have a drink containing alcohol? 0  2. How many drinks containing alcohol do you have on a typical day when you are drinking? 0  3. How often do you have six or more drinks on one occasion? 0  AUDIT-C Score 0   A score of 3 or more in women, and 4 or more in men indicates increased risk for alcohol abuse, EXCEPT if all of the points are from question 1   No results found for any visits on 02/06/22.  Assessment & Plan    Routine Health Maintenance  and Physical Exam  Exercise Activities and Dietary recommendations  Goals      DIET - EAT MORE FRUITS AND VEGETABLES        Immunization History  Administered Date(s) Administered   Influenza, High Dose Seasonal PF 02/10/2019   Influenza-Unspecified 02/24/2015, 03/11/2016, 02/27/2018   PFIZER Comirnaty(Gray Top)Covid-19 Tri-Sucrose Vaccine 09/27/2020   PFIZER(Purple Top)SARS-COV-2 Vaccination 07/07/2019, 08/03/2019, 03/11/2020   Pneumococcal Polysaccharide-23 11/24/2018   Td 05/26/1989, 05/01/1998, 12/31/2020   Tdap 12/24/2009    Health Maintenance  Topic Date Due   Zoster Vaccines- Shingrix (1 of 2) Never done   COLONOSCOPY (Pts 45-63yr Insurance  coverage will need to be confirmed)  06/27/2014   Pneumonia Vaccine 68 Years old (2 - PCV) 11/24/2019   COVID-19 Vaccine (5 - Pfizer risk series) 11/22/2020   INFLUENZA VACCINE  12/24/2021   TETANUS/TDAP  01/01/2031   Hepatitis C Screening  Completed   HPV VACCINES  Aged Out    Discussed health benefits of physical activity, and encouraged him to engage in regular exercise appropriate for his age and condition.  1. Annual physical exam Overall health is good.  Continue good exercise and diet habits.  2. Essential hypertension Good control. - Lipid panel - TSH - CBC w/Diff/Platelet - Comprehensive Metabolic Panel (CMET)  3. Gastroesophageal reflux disease without esophagitis  - Lipid panel - TSH - CBC w/Diff/Platelet - Comprehensive Metabolic Panel (CMET)  4. Infrarenal abdominal aortic aneurysm (AAA) without rupture (HCC) By cardiology - Lipid panel - TSH - CBC w/Diff/Platelet - Comprehensive Metabolic Panel (CMET)  5. Seasonal allergic rhinitis due to pollen  - Lipid panel - TSH - CBC w/Diff/Platelet - Comprehensive Metabolic Panel (CMET)  6. Neuropathy Stable today - Lipid panel - TSH - CBC w/Diff/Platelet - Comprehensive Metabolic Panel (CMET)  7. Need for immunization against influenza  - Flu Vaccine QUAD High Dose(Fluad)  8. Lumbar radiculopathy Chronic problem.  Consider using TENS unit and topicals for this pain  9. History of prostate cancer   10. Cervical radiculopathy New issue.  Presently doing well   No follow-ups on file.     I, RWilhemena Durie MD, have reviewed all documentation for this visit. The documentation on 02/10/22 for the exam, diagnosis, procedures, and orders are all accurate and complete.    Jonte Shiller GCranford Mon MD  [phone](phone) [phone](fax)  [place]

## 2022-02-07 DIAGNOSIS — K219 Gastro-esophageal reflux disease without esophagitis: Secondary | ICD-10-CM | POA: Diagnosis not present

## 2022-02-07 DIAGNOSIS — G629 Polyneuropathy, unspecified: Secondary | ICD-10-CM | POA: Diagnosis not present

## 2022-02-07 DIAGNOSIS — I10 Essential (primary) hypertension: Secondary | ICD-10-CM | POA: Diagnosis not present

## 2022-02-07 DIAGNOSIS — J301 Allergic rhinitis due to pollen: Secondary | ICD-10-CM | POA: Diagnosis not present

## 2022-02-07 DIAGNOSIS — I7143 Infrarenal abdominal aortic aneurysm, without rupture: Secondary | ICD-10-CM | POA: Diagnosis not present

## 2022-02-08 LAB — COMPREHENSIVE METABOLIC PANEL
ALT: 18 IU/L (ref 0–44)
AST: 20 IU/L (ref 0–40)
Albumin/Globulin Ratio: 1.9 (ref 1.2–2.2)
Albumin: 4.4 g/dL (ref 3.9–4.9)
Alkaline Phosphatase: 130 IU/L — ABNORMAL HIGH (ref 44–121)
BUN/Creatinine Ratio: 14 (ref 10–24)
BUN: 17 mg/dL (ref 8–27)
Bilirubin Total: 0.4 mg/dL (ref 0.0–1.2)
CO2: 21 mmol/L (ref 20–29)
Calcium: 9 mg/dL (ref 8.6–10.2)
Chloride: 104 mmol/L (ref 96–106)
Creatinine, Ser: 1.19 mg/dL (ref 0.76–1.27)
Globulin, Total: 2.3 g/dL (ref 1.5–4.5)
Glucose: 100 mg/dL — ABNORMAL HIGH (ref 70–99)
Potassium: 4.2 mmol/L (ref 3.5–5.2)
Sodium: 139 mmol/L (ref 134–144)
Total Protein: 6.7 g/dL (ref 6.0–8.5)
eGFR: 67 mL/min/{1.73_m2} (ref >59)

## 2022-02-08 LAB — CBC WITH DIFFERENTIAL/PLATELET
Basophils Absolute: 0 10*3/uL (ref 0.0–0.2)
Basos: 0 %
EOS (ABSOLUTE): 0.1 10*3/uL (ref 0.0–0.4)
Eos: 3 %
Hematocrit: 36.1 % — ABNORMAL LOW (ref 37.5–51.0)
Hemoglobin: 11.7 g/dL — ABNORMAL LOW (ref 13.0–17.7)
Immature Grans (Abs): 0 10*3/uL (ref 0.0–0.1)
Immature Granulocytes: 0 %
Lymphocytes Absolute: 1.1 10*3/uL (ref 0.7–3.1)
Lymphs: 31 %
MCH: 27.1 pg (ref 26.6–33.0)
MCHC: 32.4 g/dL (ref 31.5–35.7)
MCV: 84 fL (ref 79–97)
Monocytes Absolute: 0.4 10*3/uL (ref 0.1–0.9)
Monocytes: 12 %
Neutrophils Absolute: 1.8 10*3/uL (ref 1.4–7.0)
Neutrophils: 54 %
Platelets: 193 10*3/uL (ref 150–450)
RBC: 4.32 x10E6/uL (ref 4.14–5.80)
RDW: 13.1 % (ref 11.6–15.4)
WBC: 3.4 10*3/uL (ref 3.4–10.8)

## 2022-02-08 LAB — LIPID PANEL
Chol/HDL Ratio: 3.3 ratio (ref 0.0–5.0)
Cholesterol, Total: 173 mg/dL (ref 100–199)
HDL: 52 mg/dL (ref >39)
LDL Chol Calc (NIH): 101 mg/dL — ABNORMAL HIGH (ref 0–99)
Triglycerides: 108 mg/dL (ref 0–149)
VLDL Cholesterol Cal: 20 mg/dL (ref 5–40)

## 2022-02-08 LAB — TSH: TSH: 1.4 u[IU]/mL (ref 0.450–4.500)

## 2022-02-27 IMAGING — CT CT ANGIO HEAD-NECK (W OR W/O PERF)
2 of 11 series · 7 of 33 positions shown · non-contrast
Comparison: Brain MRI and head CT 08/02/2019.

CLINICAL DATA: 65-year-old male neurologic deficit, bilateral
tinnitus for 3 weeks. Headache.

EXAM:
CT ANGIOGRAPHY HEAD AND NECK
TECHNIQUE: Multidetector CT imaging of the head and neck was performed using
the standard protocol during bolus administration of intravenous
contrast. Multiplanar CT image reconstructions and MIPs were
obtained to evaluate the vascular anatomy. Carotid stenosis
measurements (when applicable) are obtained utilizing NASCET
criteria, using the distal internal carotid diameter as the
denominator.

[Series 6: cta head neck · axial · 0.53mm/px · z∈[+458,+594]mm · 2 of 204 slices shown]
[im 68/204  soft-tissue]
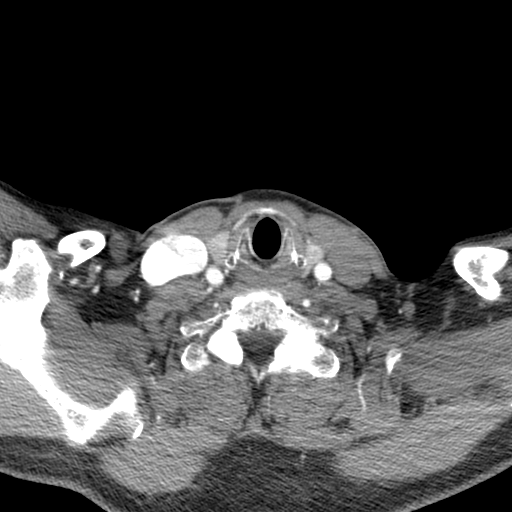
[im 136/204  soft-tissue]
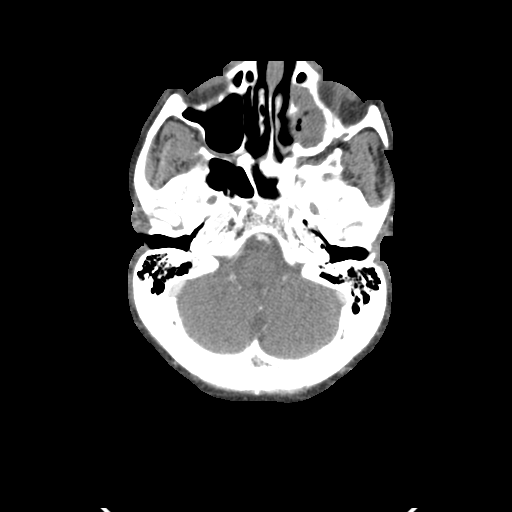

[Series 8: ax thin · axial · 0.48mm/px · z∈[+391,+659]mm · 5 of 404 slices shown]
[im 68/404  soft-tissue]
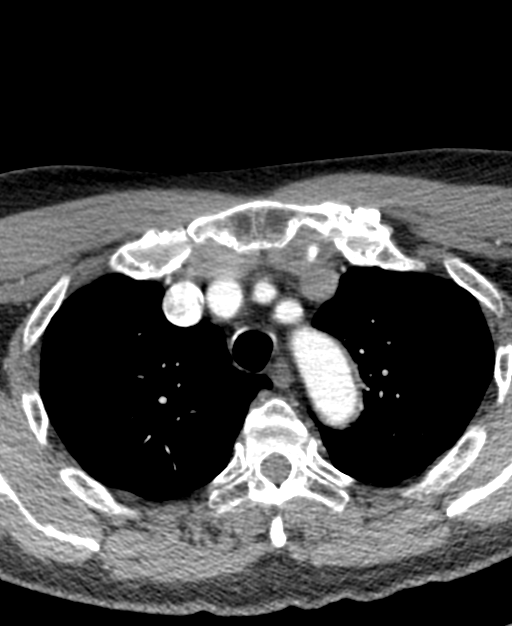
[im 135/404  bone]
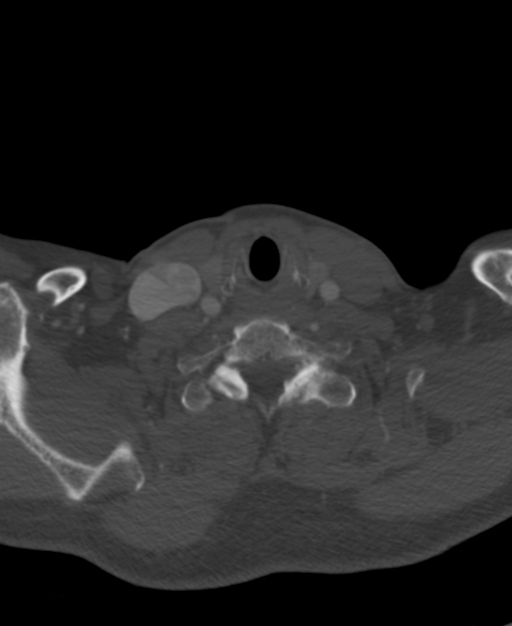
[im 202/404  soft-tissue]
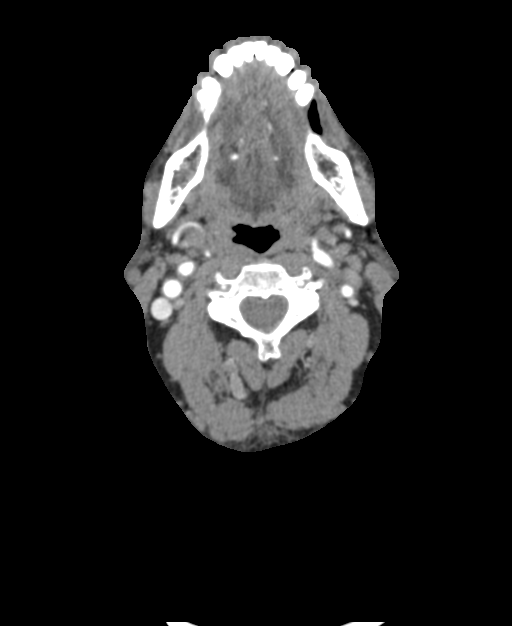
[im 269/404  bone]
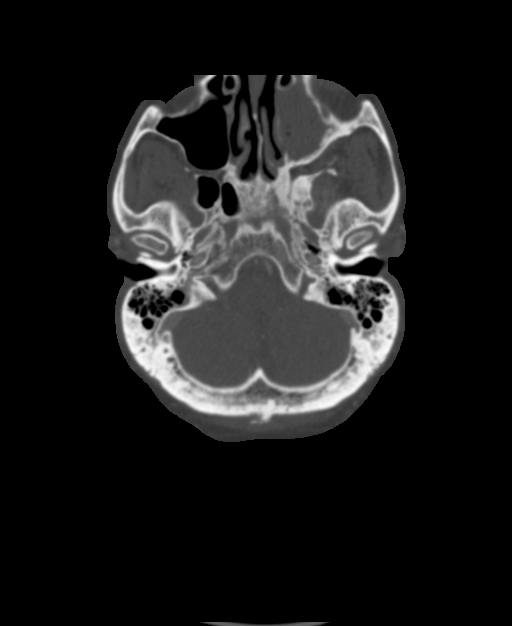
[im 336/404  soft-tissue]
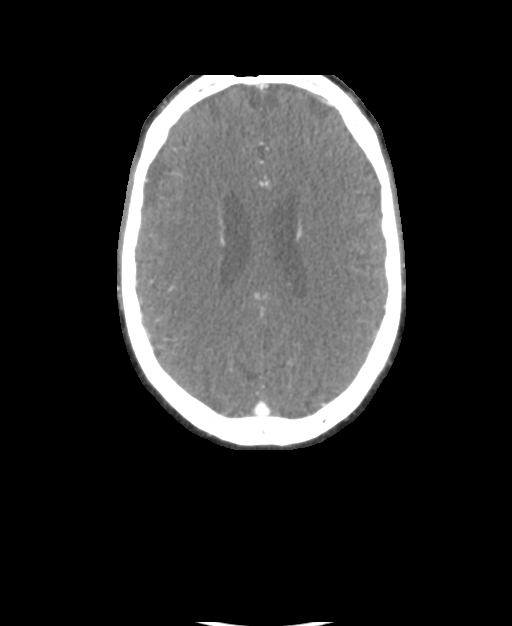

[7 of 33 positions shown; findings below may reference images not displayed]

RADIATION DOSE REDUCTION: This exam was performed according to the
departmental dose-optimization program which includes automated
exposure control, adjustment of the mA and/or kV according to
patient size and/or use of iterative reconstruction technique.

CONTRAST:  75mL OMNIPAQUE IOHEXOL 350 MG/ML SOLN
FINDINGS: CT HEAD

Brain: Cerebral volume is stable and within normal limits for age.
No midline shift, ventriculomegaly, mass effect, evidence of mass
lesion, intracranial hemorrhage or evidence of cortically based
acute infarction. Mild for age scattered mostly subcortical white
matter patchy hypodensity appears stable. No cortical
encephalomalacia identified.

Calvarium and skull base: No acute osseous abnormality identified.

Paranasal sinuses: Chronic left maxillary sinusitis with
mucoperiosteal thickening and nearly airless sinus. Tympanic
cavities and mastoids remain clear. Other paranasal sinuses remain
clear.

Orbits: Postoperative changes to the left globe since 9991. No acute
orbit or scalp soft tissue findings.

CTA NECK

Skeleton: Mild for age cervical spine degeneration at most levels.
No acute osseous abnormality identified.

Upper chest: Paraseptal emphysema in the lung apices. Small volume
retained secretions in the carina and bilateral mainstem bronchi.
But the visible upper lungs are clear. No superior mediastinal
lymphadenopathy.

Other neck: Subcentimeter hypodense right thyroid nodule. Not
clinically significant; no follow-up imaging recommended (ref: [HOSPITAL]. [DATE]): 143-50).Glottis is closed. Otherwise
negative CT appearance of the neck soft tissues.

Aortic arch: 3 vessel arch configuration. Mild arch atherosclerosis.

Right carotid system: Minor right CCA and right carotid bifurcation
atherosclerosis with no stenosis.

Left carotid system: Minimal left CCA atherosclerosis. Mild
calcified plaque at the left ICA origin. Mild soft and calcified
plaque at the bulb. No stenosis.

Vertebral arteries:
Tortuous proximal right subclavian artery with a kinked appearance,
but without plaque or stenosis. Normal right vertebral artery
origin. Non dominant right vertebral artery is diminutive but
remains patent to the skull base.

Negative proximal left subclavian artery, left vertebral artery
origin. Dominant left vertebral artery is patent to the skull base
without plaque or stenosis.

CTA HEAD

Posterior circulation: Dominant left vertebral V4 segment, the
diminutive right vertebral continues to the vertebrobasilar
junction. Normal left PICA origin. Dominant appearing right AICA. No
distal vertebral plaque or stenosis. Patent although diminutive
basilar artery, with a fetal type origin of bilateral PCAs. Patent
basilar tip, SCA origins. Bilateral PCA branches are within normal
limits.

Anterior circulation: Both ICA siphons are patent. No siphon plaque
or stenosis. Normal ophthalmic and posterior communicating artery
origins. Patent carotid termini, MCA and ACA origins. Diminutive or
absent anterior communicating artery. Bilateral ACA branches are
within normal limits. Left MCA M1 segment and bifurcation are patent
without stenosis. Right MCA M1 segment and trifurcation are patent
without stenosis. Bilateral MCA branches are within normal limits.

No abnormal vascularity at the internal auditory regions identified.

Venous sinuses: Patent.

Anatomic variants: Fetal type bilateral PCA origins with diminutive
vertebrobasilar system, and dominant left vertebral artery.

Review of the MIP images confirms the above findings
IMPRESSION: 1. Negative for large vessel occlusion. Mild for age atherosclerosis
in the head and neck with no arterial stenosis.
2. No acute intracranial abnormality. Stable mild for age
nonspecific cerebral white matter changes.
3. Chronic left maxillary sinusitis. Middle ears and mastoids are
clear.
4. Apical emphysema ([id number]), with small volume retained
secretions in the carina and bilateral mainstem bronchi.

## 2022-03-01 ENCOUNTER — Ambulatory Visit
Admission: EM | Admit: 2022-03-01 | Discharge: 2022-03-01 | Disposition: A | Discharge status: Home / Self Care | Payer: Medicare HMO | Attending: Emergency Medicine | Admitting: Emergency Medicine

## 2022-03-01 DIAGNOSIS — U071 COVID-19: Secondary | ICD-10-CM | POA: Diagnosis not present

## 2022-03-01 LAB — SARS CORONAVIRUS 2 BY RT PCR: SARS Coronavirus 2 by RT PCR: POSITIVE — AB

## 2022-03-01 NOTE — ED Provider Notes (Signed)
[facility]    CSN: [id number] Arrival date & time: 03/01/22  1404      History   Chief Complaint Chief Complaint  Patient presents with   Generalized Body Aches   Headache    HPI Jack Weaver is a 66 y.o. male.   HPI  66 year old male here requesting COVID testing.  Patient received the COVID booster vaccine yesterday and then today he developed headaches, body aches, some nasal congestion, and a mild intermittent nonproductive cough.  He has not had a fever, runny nose, shortness breath, or wheezing.  Past Medical History:  Diagnosis Date   Arthritis    GERD (gastroesophageal reflux disease)    Hypertension    Prostate cancer ([place])    Sleep apnea    no CPAP    Patient Active Problem List   Diagnosis Date Noted   Hypertrophy of inferior nasal turbinate 07/08/2021   Tinnitus of both ears 06/28/2021   Encounter for smoking cessation counseling 06/28/2021   Acute sinusitis 03/12/2021   Bradycardia 03/12/2021   Carpal tunnel syndrome 03/12/2021   Cough 03/12/2021   Headache 03/12/2021   Nausea with vomiting 03/12/2021   Skin sensation disturbance 03/12/2021   Smell and taste disorder 03/12/2021   Sprain of knee 03/12/2021   Visual disturbance 03/12/2021   Screening for prostate cancer 03/12/2021   Lumbar radiculopathy 09/24/2020   AAA (abdominal aortic aneurysm) ([place]) 08/29/2020   Palpitations 03/29/2020   PVC's (premature ventricular contractions) 03/29/2020   Shortness of breath 03/29/2020   Postoperative ileus ([place]) 10/15/2018   Right lower quadrant abdominal pain    Prostate cancer ([place]) 10/11/2018   Lumbar sprain 09/04/2017   Traumatic rupture of quadriceps tendon 09/02/2017   Impacted cerumen 06/20/2015   Bursitis of hip 05/31/2015   Allergic rhinitis due to pollen 01/26/2015   Disequilibrium 01/26/2015   ED (erectile dysfunction) of organic origin 11/18/2014   Essential hypertension 11/18/2014   Neuropathy 11/18/2014   Male  erectile dysfunction, unspecified 11/18/2014   Allergic rhinitis 11/14/2014   Hematuria 12/24/2009   Urinary tract infection 12/24/2009   Arthropathia 11/26/2009   Arthropathy, unspecified 11/26/2009   Sebaceous cyst 10/06/2008   History of tobacco use 07/31/2008   Acid reflux 07/26/2008   Arthritis, degenerative 07/26/2008   Gastro-esophageal reflux disease without esophagitis 07/26/2008   Apnea, sleep 06/30/2008    Past Surgical History:  Procedure Laterality Date   APPENDECTOMY     At age 62   CATARACT EXTRACTION W/PHACO Left 08/08/2020   Procedure: CATARACT EXTRACTION PHACO AND INTRAOCULAR LENS PLACEMENT (IOC) LEFT 5.62 01:00.4 903%;  Surgeon: Leandrew Koyanagi, MD;  Location: [facility];  Service: Ophthalmology;  Laterality: Left;   HEMORROIDECTOMY  1930   PELVIC LYMPH NODE DISSECTION Bilateral 10/11/2018   Procedure: PELVIC LYMPH NODE DISSECTION;  Surgeon: Hollice Espy, MD;  Location: [facility];  Service: Urology;  Laterality: Bilateral;   QUADRICEPS TENDON REPAIR Left 09/09/2017   Procedure: REPAIR QUADRICEP TENDON;  Surgeon: Earnestine Leys, MD;  Location: [facility];  Service: Orthopedics;  Laterality: Left;   ROBOT ASSISTED LAPAROSCOPIC RADICAL PROSTATECTOMY N/A 10/11/2018   Procedure: ROBOTIC ASSISTED LAPAROSCOPIC RADICAL PROSTATECTOMY;  Surgeon: Hollice Espy, MD;  Location: [facility];  Service: Urology;  Laterality: N/A;       Home Medications    Prior to Admission medications   Medication Sig Start Date End Date Taking? Authorizing Provider  amLODipine (NORVASC) 10 MG tablet Take 1 tablet (10 mg total) by mouth daily. 12/18/21  Yes Kathlen Mody,  Cadence H, PA-C  fluticasone (FLONASE) 50 MCG/ACT nasal spray Place 2 sprays into both nostrils daily. 10/07/21  Yes [name], MD  gabapentin (NEURONTIN) 300 MG capsule gabapentin 300 mg capsule   Yes [provider]  lisinopril (ZESTRIL) 40 MG tablet TAKE 1 TABLET EVERY DAY 04/19/21  Yes [name], MD  metoprolol succinate (TOPROL-XL) 25 MG 24 hr tablet Take 25 mg by mouth daily.   Yes [provider]  Multiple Vitamin (MULTIVITAMIN WITH MINERALS) TABS tablet Take 1 tablet by mouth daily.   Yes [provider]  omeprazole (PRILOSEC) 40 MG capsule TAKE 1 CAPSULE (40 MG TOTAL) BY MOUTH DAILY AS NEEDED (ACID REFLUX/ INDIGESTION). 05/28/21  Yes [name], MD  fluticasone [facility]) 50 MCG/ACT nasal spray fluticasone propionate 50 mcg/actuation nasal spray,suspension  SPRAY 2 SPRAYS INTO EACH NOSTRIL EVERY DAY 09/30/20   [provider]    Family History Family History  Problem Relation Age of Onset   Heart attack Mother    Stroke Father    Asthma Father    Cancer Brother    Diabetes Brother    Prostate cancer Neg Hx    Kidney cancer Neg Hx    Bladder Cancer Neg Hx     Social History Social History   Tobacco Use   Smoking status: Some Days    Types: Cigars   Smokeless tobacco: Never  Vaping Use   Vaping Use: Never used  Substance Use Topics   Alcohol use: Yes    Alcohol/week: 2.0 standard drinks of alcohol    Types: 2 Cans of beer per week    Comment: occasionally   Drug use: No     Allergies   Patient has no known allergies.   Review of Systems Review of Systems  Constitutional:  Negative for fever.  HENT:  Positive for congestion. Negative for ear pain, rhinorrhea and sore throat.   Respiratory:  Positive for cough. Negative for shortness of breath and wheezing.   Musculoskeletal:  Positive for arthralgias and myalgias.  Neurological:  Positive for headaches.  Hematological: Negative.   Psychiatric/Behavioral: Negative.       Physical Exam Triage Vital Signs ED Triage Vitals  Enc Vitals Group     BP 03/01/22 1424 122/74     Pulse Rate 03/01/22 1424 63     Resp --      Temp 03/01/22 1424 98.8 F (37.1 C)     Temp Source 03/01/22 1424 Oral     SpO2 03/01/22 1424 100 %     Weight 03/01/22 1422 220 lb  (99.8 kg)     Height 03/01/22 1422 '5\' 11"'$  (1.803 m)     Head Circumference --      Peak Flow --      Pain Score 03/01/22 1422 3     Pain Loc --      Pain Edu? --      Excl. in [place]? --    No data found.  Updated Vital Signs BP 122/74 (BP Location: Left Arm)   Pulse 63   Temp 98.8 F (37.1 C) (Oral)   Ht '5\' 11"'$  (1.803 m)   Wt 220 lb (99.8 kg)   SpO2 100%   BMI 30.68 kg/m   Visual Acuity Right Eye Distance:   Left Eye Distance:   Bilateral Distance:    Right Eye Near:   Left Eye Near:    Bilateral Near:     Physical Exam  Vitals and nursing note reviewed.  Constitutional:      Appearance: Normal appearance. He is not ill-appearing.  HENT:     Head: Normocephalic and atraumatic.     Right Ear: Tympanic membrane, ear canal and external ear normal. There is no impacted cerumen.     Left Ear: Tympanic membrane, ear canal and external ear normal. There is no impacted cerumen.     Nose: Congestion and rhinorrhea present.     Mouth/Throat:     Mouth: Mucous membranes are moist.     Pharynx: Oropharynx is clear. No posterior oropharyngeal erythema.  Cardiovascular:     Rate and Rhythm: Normal rate and regular rhythm.     Pulses: Normal pulses.     Heart sounds: Normal heart sounds. No murmur heard.    No friction rub. No gallop.  Pulmonary:     Effort: Pulmonary effort is normal.     Breath sounds: Normal breath sounds. No wheezing, rhonchi or rales.  Musculoskeletal:     Cervical back: Normal range of motion and neck supple.  Lymphadenopathy:     Cervical: No cervical adenopathy.  Skin:    General: Skin is warm and dry.     Capillary Refill: Capillary refill takes less than 2 seconds.     Findings: No erythema or rash.  Neurological:     General: No focal deficit present.     Mental Status: He is alert and oriented to person, place, and time.  Psychiatric:        Mood and Affect: Mood normal.        Behavior: Behavior normal.        Thought Content: Thought  content normal.        Judgment: Judgment normal.      UC Treatments / Results  Labs (all labs ordered are listed, but only abnormal results are displayed) Labs Reviewed  SARS CORONAVIRUS 2 BY RT PCR - Abnormal; Notable for the following components:      Result Value   SARS Coronavirus 2 by RT PCR POSITIVE (*)    All other components within normal limits    EKG   Radiology No results found.  Procedures Procedures (including critical care time)  Medications Ordered in UC Medications - No data to display  Initial Impression / Assessment and Plan / UC Course  I have reviewed the triage vital signs and the nursing notes.  Pertinent labs & imaging results that were available during my care of the patient were reviewed by me and considered in my medical decision making (see chart for details).   Patient is a nontoxic-appearing 66 year old male here requesting COVID testing after developing headache, body aches, nasal congestion, and an intermittent nonproductive cough after receiving his COVID booster vaccine yesterday.  Patient is in no acute distress and he is able to speak in full sentences dyspnea or tachypnea.  He does have some mild erythema and edema of his nasal mucosa but no rhinorrhea present.  Oropharyngeal exam is benign.  Cardiopulmonary exam feels full lung sounds in all fields.  I suspect that patient symptoms are a immune response to the COVID-vaccine.  I will order a COVID PCR to look for the presence of concomitant infection.   Final Clinical Impressions(s) / UC Diagnoses   Final diagnoses:  [id number]     Discharge Instructions      You have tested positive for COVID-19 but this may very well be because you received your vaccination yesterday.  You need to quarantine for 5 days from onset of your symptoms.  Use over-the-counter Tylenol and ibuprofen as needed for body aches and headache.  If you develop any shortness of breath, especially at rest, you  feel you cannot catch her breath, you cannot speak in full sentences, or your lips begin turning blue you need to go to the ER for evaluation.     ED Prescriptions   None    PDMP not reviewed this encounter.   Margarette Canada, NP 03/01/22 1502

## 2022-03-01 NOTE — Discharge Instructions (Signed)
You have tested positive for COVID-19 but this may very well be because you received your vaccination yesterday.    You need to quarantine for 5 days from onset of your symptoms.  Use over-the-counter Tylenol and ibuprofen as needed for body aches and headache.  If you develop any shortness of breath, especially at rest, you feel you cannot catch her breath, you cannot speak in full sentences, or your lips begin turning blue you need to go to the ER for evaluation.

## 2022-03-01 NOTE — ED Triage Notes (Signed)
Pt states he received the covid booster vaccine yesterday, c/o body aches, headache onset this morning. Pt requesting a covid test.

## 2022-03-17 DIAGNOSIS — L728 Other follicular cysts of the skin and subcutaneous tissue: Secondary | ICD-10-CM | POA: Diagnosis not present

## 2022-03-20 LAB — FECAL OCCULT BLOOD, IMMUNOCHEMICAL: IFOBT: NEGATIVE

## 2022-03-21 IMAGING — US US SCROTUM W/ DOPPLER COMPLETE
1 series · 13 of 25 positions shown · non-contrast
Comparison: 06/27/2018

CLINICAL DATA: Left scrotal pain

EXAM:
SCROTAL ULTRASOUND
DOPPLER ULTRASOUND OF THE TESTICLES
TECHNIQUE: Complete ultrasound examination of the testicles, epididymis, and
other scrotal structures was performed. Color and spectral Doppler
ultrasound were also utilized to evaluate blood flow to the
testicles.

[Series 1: us scrotum w/doppler · 55 acquisitions, 13 frames shown]
[im 1/55]
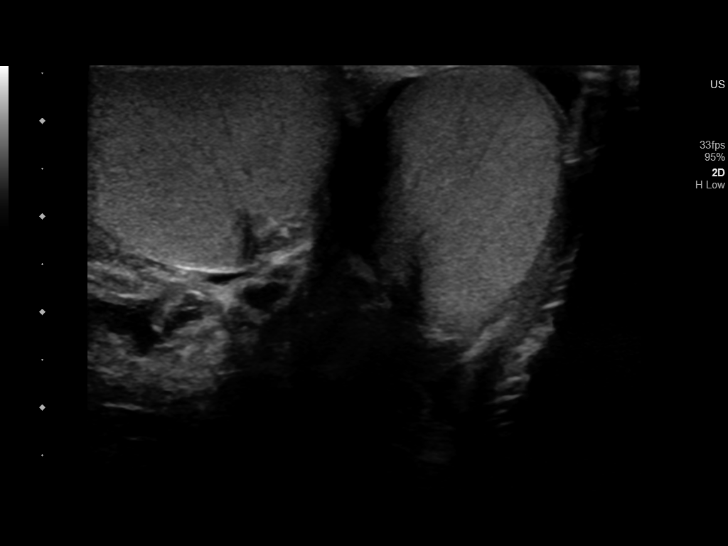
[im 5/55]
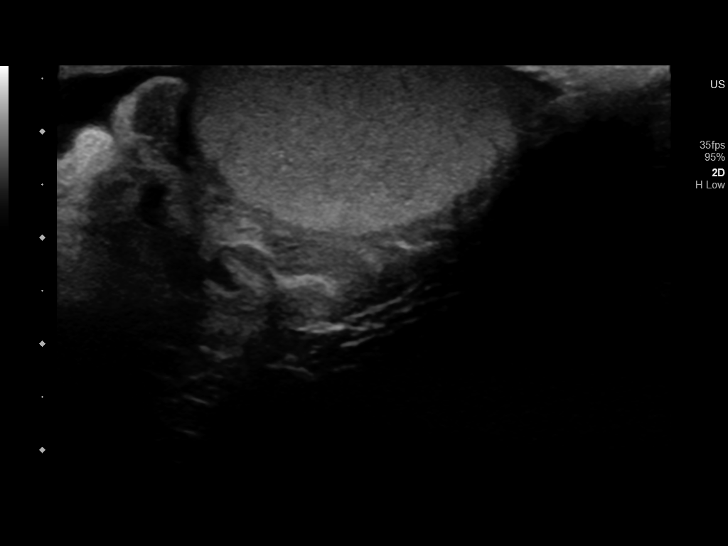
[im 10/55]
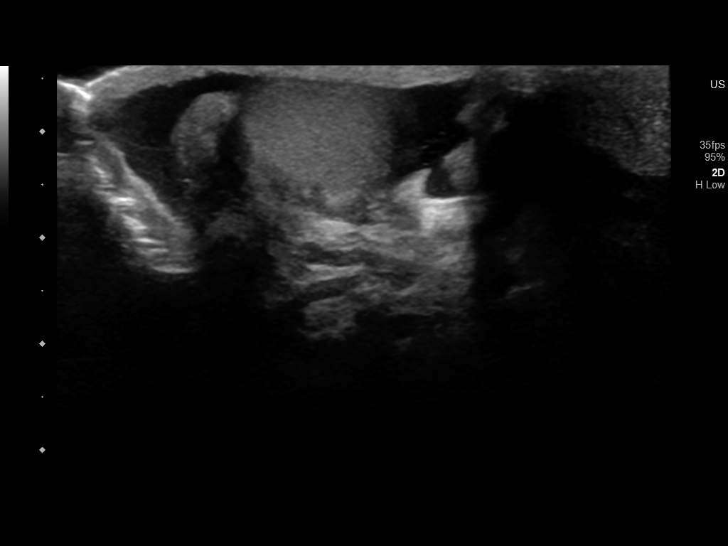
[im 14/55]
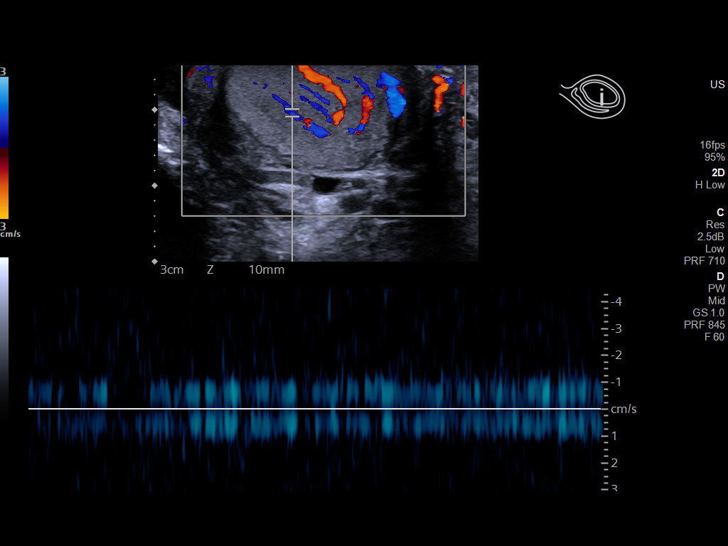
[im 19/55]
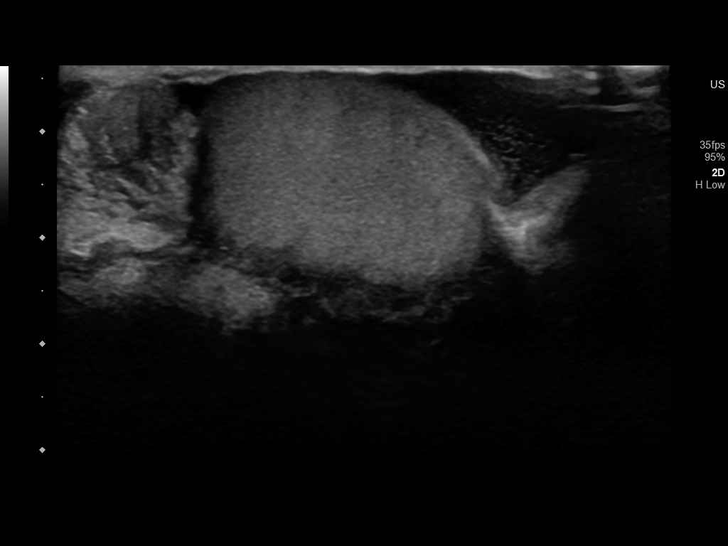
[im 23/55]
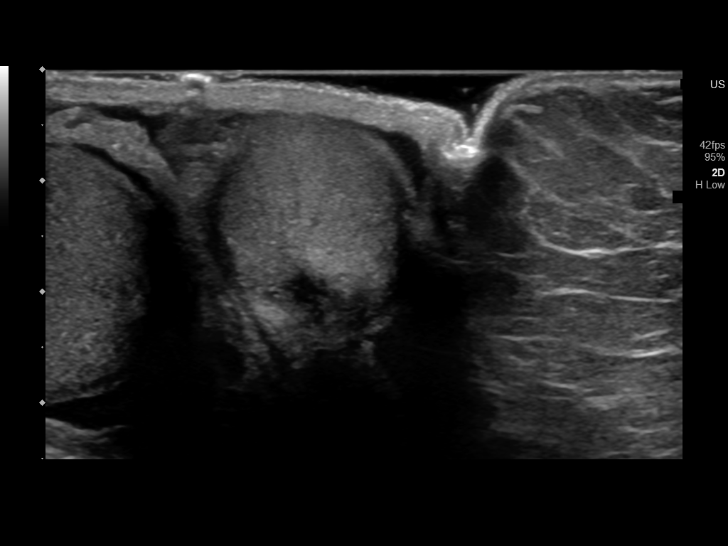
[im 28/55]
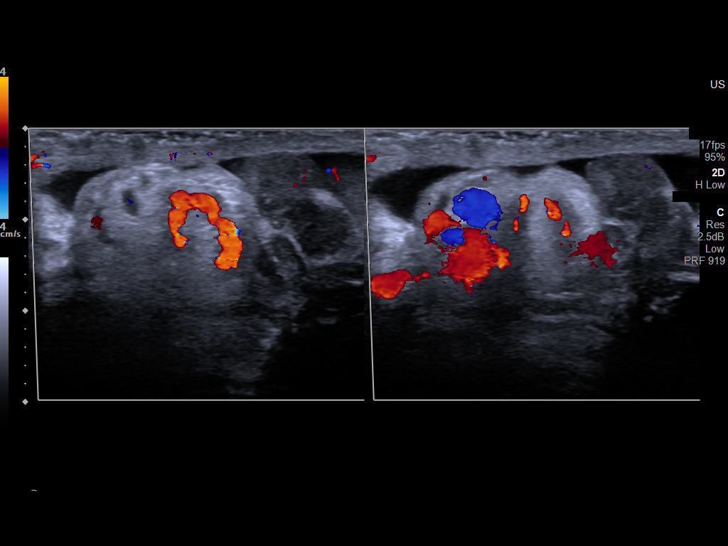
[im 32/55]
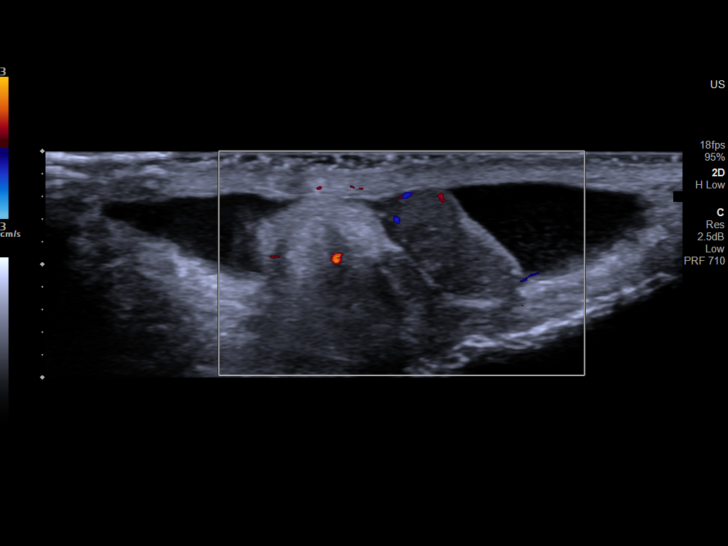
[im 37/55]
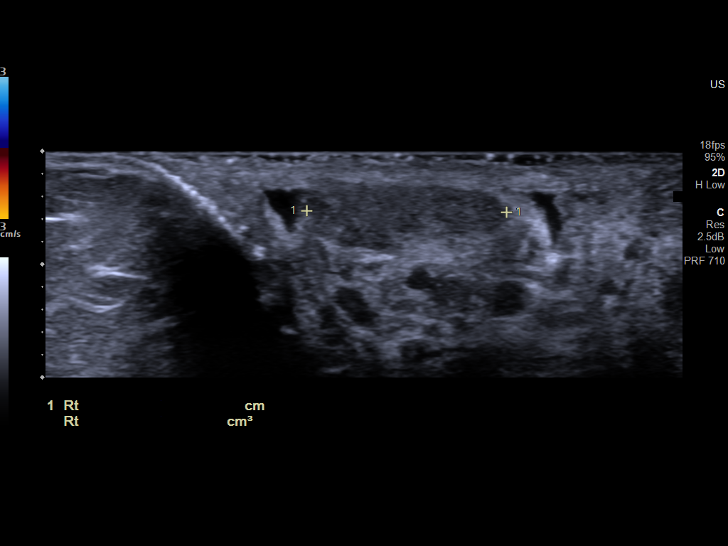
[im 41/55]
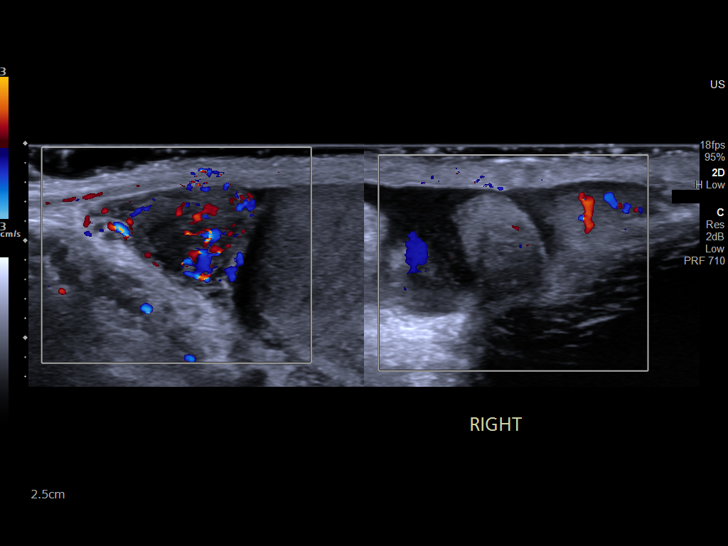
[im 46/55]
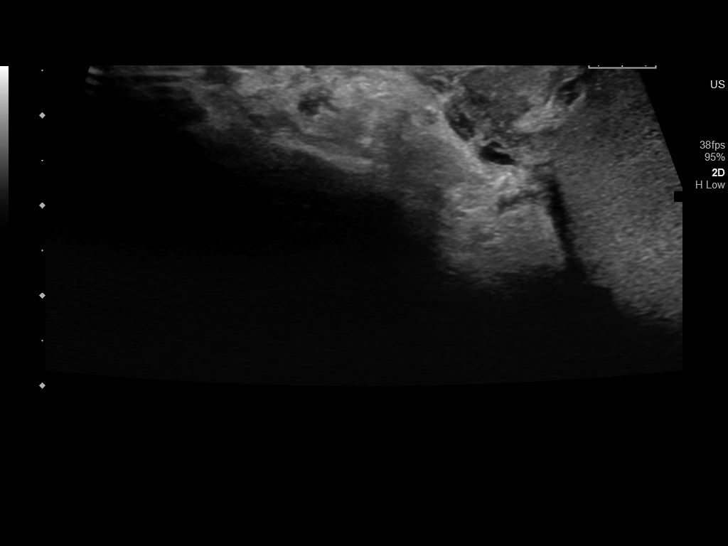
[im 50/55]
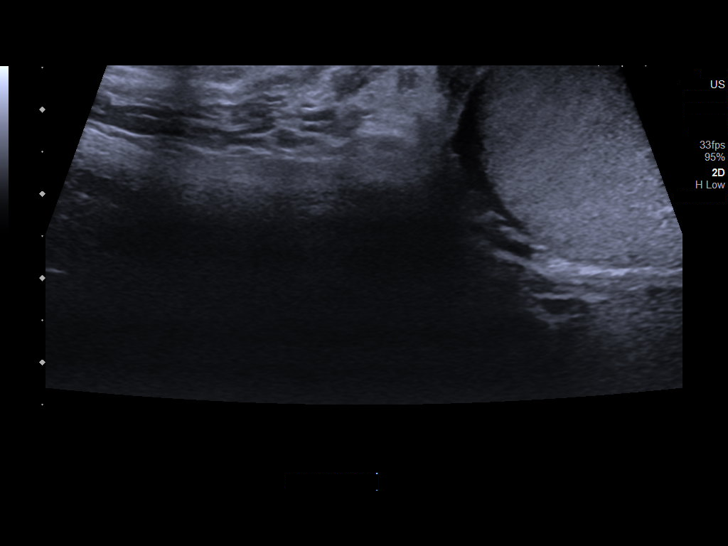
[im 55/55]
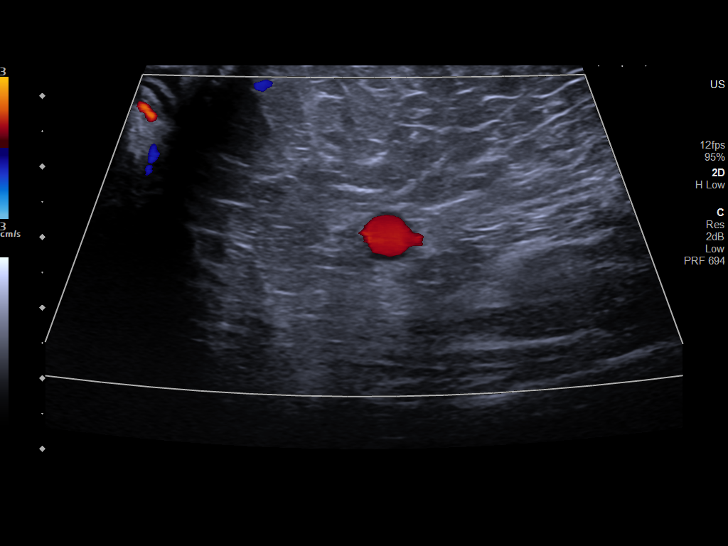

[13 of 25 positions shown; findings below may reference images not displayed]

FINDINGS: Right testicle

Measurements: 4 x 2 x 2.8 cm.  No mass or microlithiasis visualized.

Left testicle

Measurements: 4.1 x 2.5 x 1.7 cm. No mass or microlithiasis
visualized.

Right epididymis:  Normal in size and appearance.

Left epididymis:  There is increased vascular flow

Hydrocele:  Small hydrocele is seen on both sides.

Varicocele: There is ectasia of vessels in the pampiniform plexus on
both sides suggesting varicocele, slightly more prominent on the
right side.

Pulsed Doppler interrogation of both testes demonstrates normal low
resistance arterial and venous waveforms bilaterally.
IMPRESSION: There is homogeneous echogenicity in both testes. There is vascular
flow in both testes.

There is increased vascular flow in the left epididymis suggesting
possible epididymitis.

Small bilateral hydrocele. Bilateral varicocele, more prominent on
the right side.

## 2022-03-28 ENCOUNTER — Encounter: Payer: Self-pay | Admitting: Family Medicine

## 2022-03-31 ENCOUNTER — Other Ambulatory Visit: Payer: Medicare HMO

## 2022-03-31 DIAGNOSIS — C61 Malignant neoplasm of prostate: Secondary | ICD-10-CM

## 2022-04-01 LAB — PSA: Prostate Specific Ag, Serum: <0.1 ng/mL (ref 0.0–4.0)

## 2022-04-02 ENCOUNTER — Telehealth: Payer: Self-pay

## 2022-04-02 ENCOUNTER — Ambulatory Visit: Payer: Medicare HMO | Admitting: Urology

## 2022-04-02 NOTE — Telephone Encounter (Signed)
Eligard precert  Authorization not required.  PTC#[id number]

## 2022-04-08 ENCOUNTER — Ambulatory Visit: Payer: Medicare HMO | Admitting: Urology

## 2022-04-08 VITALS — BP 133/86 | HR 63 | Ht 71.0 in | Wt 220.0 lb

## 2022-04-08 DIAGNOSIS — R635 Abnormal weight gain: Secondary | ICD-10-CM

## 2022-04-08 DIAGNOSIS — C61 Malignant neoplasm of prostate: Secondary | ICD-10-CM | POA: Diagnosis not present

## 2022-04-08 DIAGNOSIS — N529 Male erectile dysfunction, unspecified: Secondary | ICD-10-CM

## 2022-04-08 MED ORDER — LEUPROLIDE ACETATE (6 MONTH) 45 MG ~~LOC~~ KIT
45.0000 mg | PACK | Freq: Once | SUBCUTANEOUS | Status: AC
  Administered 2022-04-08: 45 mg via SUBCUTANEOUS

## 2022-04-08 NOTE — Progress Notes (Signed)
04/08/2022 12:54 PM   [name] 04/02/1956 [id number]  Referring provider: [name], MD No address on file  Chief Complaint  Patient presents with   Follow-up    6 month follow up with PSA      HPI: 66 year old male with personal history of prostate cancer with history outlined below who returns today for PSA/Eligard treatment.  He underwent radical prostatectomy non-nerve sparing with bilateral pelvic lymph node dissection on 10/01/2018.  Surgical pathology was consistent with Gleason 3+5 disease, pT3a N1.  He was noted to have extraprostatic extension with positive posterior margins both left and right.  He is also noted to have 2 of 3 lymph nodes on the left with focal metastatic disease.  0 of 3 lymph nodes on the right.  No bladder neck involvement.  No seminal vesicle involvement.   He underwent staging axiom PET scan on 11/08/2018 that was negative for metastatic disease and was started on Lupron. He declined whole pelvic radiation.    Testosterone indicated he did not achieve castrate levels in 2021 and a second dose of ADT was administered.         His PSA had risen to 0.7 In 2021.    He did not tolerate apalutamide as additional adjuvant treatment to his ADT.   Today, PSA remains undetectable, less than 0.1 on 03/31/2022.  He does mention today that he has some incontinence sometimes when he giggles but otherwise is fairly dry.  He has not been doing his pelvic floor exercises routinely.  He also mentions that he has been struggling with erectile dysfunction which was previously discussed.  Now on recollection, he does have sildenafil at home is never tried it.  He also mentions today about weight gain, particularly abdominal in nature.  He is taking men's One-A-Day with calcium and vitamin D.    PMH: Past Medical History:  Diagnosis Date   Arthritis    GERD (gastroesophageal reflux disease)    Hypertension    Prostate cancer ([place])    Sleep apnea     no CPAP    Surgical History: Past Surgical History:  Procedure Laterality Date   APPENDECTOMY     At age 26   CATARACT EXTRACTION W/PHACO Left 08/08/2020   Procedure: CATARACT EXTRACTION PHACO AND INTRAOCULAR LENS PLACEMENT (IOC) LEFT 5.62 01:00.4 903%;  Surgeon: Leandrew Koyanagi, MD;  Location: [facility];  Service: Ophthalmology;  Laterality: Left;   HEMORROIDECTOMY  1930   PELVIC LYMPH NODE DISSECTION Bilateral 10/11/2018   Procedure: PELVIC LYMPH NODE DISSECTION;  Surgeon: Hollice Espy, MD;  Location: [facility];  Service: Urology;  Laterality: Bilateral;   QUADRICEPS TENDON REPAIR Left 09/09/2017   Procedure: REPAIR QUADRICEP TENDON;  Surgeon: Earnestine Leys, MD;  Location: [facility];  Service: Orthopedics;  Laterality: Left;   ROBOT ASSISTED LAPAROSCOPIC RADICAL PROSTATECTOMY N/A 10/11/2018   Procedure: ROBOTIC ASSISTED LAPAROSCOPIC RADICAL PROSTATECTOMY;  Surgeon: Hollice Espy, MD;  Location: [facility];  Service: Urology;  Laterality: N/A;    Home Medications:  Allergies as of 04/08/2022   No Known Allergies      Medication List        Accurate as of April 08, 2022 12:54 PM. If you have any questions, ask your nurse or doctor.          amLODipine 10 MG tablet Commonly known as: NORVASC Take 1 tablet (10 mg total) by mouth daily.   carvedilol 25 MG tablet Commonly known as: COREG Take 25 mg  by mouth 2 (two) times daily.   fluticasone 50 MCG/ACT nasal spray Commonly known as: FLONASE fluticasone propionate 50 mcg/actuation nasal spray,suspension  SPRAY 2 SPRAYS INTO EACH NOSTRIL EVERY DAY   fluticasone 50 MCG/ACT nasal spray Commonly known as: FLONASE Place 2 sprays into both nostrils daily.   gabapentin 300 MG capsule Commonly known as: NEURONTIN gabapentin 300 mg capsule   lisinopril 40 MG tablet Commonly known as: ZESTRIL TAKE 1 TABLET EVERY DAY   meclizine 12.5 MG tablet Commonly known as: ANTIVERT TAKE 1 TAB BY MOUTH 3 (THREE)  TIMES DAILY AS NEEDED FOR DIZZINESS FOR UP TO 10 DAYS   methocarbamol 750 MG tablet Commonly known as: ROBAXIN Take 750 mg by mouth 2 (two) times daily.   metoprolol succinate 25 MG 24 hr tablet Commonly known as: TOPROL-XL Take 25 mg by mouth daily.   multivitamin with minerals Tabs tablet Take 1 tablet by mouth daily.   omeprazole 40 MG capsule Commonly known as: PRILOSEC TAKE 1 CAPSULE (40 MG TOTAL) BY MOUTH DAILY AS NEEDED (ACID REFLUX/ INDIGESTION).        Allergies: No Known Allergies  Family History: Family History  Problem Relation Age of Onset   Heart attack Mother    Stroke Father    Asthma Father    Cancer Brother    Diabetes Brother    Prostate cancer Neg Hx    Kidney cancer Neg Hx    Bladder Cancer Neg Hx     Social History:  reports that he has been smoking cigars. He has never used smokeless tobacco. He reports current alcohol use of about 2.0 standard drinks of alcohol per week. He reports that he does not use drugs.   Physical Exam: BP 133/86   Pulse 63   Ht '5\' 11"'$  (1.803 m)   Wt 220 lb (99.8 kg)   BMI 30.68 kg/m   Constitutional:  Alert and oriented, No acute distress. HEENT: [place] AT, moist mucus membranes.  Trachea midline, no masses. Cardiovascular: No clubbing, cyanosis, or edema. Respiratory: Normal respiratory effort, no increased work of breathing. GI: Abdomen is soft, nontender, nondistended, no abdominal masses GU: No CVA tenderness Skin: No rashes, bruises or suspicious lesions. Neurologic: Grossly intact, no focal deficits, moving all 4 extremities. Psychiatric: Normal mood and affect.  Laboratory Data: Lab Results  Component Value Date   WBC 3.4 02/07/2022   HGB 11.7 (L) 02/07/2022   HCT 36.1 (L) 02/07/2022   MCV 84 02/07/2022   PLT 193 02/07/2022    Lab Results  Component Value Date   CREATININE 1.19 02/07/2022     Assessment & Plan:    1. Prostate cancer ([place]) - Complex, currently castrate sensitive metastatic to  the pelvic lymph nodes  - Managed on ADT alone for biochemical occurrence, unable to tolerate  apalutamide and not interested in addition of other oral agents at this time - Continue ADT indefinitely consider addition of oral med in future if PSA begins to rise  - PSA undetectable  - leuprolide (6 Month) (ELIGARD) injection 45 mg administered today - PSA; Future  2. ED (erectile dysfunction) of organic origin -Advised to try sildenafil again, would recommend higher dose, 100 mg at least 1 hour prior to sexual activity ideally on empty stomach -Patient indicated that he does not need a prescription for this  3. Weight gain -We discussed is a common side effect of ADT, increased weight gain primarily centrally and loss of muscle mass -We discussed the importance of healthy  diet and exercise and strategies to combat this   F/u 6 mo PSA/ Eligard  Hollice Espy, MD  [facility] [address] [place], [place] [address] [phone]

## 2022-04-11 ENCOUNTER — Other Ambulatory Visit: Payer: Self-pay | Admitting: Family Medicine

## 2022-04-11 ENCOUNTER — Ambulatory Visit: Payer: Medicare HMO | Attending: Medical | Admitting: Medical

## 2022-04-11 ENCOUNTER — Encounter: Payer: Self-pay | Admitting: Medical

## 2022-04-11 VITALS — BP 138/82 | HR 55 | Ht 71.0 in | Wt 233.6 lb

## 2022-04-11 DIAGNOSIS — I10 Essential (primary) hypertension: Secondary | ICD-10-CM

## 2022-04-11 DIAGNOSIS — R600 Localized edema: Secondary | ICD-10-CM | POA: Diagnosis not present

## 2022-04-11 MED ORDER — METOPROLOL SUCCINATE ER 25 MG PO TB24: 25.0000 mg | ORAL_TABLET | Freq: Every day | ORAL | 1 refills | Status: DC

## 2022-04-11 NOTE — Patient Instructions (Signed)
Medication Instructions:  Your physician recommends that you continue on your current medications as directed. Please refer to the Current Medication list given to you today.  *If you need a refill on your cardiac medications before your next appointment, please call your pharmacy*    Follow-Up: At [facility], you and your health needs are our priority.  As part of our continuing mission to provide you with exceptional heart care, we have created designated Provider Care Teams.  These Care Teams include your primary Cardiologist (physician) and Advanced Practice Providers (APPs -  Physician Assistants and Nurse Practitioners) who all work together to provide you with the care you need, when you need it.  We recommend signing up for the patient portal called "MyChart".  Sign up information is provided on this After Visit Summary.  MyChart is used to connect with patients for Virtual Visits (Telemedicine).  Patients are able to view lab/test results, encounter notes, upcoming appointments, etc.  Non-urgent messages can be sent to your provider as well.   To learn more about what you can do with MyChart, go to [email or web address].    Your next appointment:   1 year(s)  The format for your next appointment:   In Person  Provider:   You may see Nelva Bush, MD or one of the following Advanced Practice Providers on your designated Care Team:   Murray Hodgkins, NP Christell Faith, PA-C Cadence Kathlen Mody, PA-C Gerrie Nordmann, NP   Important Information About Sugar

## 2022-04-11 NOTE — Progress Notes (Signed)
Cardiology Office Note:    Date:  04/11/2022   ID:  Jack Weaver, DOB [birth year], MRN [id number]  PCP:  Jack Weaver., MD  [facility] Cardiologist:  Nelva Bush, MD  [facility] Electrophysiologist:  None   Referring MD: Jack Weaver.,*   Chief Complaint: 71-monthfollow-up  History of Present Illness:    HLAMARCO GUDIELis a 66y.o. male with a hx of hypertension, prostate cancer s/p prostatectomy, arthritis who presents for HTN follow-up.    Seen in 2021 for palpitations and PVCs. Heart monitor showed NSR, average Hr 57 with brief episodes of PSVT, NSVT and rare PVCs/bigeminy. Echo showed LVEF 55-60%, mod LVH, G2DD. Metoprolol improved palpitations. BP was high but patient didn't want to start another medication.    The patient was seen 08/15/21 and BP was high, started on amlodipine. Metoprolol was switched to coreg.   The patient was last seen 10/04/2021 reported he stopped taking Coreg after 4-5 days due to lightheadedness. Lifestyle changes were encouraged.  Today, the BP is still a little high. The patient tried to take Coreg and felt dizzy and lightheaded. He says his BP may have been too low. He is taking amlodipine, lisinopril and Toprol. He is active and walks 3-5 miles daily.   Past Medical History:  Diagnosis Date   Arthritis    GERD (gastroesophageal reflux disease)    Hypertension    Prostate cancer ([facility]    Sleep apnea    no CPAP    Past Surgical History:  Procedure Laterality Date   APPENDECTOMY     At age 66  CATARACT EXTRACTION W/PHACO Left 08/08/2020   Procedure: CATARACT EXTRACTION PHACO AND INTRAOCULAR LENS PLACEMENT (IOC) LEFT 5.62 01:00.4 903%;  Surgeon: BLeandrew Koyanagi MD;  [place]  Service: Ophthalmology;  Laterality: Left;   HEMORROIDECTOMY  1930   PELVIC LYMPH NODE DISSECTION Bilateral 10/11/2018   Procedure: PELVIC LYMPH NODE DISSECTION;  Surgeon: BHollice Espy MD;  Location: [facility];   Service: Urology;  Laterality: Bilateral;   QUADRICEPS TENDON REPAIR Left 09/09/2017   Procedure: REPAIR QUADRICEP TENDON;  Surgeon: MEarnestine Leys MD;  Location: [facility];  Service: Orthopedics;  Laterality: Left;   ROBOT ASSISTED LAPAROSCOPIC RADICAL PROSTATECTOMY N/A 10/11/2018   Procedure: ROBOTIC ASSISTED LAPAROSCOPIC RADICAL PROSTATECTOMY;  Surgeon: BHollice Espy MD;  Location: [facility];  Service: Urology;  Laterality: N/A;    Current Medications: Current Meds  Medication Sig   amLODipine (NORVASC) 10 MG tablet Take 1 tablet (10 mg total) by mouth daily.   fluticasone (FLONASE) 50 MCG/ACT nasal spray Place 2 sprays into both nostrils daily.   gabapentin (NEURONTIN) 300 MG capsule gabapentin 300 mg capsule   lisinopril (ZESTRIL) 40 MG tablet TAKE 1 TABLET EVERY DAY   metoprolol succinate (TOPROL-XL) 25 MG 24 hr tablet Take 25 mg by mouth daily.   Multiple Vitamin (MULTIVITAMIN WITH MINERALS) TABS tablet Take 1 tablet by mouth daily.     Allergies:   Patient has no known allergies.   Social History   Socioeconomic History   Marital status: Married    Spouse name: Not on file   Number of children: Not on file   Years of education: Not on file   Highest education level: Not on file  Occupational History   Not on file  Tobacco Use   Smoking status: Some Days    Types: Cigars   Smokeless tobacco: Never  Vaping Use   Vaping Use: Never  used  Substance and Sexual Activity   Alcohol use: Yes    Alcohol/week: 2.0 standard drinks of alcohol    Types: 2 Cans of beer per week    Comment: occasionally   Drug use: No   Sexual activity: Not on file  Other Topics Concern   Not on file  Social History Narrative   Not on file   Social Determinants of Health   Financial Resource Strain: Low Risk  (09/16/2021)   Overall Financial Resource Strain (CARDIA)    Difficulty of Paying Living Expenses: Not hard at all  Food Insecurity: No Food Insecurity (09/16/2021)   Hunger Vital Sign     Worried About Running Out of Food in the Last Year: Never true    Ran Out of Food in the Last Year: Never true  Transportation Needs: No Transportation Needs (09/16/2021)   PRAPARE - Hydrologist (Medical): No    Lack of Transportation (Non-Medical): No  Physical Activity: Sufficiently Active (09/16/2021)   Exercise Vital Sign    Days of Exercise per Week: 6 days    Minutes of Exercise per Session: 60 min  Stress: No Stress Concern Present (09/16/2021)   [place]    Feeling of Stress : Not at all  Social Connections: Moderately Integrated (09/16/2021)   Social Connection and Isolation Panel [NHANES]    Frequency of Communication with Friends and Family: More than three times a week    Frequency of Social Gatherings with Friends and Family: Once a week    Attends Religious Services: 1 to 4 times per year    Active Member of [employer] or Organizations: No    Attends Music therapist: Never    Marital Status: Married     Family History: The patient's family history includes Asthma in his father; Cancer in his brother; Diabetes in his brother; Heart attack in his mother; Stroke in his father. There is no history of Prostate cancer, Kidney cancer, or Bladder Cancer.  ROS:   Please see the history of present illness.     All other systems reviewed and are negative.  EKGs/Labs/Other Studies Reviewed:    The following studies were reviewed today:  Echo 2021  1. Left ventricular ejection fraction, by estimation, is 55 to 60%. The  left ventricle has normal function. The left ventricle has no regional  wall motion abnormalities. There is moderate left ventricular hypertrophy.  Left ventricular diastolic  parameters are consistent with Grade II diastolic dysfunction  (pseudonormalization). The average left ventricular global longitudinal  strain is -16.3 %. The global longitudinal  strain is abnormal.   2. Right ventricular systolic function is normal. The right ventricular  size is mildly enlarged. There is normal pulmonary artery systolic  pressure.   3. Left atrial size was moderately dilated.   4. Right atrial size was mildly dilated.   5. The mitral valve is normal in structure. Mild to moderate mitral valve  regurgitation. No evidence of mitral stenosis.   6. Tricuspid valve regurgitation is mild to moderate.   7. The aortic valve is tricuspid. There is mild calcification of the  aortic valve. There is moderate thickening of the aortic valve. Aortic  valve regurgitation is not visualized. Mild to moderate aortic valve  sclerosis/calcification is present, without  any evidence of aortic stenosis.   8. The inferior vena cava is normal in size with <50% respiratory  variability, suggesting right atrial  pressure of 8 mmHg.   Heart monitor 2021 The patient was monitored for 6 days, 17 hours. The predominant rhythm was sinus with an average rate of 56 bpm (range 42-132 bpm in sinus). There were rare PACs and occasional PVCs (PVC burden 3.6%). 10 atrial runs lasting up to 16 beats with a maximum rate of 179 bpm were observed. A single 6 beat run of nonsustained ventricular tachycardia also occurred. No sustained arrhythmia or prolonged pause was identified. There were 117 patient triggered events during the monitoring period, corresponding to sinus rhythm, PACs, PVCs (including ventricular bigeminy), PSVT, and NSVT.   Predominately sinus rhythm with occasional PVCs and rare PACs.  Brief episodes of PSVT and NSVT also noted.  Numerous patient triggered events recorded, many of which correspond to ventricular bigeminy.  EKG:  EKG is  ordered today.  The ekg ordered today demonstrates NSR, 85bpm, TWI lateral leads  Recent Labs: 02/07/2022: ALT 18; BUN 17; Creatinine, Ser 1.19; Hemoglobin 11.7; Platelets 193; Potassium 4.2; Sodium 139; TSH 1.400  Recent Lipid Panel     Component Value Date/Time   CHOL 173 02/07/2022 0959   CHOL 178 06/22/2011 0025   TRIG 108 02/07/2022 0959   TRIG 109 06/22/2011 0025   HDL 52 02/07/2022 0959   HDL 46 06/22/2011 0025   CHOLHDL 3.3 02/07/2022 0959   VLDL 22 06/22/2011 0025   LDLCALC 101 (H) 02/07/2022 0959   LDLCALC 110 (H) 06/22/2011 0025      Physical Exam:    VS:  BP 138/82 (BP Location: Left Arm, Patient Position: Sitting, Cuff Size: Normal)   Pulse (!) 55   Ht '5\' 11"'$  (1.803 m)   Wt 233 lb 9.6 oz (106 kg)   SpO2 100%   BMI 32.58 kg/m     Wt Readings from Last 3 Encounters:  04/11/22 233 lb 9.6 oz (106 kg)  04/08/22 220 lb (99.8 kg)  03/01/22 220 lb (99.8 kg)     GEN:  Well nourished, well developed in no acute distress HEENT: Normal NECK: No JVD; No carotid bruits LYMPHATICS: No lymphadenopathy CARDIAC: RRR, no murmurs, rubs, gallops RESPIRATORY:  Clear to auscultation without rales, wheezing or rhonchi  ABDOMEN: Soft, non-tender, non-distended MUSCULOSKELETAL:  No edema; No deformity  SKIN: Warm and dry NEUROLOGIC:  Alert and oriented x 3 PSYCHIATRIC:  Normal affect   ASSESSMENT:    1. Essential hypertension   2. Lower leg edema    PLAN:    In order of problems listed above:  HTN BP mildly elevated. He tried Coreg but felt dizzy and lightheaded, so he stopped this. He is back taking Toprol '25mg'$  daily. Patient is active and eats relatively well. Continue amlodipine '10mg'$  daily, Toprol '25mg'$  daily and lisinopril.   LLE Patient denies lower leg edema. Prior echo showed normal LVEF. No significant edema on exam today. No further work-up.   Disposition: Follow up in 1 year(s) with MD/APP    Signed, Ausar Georgiou Ninfa Meeker, PA-C  04/11/2022 2:53 PM    [facility]

## 2022-04-14 ENCOUNTER — Telehealth: Payer: Self-pay | Admitting: Family Medicine

## 2022-04-14 NOTE — Telephone Encounter (Signed)
[facility] faxed refill request for the following medications:   metoprolol succinate (TOPROL-XL) 25 MG 24 hr tablet    Please advise.

## 2022-04-14 NOTE — Telephone Encounter (Signed)
This medication (Metoprolol succinate '25mg'$  daily) was recently refilled on 11/17 for 90-day supply with 1 refill.  Sent to [facility] in [facility], [place]

## 2022-04-23 MED ORDER — METOPROLOL SUCCINATE ER 25 MG PO TB24: 25.0000 mg | ORAL_TABLET | Freq: Every day | ORAL | 0 refills | Status: DC

## 2022-04-23 NOTE — Addendum Note (Signed)
Addended by: Doristine Devoid on: 04/23/2022 02:04 PM   Modules accepted: Orders

## 2022-04-23 NOTE — Telephone Encounter (Signed)
Prescription was sent to the wrong pharmacy. New prescription sent today to [facility]

## 2022-04-25 DIAGNOSIS — S76311A Strain of muscle, fascia and tendon of the posterior muscle group at thigh level, right thigh, initial encounter: Secondary | ICD-10-CM | POA: Diagnosis not present

## 2022-04-27 ENCOUNTER — Other Ambulatory Visit: Payer: Self-pay | Admitting: Family Medicine

## 2022-04-28 DIAGNOSIS — L538 Other specified erythematous conditions: Secondary | ICD-10-CM | POA: Diagnosis not present

## 2022-04-28 DIAGNOSIS — L720 Epidermal cyst: Secondary | ICD-10-CM | POA: Diagnosis not present

## 2022-04-28 DIAGNOSIS — L728 Other follicular cysts of the skin and subcutaneous tissue: Secondary | ICD-10-CM | POA: Diagnosis not present

## 2022-05-09 ENCOUNTER — Other Ambulatory Visit: Payer: Self-pay

## 2022-05-09 ENCOUNTER — Telehealth: Payer: Self-pay | Admitting: Family Medicine

## 2022-05-09 DIAGNOSIS — I10 Essential (primary) hypertension: Secondary | ICD-10-CM

## 2022-05-09 DIAGNOSIS — G5602 Carpal tunnel syndrome, left upper limb: Secondary | ICD-10-CM | POA: Diagnosis not present

## 2022-05-09 DIAGNOSIS — G5622 Lesion of ulnar nerve, left upper limb: Secondary | ICD-10-CM | POA: Diagnosis not present

## 2022-05-09 MED ORDER — LISINOPRIL 40 MG PO TABS: 40.0000 mg | ORAL_TABLET | Freq: Every day | ORAL | 1 refills | Status: DC

## 2022-05-09 NOTE — Telephone Encounter (Signed)
Patient has appointment scheduled with Dr. Quentin Cornwall 07/24/2022

## 2022-05-09 NOTE — Telephone Encounter (Signed)
[facility] faxed refill request for the following medications:   lisinopril (ZESTRIL) 40 MG tablet    Please advise

## 2022-05-14 ENCOUNTER — Other Ambulatory Visit: Payer: Self-pay | Admitting: Medical

## 2022-06-04 ENCOUNTER — Telehealth: Payer: Self-pay | Admitting: Family Medicine

## 2022-06-04 DIAGNOSIS — K2970 Gastritis, unspecified, without bleeding: Secondary | ICD-10-CM

## 2022-06-04 MED ORDER — OMEPRAZOLE 40 MG PO CPDR: DELAYED_RELEASE_CAPSULE | ORAL | 1 refills | Status: DC

## 2022-06-04 NOTE — Telephone Encounter (Signed)
[facility] pharmacy faxed refill request for the following medications:  omeprazole (PRILOSEC) 40 MG capsule   Please advise  

## 2022-06-19 DIAGNOSIS — G5623 Lesion of ulnar nerve, bilateral upper limbs: Secondary | ICD-10-CM | POA: Diagnosis not present

## 2022-06-19 DIAGNOSIS — G5622 Lesion of ulnar nerve, left upper limb: Secondary | ICD-10-CM | POA: Diagnosis not present

## 2022-06-24 DIAGNOSIS — G5623 Lesion of ulnar nerve, bilateral upper limbs: Secondary | ICD-10-CM | POA: Diagnosis not present

## 2022-07-01 DIAGNOSIS — G5623 Lesion of ulnar nerve, bilateral upper limbs: Secondary | ICD-10-CM | POA: Diagnosis not present

## 2022-07-11 DIAGNOSIS — G5622 Lesion of ulnar nerve, left upper limb: Secondary | ICD-10-CM | POA: Diagnosis not present

## 2022-07-11 DIAGNOSIS — G5623 Lesion of ulnar nerve, bilateral upper limbs: Secondary | ICD-10-CM | POA: Diagnosis not present

## 2022-07-11 DIAGNOSIS — G8918 Other acute postprocedural pain: Secondary | ICD-10-CM | POA: Diagnosis not present

## 2022-07-21 ENCOUNTER — Telehealth: Payer: Self-pay | Admitting: Family Medicine

## 2022-07-21 NOTE — Telephone Encounter (Signed)
Russell to schedule their annual wellness visit. Appointment made for 09/23/2022.  [place] Direct Dial: [phone]

## 2022-07-24 ENCOUNTER — Encounter: Payer: Self-pay | Admitting: Family Medicine

## 2022-07-24 ENCOUNTER — Ambulatory Visit (INDEPENDENT_AMBULATORY_CARE_PROVIDER_SITE_OTHER): Payer: Medicare HMO | Admitting: Family Medicine

## 2022-07-24 VITALS — BP 140/83 | HR 62 | Temp 98.1°F | Resp 16 | Ht 72.0 in | Wt 239.0 lb

## 2022-07-24 DIAGNOSIS — E661 Drug-induced obesity: Secondary | ICD-10-CM

## 2022-07-24 DIAGNOSIS — R635 Abnormal weight gain: Secondary | ICD-10-CM

## 2022-07-24 DIAGNOSIS — Z6832 Body mass index (BMI) 32.0-32.9, adult: Secondary | ICD-10-CM

## 2022-07-24 DIAGNOSIS — E66811 Obesity, class 1: Secondary | ICD-10-CM | POA: Insufficient documentation

## 2022-07-24 DIAGNOSIS — I10 Essential (primary) hypertension: Secondary | ICD-10-CM

## 2022-07-24 DIAGNOSIS — M25521 Pain in right elbow: Secondary | ICD-10-CM | POA: Diagnosis not present

## 2022-07-24 DIAGNOSIS — Z4889 Encounter for other specified surgical aftercare: Secondary | ICD-10-CM | POA: Diagnosis not present

## 2022-07-24 NOTE — Patient Instructions (Addendum)
Please restart the Meloxicam that you have at home daily for the next 3-4 weeks.  Please plan to see me in June for your follow up for blood pressure   We will follow up with results of labs once they are available.     Calorie Counting for Weight Loss Calories are units of energy. Your body needs a certain number of calories from food to keep going throughout the day. When you eat or drink more calories than your body needs, your body stores the extra calories mostly as fat. When you eat or drink fewer calories than your body needs, your body burns fat to get the energy it needs. Calorie counting means keeping track of how many calories you eat and drink each day. Calorie counting can be helpful if you need to lose weight. If you eat fewer calories than your body needs, you should lose weight. Ask your health care provider what a healthy weight is for you. For calorie counting to work, you will need to eat the right number of calories each day to lose a healthy amount of weight per week. A dietitian can help you figure out how many calories you need in a day and will suggest ways to reach your calorie goal. A healthy amount of weight to lose each week is usually 1-2 lb (0.5-0.9 kg). This usually means that your daily calorie intake should be reduced by 500-750 calories. Eating 1,200-1,500 calories a day can help most women lose weight. Eating 1,500-1,800 calories a day can help most men lose weight. What do I need to know about calorie counting? Work with your health care provider or dietitian to determine how many calories you should get each day. To meet your daily calorie goal, you will need to: Find out how many calories are in each food that you would like to eat. Try to do this before you eat. Decide how much of the food you plan to eat. Keep a food log. Do this by writing down what you ate and how many calories it had. To successfully lose weight, it is important to balance calorie  counting with a healthy lifestyle that includes regular activity. Where do I find calorie information?  The number of calories in a food can be found on a Nutrition Facts label. If a food does not have a Nutrition Facts label, try to look up the calories online or ask your dietitian for help. Remember that calories are listed per serving. If you choose to have more than one serving of a food, you will have to multiply the calories per serving by the number of servings you plan to eat. For example, the label on a package of bread might say that a serving size is 1 slice and that there are 90 calories in a serving. If you eat 1 slice, you will have eaten 90 calories. If you eat 2 slices, you will have eaten 180 calories. How do I keep a food log? After each time that you eat, record the following in your food log as soon as possible: What you ate. Be sure to include toppings, sauces, and other extras on the food. How much you ate. This can be measured in cups, ounces, or number of items. How many calories were in each food and drink. The total number of calories in the food you ate. Keep your food log near you, such as in a pocket-sized notebook or on an app or website on your mobile phone.  Some programs will calculate calories for you and show you how many calories you have left to meet your daily goal. What are some portion-control tips? Know how many calories are in a serving. This will help you know how many servings you can have of a certain food. Use a measuring cup to measure serving sizes. You could also try weighing out portions on a kitchen scale. With time, you will be able to estimate serving sizes for some foods. Take time to put servings of different foods on your favorite plates or in your favorite bowls and cups so you know what a serving looks like. Try not to eat straight from a food's packaging, such as from a bag or box. Eating straight from the package makes it hard to see how much  you are eating and can lead to overeating. Put the amount you would like to eat in a cup or on a plate to make sure you are eating the right portion. Use smaller plates, glasses, and bowls for smaller portions and to prevent overeating. Try not to multitask. For example, avoid watching TV or using your computer while eating. If it is time to eat, sit down at a table and enjoy your food. This will help you recognize when you are full. It will also help you be more mindful of what and how much you are eating. What are tips for following this plan? Reading food labels Check the calorie count compared with the serving size. The serving size may be smaller than what you are used to eating. Check the source of the calories. Try to choose foods that are high in protein, fiber, and vitamins, and low in saturated fat, trans fat, and sodium. Shopping Read nutrition labels while you shop. This will help you make healthy decisions about which foods to buy. Pay attention to nutrition labels for low-fat or fat-free foods. These foods sometimes have the same number of calories or more calories than the full-fat versions. They also often have added sugar, starch, or salt to make up for flavor that was removed with the fat. Make a grocery list of lower-calorie foods and stick to it. Cooking Try to cook your favorite foods in a healthier way. For example, try baking instead of frying. Use low-fat dairy products. Meal planning Use more fruits and vegetables. One-half of your plate should be fruits and vegetables. Include lean proteins, such as chicken, [place], and fish. Lifestyle Each week, aim to do one of the following: 150 minutes of moderate exercise, such as walking. 75 minutes of vigorous exercise, such as running. General information Know how many calories are in the foods you eat most often. This will help you calculate calorie counts faster. Find a way of tracking calories that works for you. Get  creative. Try different apps or programs if writing down calories does not work for you. What foods should I eat?  Eat nutritious foods. It is better to have a nutritious, high-calorie food, such as an avocado, than a food with few nutrients, such as a bag of potato chips. Use your calories on foods and drinks that will fill you up and will not leave you hungry soon after eating. Examples of foods that fill you up are nuts and nut butters, vegetables, lean proteins, and high-fiber foods such as whole grains. High-fiber foods are foods with more than 5 g of fiber per serving. Pay attention to calories in drinks. Low-calorie drinks include water and unsweetened drinks. The items  listed above may not be a complete list of foods and beverages you can eat. Contact a dietitian for more information. What foods should I limit? Limit foods or drinks that are not good sources of vitamins, minerals, or protein or that are high in unhealthy fats. These include: Candy. Other sweets. Sodas, specialty coffee drinks, alcohol, and juice. The items listed above may not be a complete list of foods and beverages you should avoid. Contact a dietitian for more information. How do I count calories when eating out? Pay attention to portions. Often, portions are much larger when eating out. Try these tips to keep portions smaller: Consider sharing a meal instead of getting your own. If you get your own meal, eat only half of it. Before you start eating, ask for a container and put half of your meal into it. When available, consider ordering smaller portions from the menu instead of full portions. Pay attention to your food and drink choices. Knowing the way food is cooked and what is included with the meal can help you eat fewer calories. If calories are listed on the menu, choose the lower-calorie options. Choose dishes that include vegetables, fruits, whole grains, low-fat dairy products, and lean proteins. Choose  items that are boiled, broiled, grilled, or steamed. Avoid items that are buttered, battered, fried, or served with cream sauce. Items labeled as crispy are usually fried, unless stated otherwise. Choose water, low-fat milk, unsweetened iced tea, or other drinks without added sugar. If you want an alcoholic beverage, choose a lower-calorie option, such as a glass of wine or light beer. Ask for dressings, sauces, and syrups on the side. These are usually high in calories, so you should limit the amount you eat. If you want a salad, choose a garden salad and ask for grilled meats. Avoid extra toppings such as bacon, cheese, or fried items. Ask for the dressing on the side, or ask for olive oil and vinegar or lemon to use as dressing. Estimate how many servings of a food you are given. Knowing serving sizes will help you be aware of how much food you are eating at restaurants. Where to find more information Centers for Disease Control and Prevention: http://www.wolf.info/ U.S. Department of Agriculture: http://www.wilson-mendoza.org/ Summary Calorie counting means keeping track of how many calories you eat and drink each day. If you eat fewer calories than your body needs, you should lose weight. A healthy amount of weight to lose per week is usually 1-2 lb (0.5-0.9 kg). This usually means reducing your daily calorie intake by 500-750 calories. The number of calories in a food can be found on a Nutrition Facts label. If a food does not have a Nutrition Facts label, try to look up the calories online or ask your dietitian for help. Use smaller plates, glasses, and bowls for smaller portions and to prevent overeating. Use your calories on foods and drinks that will fill you up and not leave you hungry shortly after a meal. This information is not intended to replace advice given to you by your health care provider. Make sure you discuss any questions you have with your health care provider. Document Revised: 06/23/2019 Document  Reviewed: 06/23/2019 Elsevier Patient Education  [place] refers to food and lifestyle choices that are based on the traditions of countries located on the [employer]. It focuses on eating more fruits, vegetables, whole grains, beans, nuts, seeds, and heart-healthy fats, and eating less dairy,  meat, eggs, and processed foods with added sugar, salt, and fat. This way of eating has been shown to help prevent certain conditions and improve outcomes for people who have chronic diseases, like kidney disease and heart disease. What are tips for following this plan? Reading food labels Check the serving size of packaged foods. For foods such as rice and pasta, the serving size refers to the amount of cooked product, not dry. Check the total fat in packaged foods. Avoid foods that have saturated fat or trans fats. Check the ingredient list for added sugars, such as corn syrup. Shopping  Buy a variety of foods that offer a balanced diet, including: Fresh fruits and vegetables (produce). Grains, beans, nuts, and seeds. Some of these may be available in unpackaged forms or large amounts (in bulk). Fresh seafood. Poultry and eggs. Low-fat dairy products. Buy whole ingredients instead of prepackaged foods. Buy fresh fruits and vegetables in-season from local farmers markets. Buy plain frozen fruits and vegetables. If you do not have access to quality fresh seafood, buy precooked frozen shrimp or canned fish, such as tuna, salmon, or sardines. Stock your pantry so you always have certain foods on hand, such as olive oil, canned tuna, canned tomatoes, rice, pasta, and beans. Cooking Cook foods with extra-virgin olive oil instead of using butter or other vegetable oils. Have meat as a side dish, and have vegetables or grains as your main dish. This means having meat in small portions or adding small amounts of meat to foods like pasta or  stew. Use beans or vegetables instead of meat in common dishes like chili or lasagna. Experiment with different cooking methods. Try roasting, broiling, steaming, and sauting vegetables. Add frozen vegetables to soups, stews, pasta, or rice. Add nuts or seeds for added healthy fats and plant protein at each meal. You can add these to yogurt, salads, or vegetable dishes. Marinate fish or vegetables using olive oil, lemon juice, garlic, and fresh herbs. Meal planning Plan to eat one vegetarian meal one day each week. Try to work up to two vegetarian meals, if possible. Eat seafood two or more times a week. Have healthy snacks readily available, such as: Vegetable sticks with hummus. Greek yogurt. Fruit and nut trail mix. Eat balanced meals throughout the week. This includes: Fruit: 2-3 servings a day. Vegetables: 4-5 servings a day. Low-fat dairy: 2 servings a day. Fish, poultry, or lean meat: 1 serving a day. Beans and legumes: 2 or more servings a week. Nuts and seeds: 1-2 servings a day. Whole grains: 6-8 servings a day. Extra-virgin olive oil: 3-4 servings a day. Limit red meat and sweets to only a few servings a month. Lifestyle  Cook and eat meals together with your family, when possible. Drink enough fluid to keep your urine pale yellow. Be physically active every day. This includes: Aerobic exercise like running or swimming. Leisure activities like gardening, walking, or housework. Get 7-8 hours of sleep each night. If recommended by your health care provider, drink red wine in moderation. This means 1 glass a day for nonpregnant women and 2 glasses a day for men. A glass of wine equals 5 oz (150 mL). What foods should I eat? Fruits Apples. Apricots. Avocado. Berries. Bananas. Cherries. Dates. Figs. Grapes. Lemons. Melon. Oranges. Peaches. Plums. Pomegranate. Vegetables Artichokes. Beets. Broccoli. Cabbage. Carrots. Eggplant. Green beans. Chard. Kale. Spinach. Onions.  Leeks. Peas. Squash. Tomatoes. Peppers. Radishes. Grains Whole-grain pasta. Brown rice. Bulgur wheat. Polenta. Couscous. Whole-wheat bread. [place]. Meats and  other proteins Beans. Almonds. Sunflower seeds. Pine nuts. Peanuts. [place]. Salmon. Scallops. Shrimp. [place]. Tilapia. Clams. Oysters. Eggs. Poultry without skin. Dairy Low-fat milk. Cheese. Greek yogurt. Fats and oils Extra-virgin olive oil. Avocado oil. Grapeseed oil. Beverages Water. Red wine. Herbal tea. Sweets and desserts Greek yogurt with honey. Baked apples. Poached pears. Trail mix. Seasonings and condiments Basil. Cilantro. Coriander. Cumin. Mint. Parsley. Sage. Rosemary. Tarragon. Garlic. Oregano. Thyme. Pepper. Balsamic vinegar. Tahini. Hummus. Tomato sauce. Olives. Mushrooms. The items listed above may not be a complete list of foods and beverages you can eat. Contact a dietitian for more information. What foods should I limit? This is a list of foods that should be eaten rarely or only on special occasions. Fruits Fruit canned in syrup. Vegetables Deep-fried potatoes (french fries). Grains Prepackaged pasta or rice dishes. Prepackaged cereal with added sugar. Prepackaged snacks with added sugar. Meats and other proteins Beef. Pork. Lamb. Poultry with skin. Hot dogs. [place] Salines. Dairy Ice cream. Sour cream. Whole milk. Fats and oils Butter. Canola oil. Vegetable oil. Beef fat (tallow). Lard. Beverages Juice. Sugar-sweetened soft drinks. Beer. Liquor and spirits. Sweets and desserts Cookies. Cakes. Pies. Candy. Seasonings and condiments Mayonnaise. Pre-made sauces and marinades. The items listed above may not be a complete list of foods and beverages you should limit. Contact a dietitian for more information. Summary The Mediterranean diet includes both food and lifestyle choices. Eat a variety of fresh fruits and vegetables, beans, nuts, seeds, and whole grains. Limit the amount of red meat and sweets that you  eat. If recommended by your health care provider, drink red wine in moderation. This means 1 glass a day for nonpregnant women and 2 glasses a day for men. A glass of wine equals 5 oz (150 mL). This information is not intended to replace advice given to you by your health care provider. Make sure you discuss any questions you have with your health care provider. Document Revised: 06/17/2019 Document Reviewed: 04/14/2019 Elsevier Patient Education  [place].

## 2022-07-24 NOTE — Progress Notes (Signed)
Established patient visit   Patient: Jack Weaver   DOB: May 13, 1956   67 y.o. Male  MRN: [id number] Visit Date: 07/24/2022  Today's healthcare provider: Eulis Foster, MD   No chief complaint on file.  Subjective    HPI  Hypertension, follow-up  BP Readings from Last 3 Encounters:  07/24/22 (!) 140/83  04/11/22 138/82  04/08/22 133/86   Wt Readings from Last 3 Encounters:  07/24/22 239 lb (108.4 kg)  04/11/22 233 lb 9.6 oz (106 kg)  04/08/22 220 lb (99.8 kg)     He was last seen for hypertension 6 months ago.  BP at that visit was 114/74. Management since that visit includes none. On Amlodipine 10 mg, Lisinopril 40 mg and Metoprolol 25 mg  He reports excellent compliance with treatment.  Outside blood pressures are 175/85.  Symptoms: No chest pain No chest pressure  No palpitations No syncope  No dyspnea No orthopnea  No paroxysmal nocturnal dyspnea No lower extremity edema   Pertinent labs Lab Results  Component Value Date   CHOL 173 02/07/2022   HDL 52 02/07/2022   LDLCALC 101 (H) 02/07/2022   TRIG 108 02/07/2022   CHOLHDL 3.3 02/07/2022   Lab Results  Component Value Date   NA 141 07/24/2022   K 4.0 07/24/2022   CREATININE 1.02 07/24/2022   EGFR 80 07/24/2022   GLUCOSE 113 (H) 07/24/2022   TSH 1.290 07/24/2022     The 10-year ASCVD risk score (Arnett DK, et al., 2019) is: 31.7%  ---------------------------------------------------------------------------------------------------   Elbow Pain: Patient complains of right elbow pain. Onset of the symptoms was a week ago. Inciting event: none known. Current symptoms include  sharp pain, not radiating . Pain is aggravated by: nothing in particular. Patient's overall course: stable. Patient has had no prior elbow problems. Evaluation to date: none. Treatment to date:  patient had xray done this morning through [facility] . Reports that he was told he has a bone spur and it should get better  with time.    Weight Gain  Patient reports that he has gain weight since he was hurt. He reports that he has not been able to work out like normal due to recent injuries. He denies family hx of thyroid cancers. He states that he weighs 227 on his home scale   Medications: Outpatient Medications Prior to Visit  Medication Sig   amLODipine (NORVASC) 10 MG tablet TAKE 1 TABLET EVERY DAY   fluticasone (FLONASE) 50 MCG/ACT nasal spray fluticasone propionate 50 mcg/actuation nasal spray,suspension  SPRAY 2 SPRAYS INTO EACH NOSTRIL EVERY DAY (Patient not taking: Reported on 04/11/2022)   fluticasone (FLONASE) 50 MCG/ACT nasal spray Place 2 sprays into both nostrils daily.   gabapentin (NEURONTIN) 300 MG capsule gabapentin 300 mg capsule   lisinopril (ZESTRIL) 40 MG tablet Take 1 tablet (40 mg total) by mouth daily.   methocarbamol (ROBAXIN) 750 MG tablet Take 750 mg by mouth 2 (two) times daily. (Patient not taking: Reported on 04/11/2022)   metoprolol succinate (TOPROL-XL) 25 MG 24 hr tablet Take 1 tablet (25 mg total) by mouth daily.   Multiple Vitamin (MULTIVITAMIN WITH MINERALS) TABS tablet Take 1 tablet by mouth daily.   omeprazole (PRILOSEC) 40 MG capsule TAKE 1 CAPSULE (40 MG TOTAL) BY MOUTH DAILY AS NEEDED (ACID REFLUX/ INDIGESTION).   [DISCONTINUED] meclizine (ANTIVERT) 12.5 MG tablet TAKE 1 TAB BY MOUTH 3 (THREE) TIMES DAILY AS NEEDED FOR DIZZINESS FOR UP TO 10 DAYS (Patient  not taking: Reported on 04/11/2022)   No facility-administered medications prior to visit.    Review of Systems     Objective    BP (!) 140/83 (Patient Position: Sitting)   Pulse 62   Temp 98.1 F (36.7 C) (Oral)   Resp 16   Ht 6' (1.829 m)   Wt 239 lb (108.4 kg)   BMI 32.41 kg/m    Physical Exam Vitals reviewed.  Constitutional:      General: He is not in acute distress.    Appearance: Normal appearance. He is not ill-appearing, toxic-appearing or diaphoretic.  Eyes:     Conjunctiva/sclera:  Conjunctivae normal.  Neck:     Thyroid: No thyroid mass, thyromegaly or thyroid tenderness.     Vascular: No carotid bruit.  Cardiovascular:     Rate and Rhythm: Normal rate and regular rhythm.     Pulses: Normal pulses.     Heart sounds: Normal heart sounds. No murmur heard.    No friction rub. No gallop.  Pulmonary:     Effort: Pulmonary effort is normal. No respiratory distress.     Breath sounds: Normal breath sounds. No stridor. No wheezing, rhonchi or rales.  Abdominal:     General: Bowel sounds are normal. There is no distension.     Palpations: Abdomen is soft.     Tenderness: There is no abdominal tenderness.  Musculoskeletal:     Right elbow: Decreased range of motion. Tenderness present in medial epicondyle, lateral epicondyle and olecranon process.     Left elbow: Tenderness present.     Right lower leg: No edema.     Left lower leg: No edema.     Comments: Left elbow has surgical dressings  Right elbow is tender on medial and lateral aspects Patient has normal ROM without erythema or overlying skin changes  Do not note any lacerations or swelling   Lymphadenopathy:     Cervical: No cervical adenopathy.  Skin:    Findings: No erythema or rash.  Neurological:     Mental Status: He is alert and oriented to person, place, and time.       Results for orders placed or performed in visit on 07/24/22  Hemoglobin A1c  Result Value Ref Range   Hgb A1c MFr Bld 6.4 (H) 4.8 - 5.6 %   Est. average glucose Bld gHb Est-mCnc 137 mg/dL  Comprehensive metabolic panel  Result Value Ref Range   Glucose 113 (H) 70 - 99 mg/dL   BUN 18 8 - 27 mg/dL   Creatinine, Ser 1.02 0.76 - 1.27 mg/dL   eGFR 80 >59 mL/min/1.73   BUN/Creatinine Ratio 18 10 - 24   Sodium 141 134 - 144 mmol/L   Potassium 4.0 3.5 - 5.2 mmol/L   Chloride 106 96 - 106 mmol/L   CO2 20 20 - 29 mmol/L   Calcium 9.5 8.6 - 10.2 mg/dL   Total Protein 6.4 6.0 - 8.5 g/dL   Albumin 4.3 3.9 - 4.9 g/dL   Globulin,  Total 2.1 1.5 - 4.5 g/dL   Albumin/Globulin Ratio 2.0 1.2 - 2.2   Bilirubin Total 0.2 0.0 - 1.2 mg/dL   Alkaline Phosphatase 142 (H) 44 - 121 IU/L   AST 23 0 - 40 IU/L   ALT 20 0 - 44 IU/L  TSH  Result Value Ref Range   TSH 1.290 0.450 - 4.500 uIU/mL    Assessment & Plan     Problem List Items Addressed This Visit  Cardiovascular and Mediastinum   Essential hypertension    Blood pressure elevated above goal in office today  Patient has elbow discomfort today and recently had surgery on left  Recommended checking home BP over next few weeks and following up for repeat check once he has healed more  BP is not severely elevated so will hold off on changing or adding medication at this time  He will continue current medications      Relevant Orders   Comprehensive metabolic panel (Completed)     Other   Weight gain - Primary    Acute, BMI 32.41 from 30.68 in Nov of 2023  Has gained 19 pounds since 2023  Recommended managing diet and increasing water intake, limiting alcohol and incorporating physical activity that does not exacerbate upper extremity pains        Relevant Orders   Hemoglobin A1c (Completed)   Comprehensive metabolic panel (Completed)   TSH (Completed)   Class 1 drug-induced obesity with serious comorbidity and body mass index (BMI) of 32.0 to 32.9 in adult    Discussed importance of dietary management and increased physical activity to obtain and manage healthy weight       Relevant Orders   Hemoglobin A1c (Completed)   Comprehensive metabolic panel (Completed)   TSH (Completed)    Return in about 3 months (around 10/22/2022) for HTN.       The entirety of the information documented in the History of Present Illness, Review of Systems and Physical Exam were personally obtained by me. Portions of this information were initially documented by Lyndel Pleasure, CMA . I, Eulis Foster, MD have reviewed the documentation above for thoroughness  and accuracy.      Eulis Foster, MD  [facility] [phone] (phone) [phone] (fax)  [place]

## 2022-07-25 LAB — COMPREHENSIVE METABOLIC PANEL
ALT: 20 IU/L (ref 0–44)
AST: 23 IU/L (ref 0–40)
Albumin/Globulin Ratio: 2 (ref 1.2–2.2)
Albumin: 4.3 g/dL (ref 3.9–4.9)
Alkaline Phosphatase: 142 IU/L — ABNORMAL HIGH (ref 44–121)
BUN/Creatinine Ratio: 18 (ref 10–24)
BUN: 18 mg/dL (ref 8–27)
Bilirubin Total: 0.2 mg/dL (ref 0.0–1.2)
CO2: 20 mmol/L (ref 20–29)
Calcium: 9.5 mg/dL (ref 8.6–10.2)
Chloride: 106 mmol/L (ref 96–106)
Creatinine, Ser: 1.02 mg/dL (ref 0.76–1.27)
Globulin, Total: 2.1 g/dL (ref 1.5–4.5)
Glucose: 113 mg/dL — ABNORMAL HIGH (ref 70–99)
Potassium: 4 mmol/L (ref 3.5–5.2)
Sodium: 141 mmol/L (ref 134–144)
Total Protein: 6.4 g/dL (ref 6.0–8.5)
eGFR: 80 mL/min/{1.73_m2} (ref >59)

## 2022-07-25 LAB — HEMOGLOBIN A1C
Est. average glucose Bld gHb Est-mCnc: 137 mg/dL
Hgb A1c MFr Bld: 6.4 % — ABNORMAL HIGH (ref 4.8–5.6)

## 2022-07-25 LAB — TSH: TSH: 1.29 u[IU]/mL (ref 0.450–4.500)

## 2022-07-25 NOTE — Assessment & Plan Note (Signed)
Acute, BMI 32.41 from 30.68 in Nov of 2023  Has gained 19 pounds since 2023  Recommended managing diet and increasing water intake, limiting alcohol and incorporating physical activity that does not exacerbate upper extremity pains

## 2022-07-25 NOTE — Assessment & Plan Note (Signed)
Discussed importance of dietary management and increased physical activity to obtain and manage healthy weight

## 2022-07-25 NOTE — Assessment & Plan Note (Signed)
Blood pressure elevated above goal in office today  Patient has elbow discomfort today and recently had surgery on left  Recommended checking home BP over next few weeks and following up for repeat check once he has healed more  BP is not severely elevated so will hold off on changing or adding medication at this time  He will continue current medications

## 2022-07-28 NOTE — Progress Notes (Deleted)
      Established patient visit   Patient: Jack Weaver   DOB: [birth year]   67 y.o. Male  MRN: [id number] Visit Date: 07/29/2022  Today's healthcare provider: Eulis Foster, MD   No chief complaint on file.  Subjective    HPI   Follow Up Abnormal Labs   Patient had abnormal labs completed on 07/24/22 that included an elevated alkaline phosphatase (142) and hemogloin A1c (6.4) in the pre-diabetes range Patient states ***   Medications: Outpatient Medications Prior to Visit  Medication Sig   amLODipine (NORVASC) 10 MG tablet TAKE 1 TABLET EVERY DAY   fluticasone (FLONASE) 50 MCG/ACT nasal spray fluticasone propionate 50 mcg/actuation nasal spray,suspension  SPRAY 2 SPRAYS INTO EACH NOSTRIL EVERY DAY (Patient not taking: Reported on 04/11/2022)   fluticasone (FLONASE) 50 MCG/ACT nasal spray Place 2 sprays into both nostrils daily.   gabapentin (NEURONTIN) 300 MG capsule gabapentin 300 mg capsule   lisinopril (ZESTRIL) 40 MG tablet Take 1 tablet (40 mg total) by mouth daily.   methocarbamol (ROBAXIN) 750 MG tablet Take 750 mg by mouth 2 (two) times daily. (Patient not taking: Reported on 04/11/2022)   metoprolol succinate (TOPROL-XL) 25 MG 24 hr tablet Take 1 tablet (25 mg total) by mouth daily.   Multiple Vitamin (MULTIVITAMIN WITH MINERALS) TABS tablet Take 1 tablet by mouth daily.   omeprazole (PRILOSEC) 40 MG capsule TAKE 1 CAPSULE (40 MG TOTAL) BY MOUTH DAILY AS NEEDED (ACID REFLUX/ INDIGESTION).   No facility-administered medications prior to visit.    Review of Systems  {Labs  Heme  Chem  Endocrine  Serology  Results Review (optional):23779}   Objective    There were no vitals taken for this visit. {Show previous vital signs (optional):23777}  Physical Exam  ***  No results found for any visits on 07/29/22.  Assessment & Plan     Problem List Items Addressed This Visit   None    No follow-ups on file.        The entirety of the  information documented in the History of Present Illness, Review of Systems and Physical Exam were personally obtained by me. Portions of this information were initially documented by Lyndel Pleasure, CMA . I, Eulis Foster, MD have reviewed the documentation above for thoroughness and accuracy.      Eulis Foster, MD  [facility] [phone] (phone) [phone] (fax)  [place]

## 2022-07-29 ENCOUNTER — Ambulatory Visit: Payer: Medicare HMO | Admitting: Family Medicine

## 2022-07-29 ENCOUNTER — Encounter: Payer: Self-pay | Admitting: Family Medicine

## 2022-07-29 ENCOUNTER — Telehealth (INDEPENDENT_AMBULATORY_CARE_PROVIDER_SITE_OTHER): Payer: Medicare HMO | Admitting: Family Medicine

## 2022-07-29 DIAGNOSIS — R748 Abnormal levels of other serum enzymes: Secondary | ICD-10-CM

## 2022-07-29 DIAGNOSIS — R7303 Prediabetes: Secondary | ICD-10-CM

## 2022-07-29 DIAGNOSIS — Z8639 Personal history of other endocrine, nutritional and metabolic disease: Secondary | ICD-10-CM

## 2022-07-29 NOTE — Progress Notes (Signed)
I,Joseline E Rosas,acting as a scribe for [employer], MD.,have documented all relevant documentation on the behalf of Eulis Foster, MD,as directed by  Eulis Foster, MD while in the presence of Eulis Foster, MD.  MyChart Video Visit    Virtual Visit via Video Note   This format is felt to be most appropriate for this patient at this time. Physical exam was limited by quality of the video and audio technology used for the visit.   Patient location:work address   Provider location:  [facility] [address] [place]  [place], [place] [address]   I discussed the limitations of evaluation and management by telemedicine and the availability of in person appointments. The patient expressed understanding and agreed to proceed.  Patient: Jack Weaver   DOB: 06/03/1955   67 y.o. Male  MRN: [id number] Visit Date: 07/29/2022  Today's healthcare provider: Eulis Foster, MD   Chief Complaint  Patient presents with   Discuss Labs   Subjective    HPI  Patient seeing provider to discuss labs. Patient states he has never had his A1c so elevated  States that he exercises at least three days per week  He states he has been eating oatmeal most days in the mornings but does have the brown sugar flavored oatmeal  He states that he avoids desserts and lots of sugary foods    Medications: Outpatient Medications Prior to Visit  Medication Sig   amLODipine (NORVASC) 10 MG tablet TAKE 1 TABLET EVERY DAY   fluticasone (FLONASE) 50 MCG/ACT nasal spray Place 2 sprays into both nostrils daily.   gabapentin (NEURONTIN) 300 MG capsule gabapentin 300 mg capsule   lisinopril (ZESTRIL) 40 MG tablet Take 1 tablet (40 mg total) by mouth daily.   metoprolol succinate (TOPROL-XL) 25 MG 24 hr tablet Take 1 tablet (25 mg total) by mouth daily.   Multiple Vitamin (MULTIVITAMIN WITH MINERALS) TABS tablet Take 1 tablet by mouth  daily.   omeprazole (PRILOSEC) 40 MG capsule TAKE 1 CAPSULE (40 MG TOTAL) BY MOUTH DAILY AS NEEDED (ACID REFLUX/ INDIGESTION).   fluticasone (FLONASE) 50 MCG/ACT nasal spray fluticasone propionate 50 mcg/actuation nasal spray,suspension  SPRAY 2 SPRAYS INTO EACH NOSTRIL EVERY DAY (Patient not taking: Reported on 04/11/2022)   methocarbamol (ROBAXIN) 750 MG tablet Take 750 mg by mouth 2 (two) times daily. (Patient not taking: Reported on 04/11/2022)   No facility-administered medications prior to visit.    Review of Systems     Objective    There were no vitals taken for this visit.     Physical Exam Constitutional:      General: He is not in acute distress.    Appearance: Normal appearance. He is not ill-appearing.     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 No respiratory distress.     Comments: Patient is speaking in full sentences without sounds of respiratory distress  He is breathing comfortably without supplemental oxygen  Neurological:     Mental Status: He is alert.        Assessment & Plan     Problem List Items Addressed This Visit       Other   Elevated alkaline phosphatase level - Primary    Alk phos was elevated at 142, previously 130  Recommended repeat alk phos, GGT,  and vitamin D levels  Patient counseled to continue MV that he takes daily  If vitamin D levels are low, we will have patient supplement weekly followed by daily PO supplementation  Will proceed with further evaluation of the liver if GGT is elevated        Relevant Orders   Vitamin D (25 hydroxy)   Comprehensive metabolic panel   Gamma GT   Pre-diabetes    A1c in pre-diabetes ranged  Counseled patient on importance of limiting sugary beverages, desserts, rice, pasta, breads and fried foods to improve glycemic control  Encouraged him to  continue regular physical activity  AVS provided with dietary recommendations to prevent diabetes         I discussed the assessment and treatment plan with the patient. The patient was provided an opportunity to ask questions and all were answered. The patient agreed with the plan and demonstrated an understanding of the instructions.   The patient was advised to call back or seek an in-person evaluation if the symptoms worsen or if the condition fails to improve as anticipated.  I provided 14 minutes of non-face-to-face time during this encounter.    Eulis Foster, MD [facility] [phone] (phone) [phone] (fax)  [place]

## 2022-07-29 NOTE — Assessment & Plan Note (Signed)
Alk phos was elevated at 142, previously 130  Recommended repeat alk phos, GGT, and vitamin D levels  Patient counseled to continue MV that he takes daily  If vitamin D levels are low, we will have patient supplement weekly followed by daily PO supplementation  Will proceed with further evaluation of the liver if GGT is elevated

## 2022-07-29 NOTE — Patient Instructions (Signed)
Preventing Type 2 Diabetes Mellitus Type 2 diabetes, also called type 2 diabetes mellitus, is a long-term (chronic) disease that affects sugar (glucose) levels in your blood. Normally, a hormone called insulin allows glucose to enter cells in your body. The cells use glucose for energy. With type 2 diabetes, you will have one or both of these problems: Your pancreas does not make enough insulin. Cells in your body do not respond properly to insulin that your body makes (insulin resistance). Insulin resistance or lack of insulin causes extra glucose to build up in the blood instead of going into cells. As a result, high blood glucose (hyperglycemia) develops. That can cause many complications. Being overweight or obese and having an inactive (sedentary) lifestyle can increase your risk for diabetes. Type 2 diabetes can be delayed or prevented by making certain nutrition and lifestyle changes. How can this condition affect me? If you do not take steps to prevent diabetes, your blood glucose levels may keep increasing over time. Too much glucose in your blood for a long time can damage your blood vessels, heart, kidneys, nerves, and eyes. Type 2 diabetes can lead to chronic health problems and complications, such as: Heart disease. Stroke. Blindness. Kidney disease. Depression. Poor circulation in your feet and legs. In severe cases, a foot or leg may need to be surgically removed (amputated). What can increase my risk? You may be more likely to develop type 2 diabetes if you: Have type 2 diabetes in your family. Are overweight or obese. Have a sedentary lifestyle. Have insulin resistance or a history of prediabetes. Have a history of pregnancy-related (gestational) diabetes or polycystic ovary syndrome (PCOS). What actions can I take to prevent this? It can be difficult to recognize signs of type 2 diabetes. Taking action to prevent the disease before you develop symptoms is the best way to avoid  possible damage to your body. Making certain nutrition and lifestyle changes may prevent or delay the disease and related health problems. Nutrition  Eat healthy meals and snacks regularly. Do not skip meals. Fruit or a handful of nuts is a healthy snack between meals. Drink water throughout the day. Avoid drinks that contain added sugar, such as soda or sweetened tea. Drink enough fluid to keep your urine pale yellow. Follow instructions from your health care provider about eating or drinking restrictions. Limit the amount of food you eat by: Managing how much you eat at a time (portion size). Checking food labels for the serving sizes of food. Using a kitchen scale to weigh amounts of food. Saut or steam food instead of frying it. Cook with water or broth instead of oils or butter. Limit saturated fat and salt (sodium) in your diet. Have no more than 1 tsp (2,400 mg) of sodium a day. If you have heart disease or high blood pressure, use less than ? tsp (1,500 mg) of sodium a day. Lifestyle  Lose weight if needed and as told. Your health care provider can determine how much weight loss is best for you and can help you lose weight safely. If you are overweight or obese, you may be told to lose at least 5?7% of your body weight. Manage blood pressure, cholesterol, and stress. Your health care provider will help determine the best treatment for you. Do not use any products that contain nicotine or tobacco. These products include cigarettes, chewing tobacco, and vaping devices, such as e-cigarettes. If you need help quitting, ask your health care provider. Activity  Do physical  activity that makes your heart beat faster and makes you sweat (moderate intensity). Do this for at least 30 minutes on at least 5 days of the week, or as much as told by your health care provider. Ask your health care provider what activities are safe for you. A mix of activities may be best, such as walking, swimming,  cycling, and strength training. Try to add physical activity into your day. For example: Park your car farther away than usual so that you walk more. Take a walk during your lunch break. Use stairs instead of elevators or escalators. Walk or bike to work instead of driving. Alcohol use If you drink alcohol: Limit how much you have to: 0?1 drink a day for women who are not pregnant. 0?2 drinks a day for men. Know how much alcohol is in your drink. In the [place] one drink equals one 12 oz bottle of beer (355 mL), one 5 oz glass of wine (148 mL), or one 1 oz glass of hard liquor (44 mL). General information Talk with your health care provider about your risk factors and how you can reduce your risk for diabetes. Have your blood glucose tested regularly, as told by your health care provider. Get screening tests as told by your health care provider. You may have these regularly, especially if you have certain risk factors for type 2 diabetes. Make an appointment with a registered dietitian. This diet and nutrition specialist can help you make a healthy eating plan and help you understand portion sizes and food labels. Where to find support Ask your health care provider to recommend a registered dietitian, a certified diabetes care and education specialist, or a weight loss program. Look for local or online weight loss groups. Join a gym, fitness club, or outdoor activity group, such as a walking club. Where to find more information For help and guidance and to learn more about diabetes and diabetes prevention, visit: American Diabetes Association (ADA): www.diabetes.[employer] of Diabetes and Digestive and Kidney Diseases: [employer] To learn more about healthy eating, visit: U.[place]. Department of Agriculture Scientist, research (physical sciences)): [employer] Office of Disease Prevention and Health Promotion (ODPHP): [place] Summary You can delay or prevent type 2 diabetes by eating healthy  foods, losing weight if needed, and increasing your physical activity. Talk with your health care provider about your risk factors for type 2 diabetes and how you can reduce your risk. It can be difficult to recognize the signs of type 2 diabetes. The best way to avoid possible damage to your body is to take action to prevent the disease before you develop symptoms. Get screening tests as told by your health care provider. This information is not intended to replace advice given to you by your health care provider. Make sure you discuss any questions you have with your health care provider. Document Revised: 08/06/2020 Document Reviewed: 08/06/2020 Elsevier Patient Education  [place].

## 2022-07-29 NOTE — Assessment & Plan Note (Signed)
A1c in pre-diabetes ranged  Counseled patient on importance of limiting sugary beverages, desserts, rice, pasta, breads and fried foods to improve glycemic control  Encouraged him to continue regular physical activity  AVS provided with dietary recommendations to prevent diabetes

## 2022-08-01 DIAGNOSIS — R748 Abnormal levels of other serum enzymes: Secondary | ICD-10-CM | POA: Diagnosis not present

## 2022-08-02 LAB — COMPREHENSIVE METABOLIC PANEL
ALT: 25 IU/L (ref 0–44)
AST: 24 IU/L (ref 0–40)
Albumin/Globulin Ratio: 1.9 (ref 1.2–2.2)
Albumin: 4.5 g/dL (ref 3.9–4.9)
Alkaline Phosphatase: 115 IU/L (ref 44–121)
BUN/Creatinine Ratio: 14 (ref 10–24)
BUN: 14 mg/dL (ref 8–27)
Bilirubin Total: 0.4 mg/dL (ref 0.0–1.2)
CO2: 22 mmol/L (ref 20–29)
Calcium: 9.5 mg/dL (ref 8.6–10.2)
Chloride: 107 mmol/L — ABNORMAL HIGH (ref 96–106)
Creatinine, Ser: 0.99 mg/dL (ref 0.76–1.27)
Globulin, Total: 2.4 g/dL (ref 1.5–4.5)
Glucose: 131 mg/dL — ABNORMAL HIGH (ref 70–99)
Potassium: 4.4 mmol/L (ref 3.5–5.2)
Sodium: 140 mmol/L (ref 134–144)
Total Protein: 6.9 g/dL (ref 6.0–8.5)
eGFR: 83 mL/min/{1.73_m2} (ref >59)

## 2022-08-02 LAB — GAMMA GT: GGT: 32 IU/L (ref 0–65)

## 2022-08-02 LAB — VITAMIN D 25 HYDROXY (VIT D DEFICIENCY, FRACTURES): Vit D, 25-Hydroxy: 35.9 ng/mL (ref 30.0–100.0)

## 2022-08-14 ENCOUNTER — Other Ambulatory Visit: Payer: Self-pay

## 2022-08-14 DIAGNOSIS — C61 Malignant neoplasm of prostate: Secondary | ICD-10-CM

## 2022-08-30 ENCOUNTER — Other Ambulatory Visit: Payer: Self-pay | Admitting: Family Medicine

## 2022-09-23 ENCOUNTER — Ambulatory Visit (INDEPENDENT_AMBULATORY_CARE_PROVIDER_SITE_OTHER): Payer: Medicare HMO

## 2022-09-23 VITALS — BP 124/68 | Ht 72.0 in | Wt 235.8 lb

## 2022-09-23 DIAGNOSIS — Z0000 Encounter for general adult medical examination without abnormal findings: Secondary | ICD-10-CM

## 2022-09-23 NOTE — Progress Notes (Signed)
Subjective:   Jack Weaver is a 67 y.o. male who presents for Medicare Annual/Subsequent preventive examination.  Review of Systems    Cardiac Risk Factors include: advanced age (>65men, >84 women);hypertension;male gender;obesity (BMI >30kg/m2)    Objective:    Today's Vitals   09/23/22 1433  Weight: 235 lb 12.8 oz (107 kg)  Height: 6' (1.829 m)  PainSc: 3    Body mass index is 31.98 kg/m.     09/23/2022    2:43 PM 12/14/2021    1:44 AM 09/16/2021    1:48 PM 07/23/2021    2:30 PM 07/01/2021    1:08 AM 08/24/2020    7:51 AM 08/24/2020    7:42 AM  Advanced Directives  Does Patient Have a Medical Advance Directive? Yes No Yes No No Yes   Type of Advance Directive   Living will   Healthcare Power of [employer] of [place];Living will  Does patient want to make changes to medical advance directive?   Yes (Inpatient - patient defers changing a medical advance directive and declines information at this time)   No - Patient declined   Copy of Healthcare Power of Attorney in Chart?      No - copy requested   Would patient like information on creating a medical advance directive?    No - Patient declined       Current Medications (verified) Outpatient Encounter Medications as of 09/23/2022  Medication Sig   amLODipine (NORVASC) 10 MG tablet TAKE 1 TABLET EVERY DAY   fluticasone (FLONASE) 50 MCG/ACT nasal spray    fluticasone (FLONASE) 50 MCG/ACT nasal spray Place 2 sprays into both nostrils daily.   gabapentin (NEURONTIN) 300 MG capsule gabapentin 300 mg capsule   lisinopril (ZESTRIL) 40 MG tablet Take 1 tablet (40 mg total) by mouth daily.   methocarbamol (ROBAXIN) 750 MG tablet Take 750 mg by mouth 2 (two) times daily.   metoprolol succinate (TOPROL-XL) 25 MG 24 hr tablet TAKE 1 TABLET EVERY DAY   Multiple Vitamin (MULTIVITAMIN WITH MINERALS) TABS tablet Take 1 tablet by mouth daily.   omeprazole (PRILOSEC) 40 MG capsule TAKE 1 CAPSULE (40 MG TOTAL) BY MOUTH DAILY AS  NEEDED (ACID REFLUX/ INDIGESTION).   No facility-administered encounter medications on file as of 09/23/2022.    Allergies (verified) Patient has no known allergies.   History: Past Medical History:  Diagnosis Date   Arthritis    GERD (gastroesophageal reflux disease)    Hypertension    Prostate cancer (HCC)    Sleep apnea    no CPAP   Past Surgical History:  Procedure Laterality Date   APPENDECTOMY     At age 9   CATARACT EXTRACTION W/PHACO Left 08/08/2020   Procedure: CATARACT EXTRACTION PHACO AND INTRAOCULAR LENS PLACEMENT (IOC) LEFT 5.62 01:00.4 903%;  Surgeon: Lockie Mola, MD;  Location: [facility];  Service: Ophthalmology;  Laterality: Left;   HEMORROIDECTOMY  1930   PELVIC LYMPH NODE DISSECTION Bilateral 10/11/2018   Procedure: PELVIC LYMPH NODE DISSECTION;  Surgeon: Vanna Scotland, MD;  Location: [facility];  Service: Urology;  Laterality: Bilateral;   QUADRICEPS TENDON REPAIR Left 09/09/2017   Procedure: REPAIR QUADRICEP TENDON;  Surgeon: Deeann Saint, MD;  Location: [facility];  Service: Orthopedics;  Laterality: Left;   ROBOT ASSISTED LAPAROSCOPIC RADICAL PROSTATECTOMY N/A 10/11/2018   Procedure: ROBOTIC ASSISTED LAPAROSCOPIC RADICAL PROSTATECTOMY;  Surgeon: Vanna Scotland, MD;  Location: [facility];  Service: Urology;  Laterality: N/A;   Family History  Problem  Relation Age of Onset   Heart attack Mother    Stroke Father    Asthma Father    Cancer Brother    Diabetes Brother    Prostate cancer Neg Hx    Kidney cancer Neg Hx    Bladder Cancer Neg Hx    Social History   Socioeconomic History   Marital status: Married    Spouse name: Not on file   Number of children: Not on file   Years of education: Not on file   Highest education level: Not on file  Occupational History   Not on file  Tobacco Use   Smoking status: Some Days    Types: Cigars   Smokeless tobacco: Never  Vaping Use   Vaping Use: Never used  Substance and Sexual Activity    Alcohol use: Yes    Alcohol/week: 2.0 standard drinks of alcohol    Types: 2 Cans of beer per week    Comment: occasionally   Drug use: No   Sexual activity: Not on file  Other Topics Concern   Not on file  Social History Narrative   Not on file   Social Determinants of Health   Financial Resource Strain: Low Risk  (09/23/2022)   Overall Financial Resource Strain (CARDIA)    Difficulty of Paying Living Expenses: Not hard at all  Food Insecurity: No Food Insecurity (09/23/2022)   Hunger Vital Sign    Worried About Running Out of Food in the Last Year: Never true    Ran Out of Food in the Last Year: Never true  Transportation Needs: No Transportation Needs (09/23/2022)   PRAPARE - Administrator, Civil Service (Medical): No    Lack of Transportation (Non-Medical): No  Physical Activity: Sufficiently Active (09/23/2022)   Exercise Vital Sign    Days of Exercise per Week: 6 days    Minutes of Exercise per Session: 70 min  Stress: No Stress Concern Present (09/23/2022)   [employer] of Occupational Health - Occupational Stress Questionnaire    Feeling of Stress : Not at all  Social Connections: Moderately Integrated (09/23/2022)   Social Connection and Isolation Panel [NHANES]    Frequency of Communication with Friends and Family: More than three times a week    Frequency of Social Gatherings with Friends and Family: Twice a week    Attends Religious Services: 1 to 4 times per year    Active Member of [employer] or Organizations: No    Attends Engineer, structural: Never    Marital Status: Married    Tobacco Counseling Ready to quit: Not Answered Counseling given: Not Answered   Clinical Intake:  Pre-visit preparation completed: Yes  Pain : 0-10 Pain Score: 3  Pain Location: Neck Pain Orientation: Right Pain Descriptors / Indicators: Aching Pain Onset: More than a month ago Pain Frequency: Intermittent Pain Relieving Factors: IBU  Pain Relieving  Factors: IBU  BMI - recorded: 31.98 Nutritional Status: BMI > 30  Obese Nutritional Risks: None Diabetes: No  How often do you need to have someone help you when you read instructions, pamphlets, or other written materials from your doctor or pharmacy?: 1 - Never  Diabetic?no  Interpreter Needed?: No  Comments: lives with wife Information entered by :: B.Jackline Castilla,LPN   Activities of Daily Living    09/23/2022    2:44 PM 07/24/2022    2:05 PM  In your present state of health, do you have any difficulty performing the following activities:  Hearing? 0 0  Vision? 1 0  Difficulty concentrating or making decisions? 0 0  Walking or climbing stairs? 0 0  Dressing or bathing? 0 0  Doing errands, shopping? 0 0  Preparing Food and eating ? N   Using the Toilet? N   In the past six months, have you accidently leaked urine? N   Do you have problems with loss of bowel control? N   Managing your Medications? N   Managing your Finances? N   Housekeeping or managing your Housekeeping? N     Patient Care Team: Ronnald Ramp, MD as PCP - General (Family Medicine) End, Cristal Deer, MD as PCP - Cardiology (Cardiology)  Indicate any recent Medical Services you may have received from other than [facility] providers in the past year (date may be approximate).     Assessment:   This is a routine wellness examination for [place].  Hearing/Vision screen Hearing Screening - Comments:: Adequate hearing:buzzing in rt ear all the time;worsens when does not sleep Vision Screening - Comments:: Adequate vision; rt eye cloudy [facility]  Dietary issues and exercise activities discussed: Current Exercise Habits: Home exercise routine;Structured exercise class, Type of exercise: stretching;treadmill;walking;strength training/weights, Time (Minutes): > 60, Frequency (Times/Week): 6, Weekly Exercise (Minutes/Week): 0, Intensity: Mild, Exercise limited by: orthopedic condition(s)   Goals  Addressed             This Visit's Progress    DIET - EAT MORE FRUITS AND VEGETABLES   On track      Depression Screen    09/23/2022    2:40 PM 07/24/2022    2:05 PM 02/06/2022    2:16 PM 09/16/2021    1:47 PM 06/27/2021    3:39 PM 12/31/2020    9:08 AM 03/25/2018    1:28 PM  PHQ 2/9 Scores  PHQ - 2 Score 0 0 0 0 0 0 0  PHQ- 9 Score   0  0 0     Fall Risk    09/23/2022    2:37 PM 07/24/2022    2:05 PM 02/06/2022    2:16 PM 09/16/2021    1:51 PM 06/27/2021    3:39 PM  Fall Risk   Falls in the past year? 0 0 0 1 0  Number falls in past yr: 0 0 0 0 0  Injury with Fall? 0 0 0 1 1  Risk for fall due to : No Fall Risks No Fall Risks No Fall Risks History of fall(s)   Follow up Falls prevention discussed;Education provided  Falls evaluation completed Falls prevention discussed     FALL RISK PREVENTION PERTAINING TO THE HOME:  Any stairs in or around the home? Yes  If so, are there any without handrails? No  Home free of loose throw rugs in walkways, pet beds, electrical cords, etc? Yes  Adequate lighting in your home to reduce risk of falls? Yes   ASSISTIVE DEVICES UTILIZED TO PREVENT FALLS:  Life alert? No  Use of a cane, walker or w/c? No  Grab bars in the bathroom? Yes  Shower chair or bench in shower? No  Elevated toilet seat or a handicapped toilet? No   TIMED UP AND GO:  Was the test performed? Yes .  Length of time to ambulate 10 feet: 10 sec.   Gait steady and fast without use of assistive device  Cognitive Function:    09/16/2021    2:04 PM  MMSE - Mini Mental State Exam  Not completed: Refused  09/23/2022    2:51 PM  6CIT Screen  What Year? 0 points  What month? 0 points  What time? 0 points  Count back from 20 0 points  Months in reverse 0 points  Repeat phrase 4 points  Total Score 4 points    Immunizations Immunization History  Administered Date(s) Administered   Fluad Quad(high Dose 65+) 02/06/2022   Influenza, High Dose Seasonal PF  02/10/2019   Influenza-Unspecified 02/24/2015, 03/11/2016, 02/27/2018   PFIZER Comirnaty(Gray Top)Covid-19 Tri-Sucrose Vaccine 09/27/2020   PFIZER(Purple Top)SARS-COV-2 Vaccination 07/07/2019, 08/03/2019, 03/11/2020   Pneumococcal Polysaccharide-23 11/24/2018   Td 05/26/1989, 05/01/1998, 12/31/2020   Tdap 12/24/2009    TDAP status: Up to date  Flu Vaccine status: Up to date  Pneumococcal vaccine status: Up to date  Covid-19 vaccine status: Completed vaccines  Qualifies for Shingles Vaccine? Yes   Zostavax completed No   Shingrix Completed?: No.    Education has been provided regarding the importance of this vaccine. Patient has been advised to call insurance company to determine out of pocket expense if they have not yet received this vaccine. Advised may also receive vaccine at local pharmacy or Health Dept. Verbalized acceptance and understanding.  Screening Tests Health Maintenance  Topic Date Due   Zoster Vaccines- Shingrix (1 of 2) Never done   COLONOSCOPY (Pts 45-63yrs Insurance coverage will need to be confirmed)  06/27/2014   Pneumonia Vaccine 57+ Years old (2 of 2 - PCV) 11/24/2019   COVID-19 Vaccine (5 - 2023-24 season) 01/24/2022   INFLUENZA VACCINE  12/25/2022   Medicare Annual Wellness (AWV)  09/23/2023   DTaP/Tdap/Td (5 - Td or Tdap) 01/01/2031   Hepatitis C Screening  Completed   HPV VACCINES  Aged Out    Health Maintenance  Health Maintenance Due  Topic Date Due   Zoster Vaccines- Shingrix (1 of 2) Never done   COLONOSCOPY (Pts 45-87yrs Insurance coverage will need to be confirmed)  06/27/2014   Pneumonia Vaccine 52+ Years old (2 of 2 - PCV) 11/24/2019   COVID-19 Vaccine (5 - 2023-24 season) 01/24/2022    Colorectal cancer screening: Type of screening: Cologuard. Completed yes. Repeat every 3 years  Lung Cancer Screening: (Low Dose CT Chest recommended if Age 39-80 years, 30 pack-year currently smoking OR have quit w/in 15years.) does not qualify.    Lung Cancer Screening Referral: no  Additional Screening:  Hepatitis C Screening: does not qualify; Completed yes  Vision Screening: Recommended annual ophthalmology exams for early detection of glaucoma and other disorders of the eye. Is the patient up to date with their annual eye exam?  No  Who is the provider or what is the name of the office in which the patient attends annual eye exams?  [facility]-will schedule If pt is not established with a provider, would they like to be referred to a provider to establish care? No .   Dental Screening: Recommended annual dental exams for proper oral hygiene  Community Resource Referral / Chronic Care Management: CRR required this visit?  No   CCM required this visit?  No    Plan:     I have personally reviewed and noted the following in the patient's chart:   Medical and social history Use of alcohol, tobacco or illicit drugs  Current medications and supplements including opioid prescriptions. Patient is not currently taking opioid prescriptions. Functional ability and status Nutritional status Physical activity Advanced directives List of other physicians Hospitalizations, surgeries, and ER visits in previous 12 months Vitals Screenings  to include cognitive, depression, and falls Referrals and appointments  In addition, I have reviewed and discussed with patient certain preventive protocols, quality metrics, and best practice recommendations. A written personalized care plan for preventive services as well as general preventive health recommendations were provided to patient.     Sue Lush, LPN   [id number]   Nurse Notes: The patient states he is doing well and has no concerns or questions at this time.

## 2022-09-23 NOTE — Patient Instructions (Signed)
Jack Weaver , Thank you for taking time to come for your Medicare Wellness Visit. I appreciate your ongoing commitment to your health goals. Please review the following plan we discussed and let me know if I can assist you in the future.   These are the goals we discussed:  Goals      DIET - EAT MORE FRUITS AND VEGETABLES        This is a list of the screening recommended for you and due dates:  Health Maintenance  Topic Date Due   Zoster (Shingles) Vaccine (1 of 2) Never done   Colon Cancer Screening  06/27/2014   Pneumonia Vaccine (2 of 2 - PCV) 11/24/2019   COVID-19 Vaccine (5 - 2023-24 season) 01/24/2022   Flu Shot  12/25/2022   Medicare Annual Wellness Visit  09/23/2023   DTaP/Tdap/Td vaccine (5 - Td or Tdap) 01/01/2031   Hepatitis C Screening: USPSTF Recommendation to screen - Ages 18-79 yo.  Completed   HPV Vaccine  Aged Out    Advanced directives: yes  Conditions/risks identified: none  Next appointment: Follow up in one year for your annual wellness visit. 09/28/2023 @ 2pm in person  Preventive Care 65 Years and Older, Male  Preventive care refers to lifestyle choices and visits with your health care provider that can promote health and wellness. What does preventive care include? A yearly physical exam. This is also called an annual well check. Dental exams once or twice a year. Routine eye exams. Ask your health care provider how often you should have your eyes checked. Personal lifestyle choices, including: Daily care of your teeth and gums. Regular physical activity. Eating a healthy diet. Avoiding tobacco and drug use. Limiting alcohol use. Practicing safe sex. Taking low doses of aspirin every day. Taking vitamin and mineral supplements as recommended by your health care provider. What happens during an annual well check? The services and screenings done by your health care provider during your annual well check will depend on your age, overall health,  lifestyle risk factors, and family history of disease. Counseling  Your health care provider may ask you questions about your: Alcohol use. Tobacco use. Drug use. Emotional well-being. Home and relationship well-being. Sexual activity. Eating habits. History of falls. Memory and ability to understand (cognition). Work and work Astronomer. Screening  You may have the following tests or measurements: Height, weight, and BMI. Blood pressure. Lipid and cholesterol levels. These may be checked every 5 years, or more frequently if you are over 58 years old. Skin check. Lung cancer screening. You may have this screening every year starting at age 55 if you have a 30-pack-year history of smoking and currently smoke or have quit within the past 15 years. Fecal occult blood test (FOBT) of the stool. You may have this test every year starting at age 47. Flexible sigmoidoscopy or colonoscopy. You may have a sigmoidoscopy every 5 years or a colonoscopy every 10 years starting at age 75. Prostate cancer screening. Recommendations will vary depending on your family history and other risks. Hepatitis C blood test. Hepatitis B blood test. Sexually transmitted disease (STD) testing. Diabetes screening. This is done by checking your blood sugar (glucose) after you have not eaten for a while (fasting). You may have this done every 1-3 years. Abdominal aortic aneurysm (AAA) screening. You may need this if you are a current or former smoker. Osteoporosis. You may be screened starting at age 61 if you are at high risk. Talk with your  health care provider about your test results, treatment options, and if necessary, the need for more tests. Vaccines  Your health care provider may recommend certain vaccines, such as: Influenza vaccine. This is recommended every year. Tetanus, diphtheria, and acellular pertussis (Tdap, Td) vaccine. You may need a Td booster every 10 years. Zoster vaccine. You may need this  after age 43. Pneumococcal 13-valent conjugate (PCV13) vaccine. One dose is recommended after age 32. Pneumococcal polysaccharide (PPSV23) vaccine. One dose is recommended after age 13. Talk to your health care provider about which screenings and vaccines you need and how often you need them. This information is not intended to replace advice given to you by your health care provider. Make sure you discuss any questions you have with your health care provider. Document Released: 06/08/2015 Document Revised: 01/30/2016 Document Reviewed: 03/13/2015 Elsevier Interactive Patient Education  2017 [place] Prevention in the Home Falls can cause injuries. They can happen to people of all ages. There are many things you can do to make your home safe and to help prevent falls. What can I do on the outside of my home? Regularly fix the edges of walkways and driveways and fix any cracks. Remove anything that might make you trip as you walk through a door, such as a raised step or threshold. Trim any bushes or trees on the path to your home. Use bright outdoor lighting. Clear any walking paths of anything that might make someone trip, such as rocks or tools. Regularly check to see if handrails are loose or broken. Make sure that both sides of any steps have handrails. Any raised decks and porches should have guardrails on the edges. Have any leaves, snow, or ice cleared regularly. Use sand or salt on walking paths during winter. Clean up any spills in your garage right away. This includes oil or grease spills. What can I do in the bathroom? Use night lights. Install grab bars by the toilet and in the tub and shower. Do not use towel bars as grab bars. Use non-skid mats or decals in the tub or shower. If you need to sit down in the shower, use a plastic, non-slip stool. Keep the floor dry. Clean up any water that spills on the floor as soon as it happens. Remove soap buildup in the tub or  shower regularly. Attach bath mats securely with double-sided non-slip rug tape. Do not have throw rugs and other things on the floor that can make you trip. What can I do in the bedroom? Use night lights. Make sure that you have a light by your bed that is easy to reach. Do not use any sheets or blankets that are too big for your bed. They should not hang down onto the floor. Have a firm chair that has side arms. You can use this for support while you get dressed. Do not have throw rugs and other things on the floor that can make you trip. What can I do in the kitchen? Clean up any spills right away. Avoid walking on wet floors. Keep items that you use a lot in easy-to-reach places. If you need to reach something above you, use a strong step stool that has a grab bar. Keep electrical cords out of the way. Do not use floor polish or wax that makes floors slippery. If you must use wax, use non-skid floor wax. Do not have throw rugs and other things on the floor that can make you trip. What  can I do with my stairs? Do not leave any items on the stairs. Make sure that there are handrails on both sides of the stairs and use them. Fix handrails that are broken or loose. Make sure that handrails are as long as the stairways. Check any carpeting to make sure that it is firmly attached to the stairs. Fix any carpet that is loose or worn. Avoid having throw rugs at the top or bottom of the stairs. If you do have throw rugs, attach them to the floor with carpet tape. Make sure that you have a light switch at the top of the stairs and the bottom of the stairs. If you do not have them, ask someone to add them for you. What else can I do to help prevent falls? Wear shoes that: Do not have high heels. Have rubber bottoms. Are comfortable and fit you well. Are closed at the toe. Do not wear sandals. If you use a stepladder: Make sure that it is fully opened. Do not climb a closed stepladder. Make  sure that both sides of the stepladder are locked into place. Ask someone to hold it for you, if possible. Clearly mark and make sure that you can see: Any grab bars or handrails. First and last steps. Where the edge of each step is. Use tools that help you move around (mobility aids) if they are needed. These include: Canes. Walkers. Scooters. Crutches. Turn on the lights when you go into a dark area. Replace any light bulbs as soon as they burn out. Set up your furniture so you have a clear path. Avoid moving your furniture around. If any of your floors are uneven, fix them. If there are any pets around you, be aware of where they are. Review your medicines with your doctor. Some medicines can make you feel dizzy. This can increase your chance of falling. Ask your doctor what other things that you can do to help prevent falls. This information is not intended to replace advice given to you by your health care provider. Make sure you discuss any questions you have with your health care provider. Document Released: 03/08/2009 Document Revised: 10/18/2015 Document Reviewed: 06/16/2014 Elsevier Interactive Patient Education  2017 [employer].

## 2022-09-24 ENCOUNTER — Other Ambulatory Visit: Payer: Medicare HMO

## 2022-09-24 DIAGNOSIS — C61 Malignant neoplasm of prostate: Secondary | ICD-10-CM | POA: Diagnosis not present

## 2022-09-25 ENCOUNTER — Encounter: Payer: Self-pay | Admitting: Family Medicine

## 2022-09-25 DIAGNOSIS — Z8639 Personal history of other endocrine, nutritional and metabolic disease: Secondary | ICD-10-CM | POA: Insufficient documentation

## 2022-09-25 LAB — PSA: Prostate Specific Ag, Serum: <0.1 ng/mL (ref 0.0–4.0)

## 2022-10-01 ENCOUNTER — Ambulatory Visit: Payer: Medicare HMO | Admitting: Urology

## 2022-10-01 VITALS — BP 166/77 | HR 56 | Ht 72.0 in | Wt 234.0 lb

## 2022-10-01 DIAGNOSIS — C61 Malignant neoplasm of prostate: Secondary | ICD-10-CM | POA: Diagnosis not present

## 2022-10-01 DIAGNOSIS — C775 Secondary and unspecified malignant neoplasm of intrapelvic lymph nodes: Secondary | ICD-10-CM

## 2022-10-01 MED ORDER — LEUPROLIDE ACETATE (6 MONTH) 45 MG ~~LOC~~ KIT
45.0000 mg | PACK | Freq: Once | SUBCUTANEOUS | Status: AC
  Administered 2022-10-01: 45 mg via SUBCUTANEOUS

## 2022-10-01 NOTE — Progress Notes (Signed)
Eligard SubQ Injection   Due to Prostate Cancer patient is present today for a Eligard Injection.  Medication: Eligard 6 month Dose: 45 mg  Location: right RUOQ Lot: [id number] Exp: 01/2024  Patient tolerated well, no complications were noted  Performed by: Jearld Pies RMA  Per Dr. Apolinar Junes patient is to continue therapy for indefinitely . Patient's next follow up was scheduled for 03/24/2023. This appointment was scheduled using wheel and given to patient today along with reminder continue on Vitamin D 800-1000iu and Calcium 1000-1200mg  daily while on Androgen Deprivation Therapy.  PA approval dates: NO PA needed

## 2022-10-01 NOTE — Progress Notes (Signed)
Jack Weaver,acting as a scribe for Vanna Scotland, MD.,have documented all relevant documentation on the behalf of Vanna Scotland, MD,as directed by  Vanna Scotland, MD while in the presence of Vanna Scotland, MD.  10/01/2022 8:43 AM   Jonelle Sports 12-Sep-1955 [id number]  Referring provider: Ronnald Ramp, MD [address] [place],  [place] [address]  Chief Complaint  Patient presents with   Prostate Cancer    Follow up    HPI: 67 year-old male  with a personal history of prostate cancer who returns today for a 6 month follow up.   He underwent radical prostatectomy non-nerve sparing with bilateral pelvic lymph node dissection on 10/01/2018.  Surgical pathology was consistent with Gleason 3+5 disease, pT3a N1.  He was noted to have extraprostatic extension with positive posterior margins both left and right.  He is also noted to have 2 of 3 lymph nodes on the left with focal metastatic disease.  0 of 3 lymph nodes on the right.  No bladder neck involvement.  No seminal vesicle involvement.   He underwent staging axiom PET scan on 11/08/2018 that was negative for metastatic disease and was started on Lupron. He declined whole pelvic radiation.    Testosterone indicated he did not achieve castrate levels in 2021 and a second dose of ADT was administered.  His PSA had risen to 0.7 In 2021.   He did not tolerate apalutamide as additional adjuvant treatment to his ADT.    His most recent PSA on 09/24/2022 is undetectable.  He is on ADT and is due for Eligard today.   Today, he reports no new symptoms or concerns. He does experience continued leakage, and hormonal changes affecting sexual function, but indicates that this is unchanged from previous discussions. He notes a decrease in hot flashes, but is aware that today's Eligard injection will cause these to return.     PMH: Past Medical History:  Diagnosis Date   Arthritis    GERD (gastroesophageal  reflux disease)    Hypertension    Prostate cancer (HCC)    Sleep apnea    no CPAP    Surgical History: Past Surgical History:  Procedure Laterality Date   APPENDECTOMY     At age 32   CATARACT EXTRACTION W/PHACO Left 08/08/2020   Procedure: CATARACT EXTRACTION PHACO AND INTRAOCULAR LENS PLACEMENT (IOC) LEFT 5.62 01:00.4 903%;  Surgeon: Lockie Mola, MD;  Location: [facility];  Service: Ophthalmology;  Laterality: Left;   HEMORROIDECTOMY  1930   PELVIC LYMPH NODE DISSECTION Bilateral 10/11/2018   Procedure: PELVIC LYMPH NODE DISSECTION;  Surgeon: Vanna Scotland, MD;  Location: [facility];  Service: Urology;  Laterality: Bilateral;   QUADRICEPS TENDON REPAIR Left 09/09/2017   Procedure: REPAIR QUADRICEP TENDON;  Surgeon: Deeann Saint, MD;  Location: [facility];  Service: Orthopedics;  Laterality: Left;   ROBOT ASSISTED LAPAROSCOPIC RADICAL PROSTATECTOMY N/A 10/11/2018   Procedure: ROBOTIC ASSISTED LAPAROSCOPIC RADICAL PROSTATECTOMY;  Surgeon: Vanna Scotland, MD;  Location: [facility];  Service: Urology;  Laterality: N/A;    Home Medications:  Allergies as of 10/01/2022   No Known Allergies      Medication List        Accurate as of Oct 01, 2022  8:43 AM. If you have any questions, ask your nurse or doctor.          amLODipine 10 MG tablet Commonly known as: NORVASC TAKE 1 TABLET EVERY DAY   fluticasone 50 MCG/ACT nasal spray Commonly known  as: FLONASE   gabapentin 300 MG capsule Commonly known as: NEURONTIN gabapentin 300 mg capsule   lisinopril 40 MG tablet Commonly known as: ZESTRIL Take 1 tablet (40 mg total) by mouth daily.   methocarbamol 750 MG tablet Commonly known as: ROBAXIN Take 750 mg by mouth 2 (two) times daily.   metoprolol succinate 25 MG 24 hr tablet Commonly known as: TOPROL-XL TAKE 1 TABLET EVERY DAY   multivitamin with minerals Tabs tablet Take 1 tablet by mouth daily.   omeprazole 40 MG capsule Commonly known as:  PRILOSEC TAKE 1 CAPSULE (40 MG TOTAL) BY MOUTH DAILY AS NEEDED (ACID REFLUX/ INDIGESTION).        Family History: Family History  Problem Relation Age of Onset   Heart attack Mother    Stroke Father    Asthma Father    Cancer Brother    Diabetes Brother    Prostate cancer Neg Hx    Kidney cancer Neg Hx    Bladder Cancer Neg Hx     Social History:  reports that he has been smoking cigars. He has never used smokeless tobacco. He reports current alcohol use of about 2.0 standard drinks of alcohol per week. He reports that he does not use drugs.   Physical Exam: BP (!) 155/88   Pulse (!) 55   Ht 6' (1.829 m)   Wt 234 lb (106.1 kg)   BMI 31.74 kg/m   Constitutional:  Alert and oriented, No acute distress. HEENT: [place] AT, moist mucus membranes.  Trachea midline, no masses. Neurologic: Grossly intact, no focal deficits, moving all 4 extremities. Psychiatric: Normal mood and affect.   Assessment & Plan:    1. Prostate cancer (HCC) - Complex, currently castrate sensitive metastatic to the pelvic lymph nodes  - Managed on ADT alone for biochemical occurrence, unable to tolerate apalutamide and not interested in addition of other oral agents at this time - Continue ADT indefinitely consider addition of oral med in future if PSA begins to rise  - PSA undetectable  - leuprolide (6 Month) (ELIGARD) injection 45 mg administered today - PSA; Future  Return in about 6 months (around 04/03/2023) for repeat PSA and Eligard injection.  I have reviewed the above documentation for accuracy and completeness, and I agree with the above.   Vanna Scotland, MD    [facility] [address], [address] [place], [place] [address] [phone]

## 2022-10-01 NOTE — Progress Notes (Signed)
Jack Weaver presents for an office visit. BP today is __155/88_________. He is complaint with BP medication. Greater than 140/90. Provider  notified. Pt advised to___f/u with PCP___________. Pt voiced understanding.

## 2022-11-05 ENCOUNTER — Other Ambulatory Visit: Payer: Self-pay | Admitting: Family Medicine

## 2022-11-05 DIAGNOSIS — K2970 Gastritis, unspecified, without bleeding: Secondary | ICD-10-CM

## 2022-11-18 DIAGNOSIS — M25532 Pain in left wrist: Secondary | ICD-10-CM | POA: Diagnosis not present

## 2022-12-10 ENCOUNTER — Other Ambulatory Visit: Payer: Self-pay | Admitting: Family Medicine

## 2022-12-10 DIAGNOSIS — I10 Essential (primary) hypertension: Secondary | ICD-10-CM

## 2023-01-19 DIAGNOSIS — L720 Epidermal cyst: Secondary | ICD-10-CM | POA: Diagnosis not present

## 2023-01-19 DIAGNOSIS — R208 Other disturbances of skin sensation: Secondary | ICD-10-CM | POA: Diagnosis not present

## 2023-01-19 DIAGNOSIS — L728 Other follicular cysts of the skin and subcutaneous tissue: Secondary | ICD-10-CM | POA: Diagnosis not present

## 2023-01-23 ENCOUNTER — Encounter: Payer: Self-pay | Admitting: Emergency Medicine

## 2023-01-23 ENCOUNTER — Ambulatory Visit
Admission: EM | Admit: 2023-01-23 | Discharge: 2023-01-23 | Disposition: A | Discharge status: Home / Self Care | Payer: Medicare HMO | Attending: Internal Medicine | Admitting: Internal Medicine

## 2023-01-23 DIAGNOSIS — W57XXXA Bitten or stung by nonvenomous insect and other nonvenomous arthropods, initial encounter: Secondary | ICD-10-CM | POA: Diagnosis not present

## 2023-01-23 DIAGNOSIS — S20462A Insect bite (nonvenomous) of left back wall of thorax, initial encounter: Secondary | ICD-10-CM

## 2023-01-23 DIAGNOSIS — L0390 Cellulitis, unspecified: Secondary | ICD-10-CM

## 2023-01-23 MED ORDER — DOXYCYCLINE HYCLATE 100 MG PO CAPS: 100.0000 mg | ORAL_CAPSULE | Freq: Two times a day (BID) | ORAL | 0 refills | Status: DC

## 2023-01-23 MED ORDER — METHYLPREDNISOLONE 4 MG PO TBPK: ORAL_TABLET | ORAL | 0 refills | Status: DC

## 2023-01-23 NOTE — ED Provider Notes (Signed)
[facility]    CSN: [id number] Arrival date & time: 01/23/23  1057      History   Chief Complaint Chief Complaint  Patient presents with   Insect Bite    Left back    HPI Jack Weaver is a 67 y.o. male who presents with redness, cluster of blisters on L back. States while driving his work [employer] day [employer] he felt a sting on L left back. He had been driving for a little while and had one of the back windows open. He pulled over at a restaurant and shook his shirt to look for what may have been an insect, but did not see anything. Has been taking Benadryl since the area was red, but has not been scratching it.  This am noticed the cluster of blisters where he got bit and the redness is larger.      Past Medical History:  Diagnosis Date   Arthritis    GERD (gastroesophageal reflux disease)    Hypertension    Prostate cancer (HCC)    Sleep apnea    no CPAP    Patient Active Problem List   Diagnosis Date Noted   H/O vitamin D deficiency 09/25/2022   Elevated alkaline phosphatase level 07/29/2022   Pre-diabetes 07/29/2022   Weight gain 07/24/2022   Class 1 drug-induced obesity with serious comorbidity and body mass index (BMI) of 32.0 to 32.9 in adult 07/24/2022   Hypertrophy of inferior nasal turbinate 07/08/2021   Tinnitus of both ears 06/28/2021   Encounter for smoking cessation counseling 06/28/2021   Acute sinusitis 03/12/2021   Bradycardia 03/12/2021   Carpal tunnel syndrome 03/12/2021   Cough 03/12/2021   Headache 03/12/2021   Nausea with vomiting 03/12/2021   Skin sensation disturbance 03/12/2021   Smell and taste disorder 03/12/2021   Sprain of knee 03/12/2021   Visual disturbance 03/12/2021   Screening for prostate cancer 03/12/2021   Lumbar radiculopathy 09/24/2020   AAA (abdominal aortic aneurysm) (HCC) 08/29/2020   Palpitations 03/29/2020   PVC's (premature ventricular contractions) 03/29/2020   Shortness of breath 03/29/2020    Postoperative ileus (HCC) 10/15/2018   Right lower quadrant abdominal pain    Prostate cancer (HCC) 10/11/2018   Lumbar sprain 09/04/2017   Traumatic rupture of quadriceps tendon 09/02/2017   Impacted cerumen 06/20/2015   Bursitis of hip 05/31/2015   Allergic rhinitis due to pollen 01/26/2015   Disequilibrium 01/26/2015   ED (erectile dysfunction) of organic origin 11/18/2014   Essential hypertension 11/18/2014   Neuropathy 11/18/2014   Male erectile dysfunction, unspecified 11/18/2014   Allergic rhinitis 11/14/2014   Hematuria 12/24/2009   Urinary tract infection 12/24/2009   Arthropathia 11/26/2009   Arthropathy, unspecified 11/26/2009   Sebaceous cyst 10/06/2008   History of tobacco use 07/31/2008   Acid reflux 07/26/2008   Arthritis, degenerative 07/26/2008   Gastro-esophageal reflux disease without esophagitis 07/26/2008   Apnea, sleep 06/30/2008    Past Surgical History:  Procedure Laterality Date   APPENDECTOMY     At age 11   CATARACT EXTRACTION W/PHACO Left 08/08/2020   Procedure: CATARACT EXTRACTION PHACO AND INTRAOCULAR LENS PLACEMENT (IOC) LEFT 5.62 01:00.4 903%;  Surgeon: Lockie Mola, MD;  Location: [facility];  Service: Ophthalmology;  Laterality: Left;   HEMORROIDECTOMY  1930   PELVIC LYMPH NODE DISSECTION Bilateral 10/11/2018   Procedure: PELVIC LYMPH NODE DISSECTION;  Surgeon: Vanna Scotland, MD;  Location: [facility];  Service: Urology;  Laterality: Bilateral;   QUADRICEPS TENDON REPAIR  Left 09/09/2017   Procedure: REPAIR QUADRICEP TENDON;  Surgeon: Deeann Saint, MD;  Location: [facility];  Service: Orthopedics;  Laterality: Left;   ROBOT ASSISTED LAPAROSCOPIC RADICAL PROSTATECTOMY N/A 10/11/2018   Procedure: ROBOTIC ASSISTED LAPAROSCOPIC RADICAL PROSTATECTOMY;  Surgeon: Vanna Scotland, MD;  Location: [facility];  Service: Urology;  Laterality: N/A;       Home Medications    Prior to Admission medications   Medication Sig Start Date End  Date Taking? Authorizing Provider  doxycycline (VIBRAMYCIN) 100 MG capsule Take 1 capsule (100 mg total) by mouth 2 (two) times daily. 01/23/23  Yes Rodriguez-Southworth, Nettie Elm, PA-C  methylPREDNISolone (MEDROL DOSEPAK) 4 MG TBPK tablet Take as directed 01/23/23  Yes Rodriguez-Southworth, Nettie Elm, PA-C  amLODipine (NORVASC) 10 MG tablet TAKE 1 TABLET EVERY DAY 05/16/22   Furth, Cadence H, PA-C  fluticasone (FLONASE) 50 MCG/ACT nasal spray  09/30/20   [provider]  gabapentin (NEURONTIN) 300 MG capsule gabapentin 300 mg capsule    [provider]  lisinopril (ZESTRIL) 40 MG tablet TAKE 1 TABLET EVERY DAY 12/10/22   Simmons-Robinson, Makiera, MD  methocarbamol (ROBAXIN) 750 MG tablet Take 750 mg by mouth 2 (two) times daily. 04/04/22   [provider]  metoprolol succinate (TOPROL-XL) 25 MG 24 hr tablet TAKE 1 TABLET EVERY DAY 08/31/22   Simmons-Robinson, Makiera, MD  Multiple Vitamin (MULTIVITAMIN WITH MINERALS) TABS tablet Take 1 tablet by mouth daily.    [provider]  omeprazole (PRILOSEC) 40 MG capsule TAKE 1 CAPSULE DAILY AS NEEDED (ACID REFLUX, INDIGESTION) 11/06/22   Simmons-Robinson, Tawanna Cooler, MD    Family History Family History  Problem Relation Age of Onset   Heart attack Mother    Stroke Father    Asthma Father    Cancer Brother    Diabetes Brother    Prostate cancer Neg Hx    Kidney cancer Neg Hx    Bladder Cancer Neg Hx     Social History Social History   Tobacco Use   Smoking status: Some Days    Types: Cigars   Smokeless tobacco: Never  Vaping Use   Vaping status: Never Used  Substance Use Topics   Alcohol use: Yes    Alcohol/week: 2.0 standard drinks of alcohol    Types: 2 Cans of beer per week    Comment: occasionally   Drug use: No     Allergies   Patient has no known allergies.   Review of Systems Review of Systems  As noted in HPI Physical Exam Triage Vital Signs ED Triage Vitals  Encounter Vitals Group     BP  01/23/23 1112 (!) 156/87     Systolic BP Percentile --      Diastolic BP Percentile --      Pulse Rate 01/23/23 1112 61     Resp 01/23/23 1112 15     Temp 01/23/23 1112 98.9 F (37.2 C)     Temp Source 01/23/23 1112 Oral     SpO2 01/23/23 1112 96 %     Weight 01/23/23 1111 227 lb (103 kg)     Height 01/23/23 1111 6' (1.829 m)     Head Circumference --      Peak Flow --      Pain Score 01/23/23 1111 3     Pain Loc --      Pain Education --      Exclude from Growth Chart --    No data found.  Updated Vital Signs BP (!) 156/87 (BP  Location: Left Arm)   Pulse 61   Temp 98.9 F (37.2 C) (Oral)   Resp 15   Ht 6' (1.829 m)   Wt 227 lb (103 kg)   SpO2 96%   BMI 30.79 kg/m   Visual Acuity Right Eye Distance:   Left Eye Distance:   Bilateral Distance:    Right Eye Near:   Left Eye Near:    Bilateral Near:     Physical Exam Vitals and nursing note reviewed.  Constitutional:      General: He is not in acute distress.    Appearance: He is obese. He is not toxic-appearing.  HENT:     Right Ear: External ear normal.     Left Ear: External ear normal.  Eyes:     General: No scleral icterus.    Conjunctiva/sclera: Conjunctivae normal.  Pulmonary:     Effort: Pulmonary effort is normal.  Musculoskeletal:        General: Normal range of motion.  Skin:    General: Skin is warm and dry.     Comments: L lower back at the bite area has a cluster or different shape blisters of about 1 cm area, the surrounding skin area about 5 inches out is hot, red but not tender.   Neurological:     Mental Status: He is alert and oriented to person, place, and time.     Gait: Gait normal.  Psychiatric:        Mood and Affect: Mood normal.        Behavior: Behavior normal.        Thought Content: Thought content normal.        Judgment: Judgment normal.      UC Treatments / Results  Labs (all labs ordered are listed, but only abnormal results are displayed) Labs Reviewed - No data  to display  EKG   Radiology No results found.  Procedures Procedures (including critical care time)  Medications Ordered in UC Medications - No data to display  Initial Impression / Assessment and Plan / UC Course  I have reviewed the triage vital signs and the nursing notes.  Insect bite allergic local reaction and cellulitis   I placed him on Medrol and Doxy as noted.   Final Clinical Impressions(s) / UC Diagnoses   Final diagnoses:  Cellulitis, unspecified cellulitis site  Insect bite of left back wall of thorax, initial encounter   Discharge Instructions   None    ED Prescriptions     Medication Sig Dispense Auth. Provider   methylPREDNISolone (MEDROL DOSEPAK) 4 MG TBPK tablet Take as directed 21 tablet Rodriguez-Southworth, Nettie Elm, PA-C   doxycycline (VIBRAMYCIN) 100 MG capsule Take 1 capsule (100 mg total) by mouth 2 (two) times daily. 20 capsule Rodriguez-Southworth, Nettie Elm, PA-C      PDMP not reviewed this encounter.   Garey Ham, Cordelia Poche 01/23/23 1129

## 2023-01-23 NOTE — ED Triage Notes (Signed)
Patient states that he got bit by something on Tuesday.  Patient has redness, swelling and pain at the site on his left back.

## 2023-02-05 ENCOUNTER — Ambulatory Visit (INDEPENDENT_AMBULATORY_CARE_PROVIDER_SITE_OTHER): Payer: Medicare HMO | Admitting: Family Medicine

## 2023-02-05 ENCOUNTER — Encounter: Payer: Self-pay | Admitting: Family Medicine

## 2023-02-05 VITALS — BP 141/79 | HR 54 | Temp 98.1°F | Ht 72.0 in | Wt 231.0 lb

## 2023-02-05 DIAGNOSIS — M79662 Pain in left lower leg: Secondary | ICD-10-CM | POA: Diagnosis not present

## 2023-02-05 DIAGNOSIS — I10 Essential (primary) hypertension: Secondary | ICD-10-CM | POA: Diagnosis not present

## 2023-02-05 DIAGNOSIS — Z23 Encounter for immunization: Secondary | ICD-10-CM

## 2023-02-05 DIAGNOSIS — M79605 Pain in left leg: Secondary | ICD-10-CM | POA: Diagnosis not present

## 2023-02-05 NOTE — Progress Notes (Signed)
Established patient visit   Patient: Jack Weaver   DOB: 08-16-1955   67 y.o. Male  MRN: [id number] Visit Date: 02/05/2023  Today's healthcare provider: Ronnald Ramp, MD   Chief Complaint  Patient presents with   Leg Cramps    Patient states that for about 4 months he has been having leg cramps in his shins.  He states it causes his feet to draw upward.  It happens mostly at night and especially after a strenuous walk.  He has been using some OTC muscle cramp medicine along with mustard.   Subjective     HPI     Leg Cramps    Additional comments: Patient states that for about 4 months he has been having leg cramps in his shins.  He states it causes his feet to draw upward.  It happens mostly at night and especially after a strenuous walk.  He has been using some OTC muscle cramp medicine along with mustard.      Last edited by Adline Peals, CMA on 02/05/2023  3:15 PM.       Discussed the use of AI scribe software for clinical note transcription with the patient, who gave verbal consent to proceed.  History of Present Illness   The patient, Jack Weaver, presents with complaints of cramping in the left leg, specifically in the front, extending from the shin to the foot. The cramping is severe enough to cause involuntary flexion of the foot. The symptoms are predominantly nocturnal and occur after the patient's daily three-mile walk, particularly if the walk is more aggressive than usual. The patient has noticed a new knot in the affected leg, the origin of which is unclear.  The patient has a history of surgery on the left knee, which may contribute to the current symptoms as he reports a feeling of weakness in the leg. The patient has been taking meloxicam for an unrelated elbow issue, but it does not appear to alleviate the leg cramping.  The patient also reports a struggle with memory recall during the consultation. The patient has a history of hypertension,  currently managed with amlodipine and lisinopril, and has been prescribed gabapentin for nerve pain. The patient's blood pressure readings have been slightly elevated, but he is resistant to adding more medication to his regimen.  The patient has been proactive in managing the leg cramping, including wearing supportive shoes during walks, stretching before walks, and attempting to treat with over-the-counter muscle cramp medicine and mustard, though these interventions have not been successful. The patient is considering consultation with an orthopedic specialist for further evaluation and management of the leg cramping.         Past Medical History:  Diagnosis Date   Arthritis    GERD (gastroesophageal reflux disease)    Hypertension    Prostate cancer (HCC)    Sleep apnea    no CPAP    Medications: Outpatient Medications Prior to Visit  Medication Sig   amLODipine (NORVASC) 10 MG tablet TAKE 1 TABLET EVERY DAY   fluticasone (FLONASE) 50 MCG/ACT nasal spray    gabapentin (NEURONTIN) 300 MG capsule gabapentin 300 mg capsule   lisinopril (ZESTRIL) 40 MG tablet TAKE 1 TABLET EVERY DAY   methocarbamol (ROBAXIN) 750 MG tablet Take 750 mg by mouth 2 (two) times daily.   metoprolol succinate (TOPROL-XL) 25 MG 24 hr tablet TAKE 1 TABLET EVERY DAY   Multiple Vitamin (MULTIVITAMIN WITH MINERALS) TABS tablet Take 1 tablet  by mouth daily.   omeprazole (PRILOSEC) 40 MG capsule TAKE 1 CAPSULE DAILY AS NEEDED (ACID REFLUX, INDIGESTION)   [DISCONTINUED] doxycycline (VIBRAMYCIN) 100 MG capsule Take 1 capsule (100 mg total) by mouth 2 (two) times daily.   [DISCONTINUED] methylPREDNISolone (MEDROL DOSEPAK) 4 MG TBPK tablet Take as directed   No facility-administered medications prior to visit.    Review of Systems  Last metabolic panel Lab Results  Component Value Date   GLUCOSE 96 02/05/2023   NA 143 02/05/2023   K 4.6 02/05/2023   CL 110 (H) 02/05/2023   CO2 20 02/05/2023   BUN 18  02/05/2023   CREATININE 1.20 02/05/2023   EGFR 65 02/05/2023   CALCIUM 9.3 02/05/2023   PHOS 3.8 03/27/2021   PROT 6.9 08/01/2022   ALBUMIN 4.5 08/01/2022   LABGLOB 2.4 08/01/2022   AGRATIO 1.9 08/01/2022   BILITOT 0.4 08/01/2022   ALKPHOS 115 08/01/2022   AST 24 08/01/2022   ALT 25 08/01/2022   ANIONGAP 7 07/23/2021        Objective    BP (!) 141/79 (BP Location: Right Arm, Patient Position: Sitting, Cuff Size: Normal)   Pulse (!) 54   Temp 98.1 F (36.7 C) (Oral)   Ht 6' (1.829 m)   Wt 231 lb (104.8 kg)   SpO2 100%   BMI 31.33 kg/m     Physical Exam  Physical Exam   VITALS: BP- 141/79 EXTREMITIES: No muscle spasms noted in the lateral aspect of the left leg. There is no LE edema, no signs of injruy, has no tenderness today, there is no edema, he has normal ROM of lower extremities with some left knee pain with movement (hx of surgery on left knee)       Results for orders placed or performed in visit on 02/05/23  Basic Metabolic Panel (BMET)  Result Value Ref Range   Glucose 96 70 - 99 mg/dL   BUN 18 8 - 27 mg/dL   Creatinine, Ser 8.46 0.76 - 1.27 mg/dL   eGFR 65 >96 EX/BMW/4.13   BUN/Creatinine Ratio 15 10 - 24   Sodium 143 134 - 144 mmol/L   Potassium 4.6 3.5 - 5.2 mmol/L   Chloride 110 (H) 96 - 106 mmol/L   CO2 20 20 - 29 mmol/L   Calcium 9.3 8.6 - 10.2 mg/dL  Magnesium  Result Value Ref Range   Magnesium 2.1 1.6 - 2.3 mg/dL    Assessment & Plan     Problem List Items Addressed This Visit     Essential hypertension   Relevant Orders   Basic Metabolic Panel (BMET) (Completed)   Magnesium (Completed)   Other Visit Diagnoses     Pain of left anterior lower extremity    -  Primary   Relevant Orders   Basic Metabolic Panel (BMET) (Completed)   Magnesium (Completed)   Need for influenza vaccination       Relevant Orders   Flu Vaccine Trivalent High Dose (Fluad) (Completed)           Left Leg Pain Patient reports cramping in the anterior  aspect of the left leg, particularly after aggressive walking. No signs of muscle spasms on examination. Patient already taking Meloxicam for elbow pain. -Recommended continuation of Meloxicam. -Advised patient to perform stretches after walking. -Plan to schedule appt with orthopedics for further evaluation and possible imaging per patient request, states he is established with orthopedic specialist and would find it more efficient to be seen there than  have imaging ordered today  -we discussed possible use of topical magnesium to see if it would help with leg symptoms in the event he does not have tibial stress fractures from his increased walking regimen, he voiced understanding   Hypertension Blood pressure slightly elevated at 141/79. Patient currently on Amlodipine 10mg , Lisinopril 40mg , and Metoprolol 25mg  daily. Patient declines additional medication. Goal is for BP to be less than 130/80, however given hx of elevation as high as 200/100s, pt does not wish to alter medication regimen further due to concern for SE during previous adjustments. Will adjust goal to under 150.90  -Continue current medication regimen. -Plan to monitor blood pressure.   Electrolyte Imbalance? Patient inquires about possible low magnesium levels. -Order lab work to check electrolyte levels and magnesium. -If magnesium is low, consider recommending magnesium lotion for topical application.         Return if symptoms worsen or fail to improve.         Ronnald Ramp, MD  [facility] [phone] (phone) [phone] (fax)  [facility]

## 2023-02-05 NOTE — Patient Instructions (Signed)
Please report to [facility] located at:  [place], [place]  You do not need an appointment to have xrays completed.   Our office will follow up with  results once available.

## 2023-02-06 LAB — BASIC METABOLIC PANEL
BUN/Creatinine Ratio: 15 (ref 10–24)
BUN: 18 mg/dL (ref 8–27)
CO2: 20 mmol/L (ref 20–29)
Calcium: 9.3 mg/dL (ref 8.6–10.2)
Chloride: 110 mmol/L — ABNORMAL HIGH (ref 96–106)
Creatinine, Ser: 1.2 mg/dL (ref 0.76–1.27)
Glucose: 96 mg/dL (ref 70–99)
Potassium: 4.6 mmol/L (ref 3.5–5.2)
Sodium: 143 mmol/L (ref 134–144)
eGFR: 65 mL/min/{1.73_m2} (ref >59)

## 2023-02-06 LAB — MAGNESIUM: Magnesium: 2.1 mg/dL (ref 1.6–2.3)

## 2023-02-09 DIAGNOSIS — M5416 Radiculopathy, lumbar region: Secondary | ICD-10-CM | POA: Diagnosis not present

## 2023-02-12 DIAGNOSIS — M5416 Radiculopathy, lumbar region: Secondary | ICD-10-CM | POA: Diagnosis not present

## 2023-02-17 DIAGNOSIS — H2511 Age-related nuclear cataract, right eye: Secondary | ICD-10-CM | POA: Diagnosis not present

## 2023-02-17 DIAGNOSIS — Z0100 Encounter for examination of eyes and vision without abnormal findings: Secondary | ICD-10-CM | POA: Diagnosis not present

## 2023-02-17 DIAGNOSIS — H43813 Vitreous degeneration, bilateral: Secondary | ICD-10-CM | POA: Diagnosis not present

## 2023-02-17 DIAGNOSIS — Z961 Presence of intraocular lens: Secondary | ICD-10-CM | POA: Diagnosis not present

## 2023-02-18 DIAGNOSIS — M5416 Radiculopathy, lumbar region: Secondary | ICD-10-CM | POA: Diagnosis not present

## 2023-02-18 DIAGNOSIS — H524 Presbyopia: Secondary | ICD-10-CM | POA: Diagnosis not present

## 2023-02-23 ENCOUNTER — Telehealth: Payer: Self-pay | Admitting: Family Medicine

## 2023-02-23 DIAGNOSIS — E279 Disorder of adrenal gland, unspecified: Secondary | ICD-10-CM

## 2023-02-23 DIAGNOSIS — M5416 Radiculopathy, lumbar region: Secondary | ICD-10-CM | POA: Diagnosis not present

## 2023-02-23 NOTE — Telephone Encounter (Signed)
Sending results back from [facility] of patient's MR Lumbar spine.   He is requesting a call back to let him know what needs to be done from this point since they have found a possible adrenal gland nodule.  [phone]

## 2023-02-24 NOTE — Telephone Encounter (Signed)
I will order renal CT for further evaluation of the adrenal concern. Please advise pt to be on lookout for a call to schedule   Reviewed imaging from [facility] MRI of the lumbar spine noting adrenal abnormality

## 2023-02-24 NOTE — Addendum Note (Signed)
Addended by: Bing Neighbors on: 02/24/2023 01:14 PM   Modules accepted: Orders

## 2023-03-04 ENCOUNTER — Other Ambulatory Visit: Payer: Self-pay | Admitting: Medical

## 2023-03-06 ENCOUNTER — Ambulatory Visit: Admission: RE | Admit: 2023-03-06 | Payer: Medicare HMO | Source: Ambulatory Visit

## 2023-03-10 DIAGNOSIS — R350 Frequency of micturition: Secondary | ICD-10-CM | POA: Diagnosis not present

## 2023-03-10 DIAGNOSIS — R319 Hematuria, unspecified: Secondary | ICD-10-CM | POA: Diagnosis not present

## 2023-03-18 ENCOUNTER — Ambulatory Visit
Admission: RE | Admit: 2023-03-18 | Discharge: 2023-03-18 | Disposition: A | Discharge status: Home / Self Care | Payer: Medicare HMO | Source: Ambulatory Visit | Attending: Family Medicine | Admitting: Family Medicine

## 2023-03-18 DIAGNOSIS — E278 Other specified disorders of adrenal gland: Secondary | ICD-10-CM | POA: Insufficient documentation

## 2023-03-18 DIAGNOSIS — E279 Disorder of adrenal gland, unspecified: Secondary | ICD-10-CM | POA: Insufficient documentation

## 2023-03-18 DIAGNOSIS — D1779 Benign lipomatous neoplasm of other sites: Secondary | ICD-10-CM | POA: Diagnosis not present

## 2023-03-18 DIAGNOSIS — I7143 Infrarenal abdominal aortic aneurysm, without rupture: Secondary | ICD-10-CM | POA: Insufficient documentation

## 2023-03-18 DIAGNOSIS — I700 Atherosclerosis of aorta: Secondary | ICD-10-CM | POA: Insufficient documentation

## 2023-03-18 DIAGNOSIS — R160 Hepatomegaly, not elsewhere classified: Secondary | ICD-10-CM | POA: Diagnosis not present

## 2023-03-18 HISTORY — DX: Infrarenal abdominal aortic aneurysm, without rupture: I71.43

## 2023-03-20 ENCOUNTER — Other Ambulatory Visit: Payer: Medicare HMO

## 2023-03-20 DIAGNOSIS — C61 Malignant neoplasm of prostate: Secondary | ICD-10-CM | POA: Diagnosis not present

## 2023-03-21 LAB — PSA: Prostate Specific Ag, Serum: <0.1 ng/mL (ref 0.0–4.0)

## 2023-03-24 ENCOUNTER — Ambulatory Visit: Payer: Medicare HMO | Admitting: Urology

## 2023-03-24 VITALS — BP 154/90 | HR 63 | Ht 71.0 in | Wt 234.0 lb

## 2023-03-24 DIAGNOSIS — E279 Disorder of adrenal gland, unspecified: Secondary | ICD-10-CM

## 2023-03-24 DIAGNOSIS — R310 Gross hematuria: Secondary | ICD-10-CM | POA: Diagnosis not present

## 2023-03-24 DIAGNOSIS — Z716 Tobacco abuse counseling: Secondary | ICD-10-CM | POA: Diagnosis not present

## 2023-03-24 DIAGNOSIS — R3129 Other microscopic hematuria: Secondary | ICD-10-CM | POA: Diagnosis not present

## 2023-03-24 DIAGNOSIS — C61 Malignant neoplasm of prostate: Secondary | ICD-10-CM | POA: Diagnosis not present

## 2023-03-24 LAB — URINALYSIS, COMPLETE
Bilirubin, UA: NEGATIVE
Glucose, UA: NEGATIVE
Ketones, UA: NEGATIVE
Leukocytes,UA: NEGATIVE
Nitrite, UA: NEGATIVE
Protein,UA: NEGATIVE
Specific Gravity, UA: 1.02 (ref 1.005–1.030)
Urobilinogen, Ur: 0.2 mg/dL (ref 0.2–1.0)
pH, UA: 6 (ref 5.0–7.5)

## 2023-03-24 LAB — MICROSCOPIC EXAMINATION: Bacteria, UA: NONE SEEN

## 2023-03-24 MED ORDER — LEUPROLIDE ACETATE (6 MONTH) 45 MG ~~LOC~~ KIT
45.0000 mg | PACK | Freq: Once | SUBCUTANEOUS | Status: AC
Start: 2023-03-24 — End: 2023-03-24
  Administered 2023-03-24: 45 mg via SUBCUTANEOUS

## 2023-03-24 NOTE — Progress Notes (Signed)
I,[name] L Pierron,acting as a scribe for Vanna Scotland, MD.,have documented all relevant documentation on the behalf of Vanna Scotland, MD,as directed by  Vanna Scotland, MD while in the presence of Vanna Scotland, MD.  03/24/2023 9:46 AM   Jack Weaver Sep 28, 1955 [id number]  Referring provider: Ronnald Ramp, MD [address] [place],  [place] [address]  Chief Complaint  Patient presents with   Prostate Cancer    HPI: 67 year-old male with a personal history of prostate cancer presents today for a 6 month follow-up.  He underwent radical prostatectomy non-nerve sparing with bilateral pelvic lymph node dissection on 10/01/2018.  Surgical pathology was consistent with Gleason 3+5 disease, pT3a N1.  He was noted to have extraprostatic extension with positive posterior margins both left and right.  He is also noted to have 2 of 3 lymph nodes on the left with focal metastatic disease.  0 of 3 lymph nodes on the right.  No bladder neck involvement.  No seminal vesicle involvement.   He underwent staging axumin PET scan on 11/08/2018 that was negative for metastatic disease and was started on Lupron. He declined whole pelvic radiation.    Testosterone indicated he did not achieve castrate levels in 2021 and a second dose of ADT was administered.  His PSA had risen to 0.7 In 2021.    He did not tolerate apalutamide as additional adjuvant treatment to his ADT.    His most recent PSA from 03/20/2023 remains undetectable.  He has a personal history of tobacco use.   His urinalysis today has 3 to 10 red blood cells per high powered field.  He mentioned he was not able to see the blood in his urine. He went to urgent care two weeks ago due to having pain near his bladder and they told him he had microscopic blood. He also had a foul odor, frequent urination, but no burning. They gave him an antibiotic for 5 days.   He recently had a scan for his adrenals and kidney  but the report isn't ready yet.   He said he still has leakage however it has been improving.   He is getting an injection in his spine this week due to having sciatica pain and numbness.  PMH: Past Medical History:  Diagnosis Date   Arthritis    GERD (gastroesophageal reflux disease)    Hypertension    Prostate cancer (HCC)    Sleep apnea    no CPAP    Surgical History: Past Surgical History:  Procedure Laterality Date   APPENDECTOMY     At age 64   CATARACT EXTRACTION W/PHACO Left 08/08/2020   Procedure: CATARACT EXTRACTION PHACO AND INTRAOCULAR LENS PLACEMENT (IOC) LEFT 5.62 01:00.4 903%;  Surgeon: Lockie Mola, MD;  Location: [facility];  Service: Ophthalmology;  Laterality: Left;   HEMORROIDECTOMY  1930   PELVIC LYMPH NODE DISSECTION Bilateral 10/11/2018   Procedure: PELVIC LYMPH NODE DISSECTION;  Surgeon: Vanna Scotland, MD;  Location: [facility];  Service: Urology;  Laterality: Bilateral;   QUADRICEPS TENDON REPAIR Left 09/09/2017   Procedure: REPAIR QUADRICEP TENDON;  Surgeon: Deeann Saint, MD;  Location: [facility];  Service: Orthopedics;  Laterality: Left;   ROBOT ASSISTED LAPAROSCOPIC RADICAL PROSTATECTOMY N/A 10/11/2018   Procedure: ROBOTIC ASSISTED LAPAROSCOPIC RADICAL PROSTATECTOMY;  Surgeon: Vanna Scotland, MD;  Location: [facility];  Service: Urology;  Laterality: N/A;    Home Medications:  Allergies as of 03/24/2023   No Known Allergies  Medication List        Accurate as of March 24, 2023  9:46 AM. If you have any questions, ask your nurse or doctor.          amLODipine 10 MG tablet Commonly known as: NORVASC Take 1 tablet (10 mg total) by mouth daily. PLEASE CALL [phone] TO SCHEDULE YEARLY FOLLOW UP VISIT AND TO RECEIVE FURTHER REFILLS. THANK YOU.   fluticasone 50 MCG/ACT nasal spray Commonly known as: FLONASE   gabapentin 300 MG capsule Commonly known as: NEURONTIN gabapentin 300 mg capsule   lisinopril 40 MG  tablet Commonly known as: ZESTRIL TAKE 1 TABLET EVERY DAY   methocarbamol 750 MG tablet Commonly known as: ROBAXIN Take 750 mg by mouth 2 (two) times daily.   metoprolol succinate 25 MG 24 hr tablet Commonly known as: TOPROL-XL TAKE 1 TABLET EVERY DAY   multivitamin with minerals Tabs tablet Take 1 tablet by mouth daily.   omeprazole 40 MG capsule Commonly known as: PRILOSEC TAKE 1 CAPSULE DAILY AS NEEDED (ACID REFLUX, INDIGESTION)        Family History: Family History  Problem Relation Age of Onset   Heart attack Mother    Stroke Father    Asthma Father    Cancer Brother    Diabetes Brother    Prostate cancer Neg Hx    Kidney cancer Neg Hx    Bladder Cancer Neg Hx     Social History:  reports that he has been smoking cigars. He has never used smokeless tobacco. He reports current alcohol use of about 2.0 standard drinks of alcohol per week. He reports that he does not use drugs.   Physical Exam: BP (!) 154/90   Pulse 63   Ht 5\' 11"  (1.803 m)   Wt 234 lb (106.1 kg)   BMI 32.64 kg/m   Constitutional:  Alert and oriented, No acute distress. HEENT: [place] AT, moist mucus membranes.  Trachea midline, no masses. Neurologic: Grossly intact, no focal deficits, moving all 4 extremities. Psychiatric: Normal mood and affect.   Assessment & Plan:    1. Prostate cancer  - PSA remains undetectable. Eligard given today.  - Managed on ADT alone for biochemical occurrence, unable to tolerate apalutamide and not interested in addition of other oral agents at this time - Continue ADT indefinitely consider addition of oral med in future if PSA begins to rise.   2. Smoking cessation  - He has decreased to 1 cigar a day.   - Encouraged stopping for his health.  3. Microscopic hematuria  -  It's possible he had an infection a few weeks ago but we don't have the data. He was having worse incontinence at the time. It seems to be improving. He still has microscopic hematuria.  Because it's persistent and he has a personal history of continued tobacco use, I think it's prudent to proceed with cystoscopy. He is agreeable to having it done. Will hold all further imaging for now.   - Plan to recheck urine at cystoscopy visit. It microscopic hematuria persists will consider additional upper tract imaging if indicated.  4. Adrenal lesion  - Incident on MRI. He's had an adrenal study it's not been interpreted by radiology. Will follow this up at his next appointment.  Return in about 6 months (around 09/22/2023) for Eligard.  I have reviewed the above documentation for accuracy and completeness, and I agree with the above.   Vanna Scotland, MD   [facility] [address]  [address] [place], [place] [address] [phone]  I spent 45 total minutes on the day of the encounter including pre-visit review of the medical record, face-to-face time with the patient, and post visit ordering of labs/imaging/tests.

## 2023-03-24 NOTE — Patient Instructions (Signed)

## 2023-03-24 NOTE — Progress Notes (Signed)
Eligard SubQ Injection   Due to Prostate Cancer patient is present today for a Eligard Injection.  Medication: Eligard 6 month Dose: 45 mg  Location: left arm  Lot: [id number] Exp: 03/2024  Patient tolerated well, no complications were noted  Performed by: Jearld Pies RMA  Per Dr. Apolinar Junes patient is to continue therapy for indefinitely. Patient's next follow up was scheduled for 09/15/2023. This appointment was scheduled using wheel and given to patient today along with reminder continue on Vitamin D 800-1000iu and Calcium 1000-1200mg  daily while on Androgen Deprivation Therapy.  PA approval dates: NO PA required

## 2023-03-27 DIAGNOSIS — M5416 Radiculopathy, lumbar region: Secondary | ICD-10-CM | POA: Diagnosis not present

## 2023-04-10 DIAGNOSIS — M5416 Radiculopathy, lumbar region: Secondary | ICD-10-CM | POA: Diagnosis not present

## 2023-04-13 ENCOUNTER — Other Ambulatory Visit: Payer: Self-pay | Admitting: Family Medicine

## 2023-04-13 DIAGNOSIS — I7143 Infrarenal abdominal aortic aneurysm, without rupture: Secondary | ICD-10-CM

## 2023-04-13 DIAGNOSIS — K769 Liver disease, unspecified: Secondary | ICD-10-CM | POA: Insufficient documentation

## 2023-04-13 NOTE — Assessment & Plan Note (Signed)
3.8cm abdominal aortic aneurysm below the kidney that has increased mildly in size that is recommended for ultrasound follow up in 2027  Noted in April 2022

## 2023-04-13 NOTE — Progress Notes (Signed)
CT imaging showed right sided adrenal mass that is stable- no additional follow up recommended   There was a 3.8cm abdominal aortic aneurysm below the kidney that has increased mildly in size that is recommended for ultrasound follow up in 2027  There is also a 18mm mass in the dome of the liver that is possibly a cyst but needs contrast imaging with MRI to further characterize, other cysts are noted in the liver   Will proceed with ordering abdominal MRI with and without contrast to follow up on the findings noted

## 2023-04-22 ENCOUNTER — Ambulatory Visit: Payer: Medicare HMO | Admitting: Urology

## 2023-04-22 VITALS — BP 168/95 | HR 68

## 2023-04-22 DIAGNOSIS — R3129 Other microscopic hematuria: Secondary | ICD-10-CM | POA: Diagnosis not present

## 2023-04-22 NOTE — Progress Notes (Signed)
   04/22/23  CC:  Chief Complaint  Patient presents with   Cysto    HPI: 67 year old male with a personal history of prostate cancer who presents today for cystoscopy for further evaluation of microscopic hematuria.  His urinalysis today is negative but has had small amount of blood previously.  Blood pressure (!) 168/95, pulse 68. NED. A&Ox3.   No respiratory distress   Abd soft, NT, ND Normal phallus with bilateral descended testicles  Cystoscopy Procedure Note  Patient identification was confirmed, informed consent was obtained, and patient was prepped using Betadine solution.  Lidocaine jelly was administered per urethral meatus.     Pre-Procedure: - Inspection reveals a normal caliber ureteral meatus.  Procedure: The flexible cystoscope was introduced without difficulty - No urethral strictures/lesions are present. - Surgically absent prostate  - Normal bladder neck - Bilateral ureteral orifices identified - Bladder mucosa  reveals no ulcers, tumors, or lesions - No bladder stones - No trabeculation  Retroflexion unremarkable    Post-Procedure: - Patient tolerated the procedure well  Assessment/ Plan:  1. Other microscopic hematuria Cystoscopy today was unremarkable  He recently had a CT adrenal study with no upper tract pathology.  He had a stable right adrenal myolipoma.  Given that his urinalysis today is negative, we will defer additional upper tract imaging for the time being  Follow-up for next Eligard injection/PSA in April as scheduled - Urinalysis, Complete   Vanna Scotland, MD

## 2023-04-25 ENCOUNTER — Other Ambulatory Visit: Payer: Self-pay | Admitting: Medical

## 2023-04-28 LAB — MICROSCOPIC EXAMINATION
Bacteria, UA: NONE SEEN
RBC, Urine: NONE SEEN /[HPF] (ref 0–2)

## 2023-04-28 LAB — URINALYSIS, COMPLETE
Bilirubin, UA: NEGATIVE
Glucose, UA: NEGATIVE
Ketones, UA: NEGATIVE
Leukocytes,UA: NEGATIVE
Nitrite, UA: NEGATIVE
Protein,UA: NEGATIVE
Specific Gravity, UA: <1.005 — ABNORMAL LOW (ref 1.005–1.030)
Urobilinogen, Ur: 0.2 mg/dL (ref 0.2–1.0)
pH, UA: 5.5 (ref 5.0–7.5)

## 2023-05-21 ENCOUNTER — Other Ambulatory Visit: Payer: Self-pay | Admitting: Internal Medicine

## 2023-05-21 NOTE — Telephone Encounter (Signed)
Please contact pt for future appointment. Pt due for follow up.

## 2023-06-13 ENCOUNTER — Other Ambulatory Visit: Payer: Self-pay | Admitting: Internal Medicine

## 2023-06-13 DIAGNOSIS — T1490XA Injury, unspecified, initial encounter: Secondary | ICD-10-CM | POA: Diagnosis not present

## 2023-06-13 DIAGNOSIS — M542 Cervicalgia: Secondary | ICD-10-CM | POA: Diagnosis not present

## 2023-06-13 DIAGNOSIS — M545 Low back pain, unspecified: Secondary | ICD-10-CM | POA: Diagnosis not present

## 2023-06-20 ENCOUNTER — Other Ambulatory Visit: Payer: Self-pay | Admitting: Family Medicine

## 2023-06-22 ENCOUNTER — Ambulatory Visit: Payer: Medicare HMO | Attending: Medical | Admitting: Medical

## 2023-06-22 ENCOUNTER — Encounter: Payer: Self-pay | Admitting: Medical

## 2023-06-22 VITALS — BP 124/72 | HR 70 | Ht 71.0 in | Wt 235.8 lb

## 2023-06-22 DIAGNOSIS — I10 Essential (primary) hypertension: Secondary | ICD-10-CM

## 2023-06-22 MED ORDER — AMLODIPINE BESYLATE 10 MG PO TABS: 10.0000 mg | ORAL_TABLET | Freq: Every day | ORAL | 3 refills | Status: DC

## 2023-06-22 NOTE — Progress Notes (Signed)
Cardiology Office Note:    Date:  06/22/2023   ID:  Jack Weaver, DOB 08-07-1955, MRN [id number]  PCP:  Ronnald Ramp, MD  [facility] Cardiologist:  Yvonne Kendall, MD  [facility] Electrophysiologist:  None   Referring MD: Brett Albino*   Chief Complaint: 1 year follow-up  History of Present Illness:    Jack Weaver is a 68 y.o. male with a hx of hypertension, prostate cancer s/p prostatectomy, arthritis who presents for HTN follow-up.    Seen in 2021 for palpitations and PVCs. Heart monitor showed NSR, average Hr 57 with brief episodes of PSVT, NSVT and rare PVCs/bigeminy. Echo showed LVEF 55-60%, mod LVH, G2DD. Metoprolol improved palpitations. BP was high but patient didn't want to start another medication.   The patient was last seen 03/2022 and BP was still a little high. Lifestyle changes were recommended.   Today, the patient is overall doing well from a cardiac perspective. He denies chest pain, SOB, lower leg edema, orthopnea or pnd. He has been struggling with sciatica, but this seems to be improving with treatment. Patient still tries to walk despite nerve pain. He denies dizziness or lightheadedness. He is trying to watch what he eats.   Past Medical History:  Diagnosis Date   Arthritis    GERD (gastroesophageal reflux disease)    Hypertension    Prostate cancer (HCC)    Sleep apnea    no CPAP    Past Surgical History:  Procedure Laterality Date   APPENDECTOMY     At age 31   CATARACT EXTRACTION W/PHACO Left 08/08/2020   Procedure: CATARACT EXTRACTION PHACO AND INTRAOCULAR LENS PLACEMENT (IOC) LEFT 5.62 01:00.4 903%;  Surgeon: Lockie Mola, MD;  Location: [facility];  Service: Ophthalmology;  Laterality: Left;   HEMORROIDECTOMY  1930   PELVIC LYMPH NODE DISSECTION Bilateral 10/11/2018   Procedure: PELVIC LYMPH NODE DISSECTION;  Surgeon: Vanna Scotland, MD;  Location: [facility];  Service: Urology;  Laterality:  Bilateral;   QUADRICEPS TENDON REPAIR Left 09/09/2017   Procedure: REPAIR QUADRICEP TENDON;  Surgeon: Deeann Saint, MD;  Location: [facility];  Service: Orthopedics;  Laterality: Left;   ROBOT ASSISTED LAPAROSCOPIC RADICAL PROSTATECTOMY N/A 10/11/2018   Procedure: ROBOTIC ASSISTED LAPAROSCOPIC RADICAL PROSTATECTOMY;  Surgeon: Vanna Scotland, MD;  Location: [facility];  Service: Urology;  Laterality: N/A;    Current Medications: Current Meds  Medication Sig   fluticasone (FLONASE) 50 MCG/ACT nasal spray    gabapentin (NEURONTIN) 300 MG capsule gabapentin 300 mg capsule   lisinopril (ZESTRIL) 40 MG tablet TAKE 1 TABLET EVERY DAY   methocarbamol (ROBAXIN) 750 MG tablet Take 750 mg by mouth 2 (two) times daily.   metoprolol succinate (TOPROL-XL) 25 MG 24 hr tablet TAKE 1 TABLET EVERY DAY   Multiple Vitamin (MULTIVITAMIN WITH MINERALS) TABS tablet Take 1 tablet by mouth daily.   omeprazole (PRILOSEC) 40 MG capsule TAKE 1 CAPSULE DAILY AS NEEDED (ACID REFLUX, INDIGESTION)   [DISCONTINUED] amLODipine (NORVASC) 10 MG tablet TAKE 1 TABLET EVERY DAY (PT MUST KEEP APPT IN JANUARY 2025 WITH CARDIOLOGISH BEFORE ANYMORE REFILLS. THANK FINAL ATTEMPT)     Allergies:   Patient has no known allergies.   Social History   Socioeconomic History   Marital status: Married    Spouse name: Not on file   Number of children: Not on file   Years of education: Not on file   Highest education level: Not on file  Occupational History   Not on file  Tobacco  Use   Smoking status: Some Days    Types: Cigars   Smokeless tobacco: Never  Vaping Use   Vaping status: Never Used  Substance and Sexual Activity   Alcohol use: Yes    Alcohol/week: 2.0 standard drinks of alcohol    Types: 2 Cans of beer per week    Comment: occasionally   Drug use: No   Sexual activity: Not on file  Other Topics Concern   Not on file  Social History Narrative   Not on file   Social Drivers of Health   Financial Resource Strain:  Low Risk  (09/23/2022)   Overall Financial Resource Strain (CARDIA)    Difficulty of Paying Living Expenses: Not hard at all  Food Insecurity: No Food Insecurity (09/23/2022)   Hunger Vital Sign    Worried About Running Out of Food in the Last Year: Never true    Ran Out of Food in the Last Year: Never true  Transportation Needs: No Transportation Needs (09/23/2022)   PRAPARE - Administrator, Civil Service (Medical): No    Lack of Transportation (Non-Medical): No  Physical Activity: Sufficiently Active (09/23/2022)   Exercise Vital Sign    Days of Exercise per Week: 6 days    Minutes of Exercise per Session: 70 min  Stress: No Stress Concern Present (09/23/2022)   [employer] of Occupational Health - Occupational Stress Questionnaire    Feeling of Stress : Not at all  Social Connections: Moderately Integrated (09/23/2022)   Social Connection and Isolation Panel [NHANES]    Frequency of Communication with Friends and Family: More than three times a week    Frequency of Social Gatherings with Friends and Family: Twice a week    Attends Religious Services: 1 to 4 times per year    Active Member of [employer] or Organizations: No    Attends Engineer, structural: Never    Marital Status: Married     Family History: The patient's family history includes Asthma in his father; Cancer in his brother; Diabetes in his brother; Heart attack in his mother; Stroke in his father. There is no history of Prostate cancer, Kidney cancer, or Bladder Cancer.  ROS:   Please see the history of present illness.     All other systems reviewed and are negative.  EKGs/Labs/Other Studies Reviewed:    The following studies were reviewed today:  Echo 03/2020 1. Left ventricular ejection fraction, by estimation, is 55 to 60%. The  left ventricle has normal function. The left ventricle has no regional  wall motion abnormalities. There is moderate left ventricular hypertrophy.  Left  ventricular diastolic  parameters are consistent with Grade II diastolic dysfunction  (pseudonormalization). The average left ventricular global longitudinal  strain is -16.3 %. The global longitudinal strain is abnormal.   2. Right ventricular systolic function is normal. The right ventricular  size is mildly enlarged. There is normal pulmonary artery systolic  pressure.   3. Left atrial size was moderately dilated.   4. Right atrial size was mildly dilated.   5. The mitral valve is normal in structure. Mild to moderate mitral valve  regurgitation. No evidence of mitral stenosis.   6. Tricuspid valve regurgitation is mild to moderate.   7. The aortic valve is tricuspid. There is mild calcification of the  aortic valve. There is moderate thickening of the aortic valve. Aortic  valve regurgitation is not visualized. Mild to moderate aortic valve  sclerosis/calcification is present, without  any evidence of aortic stenosis.   8. The inferior vena cava is normal in size with <50% respiratory  variability, suggesting right atrial pressure of 8 mmHg.   Heart monitor 03/2020 The patient was monitored for 6 days, 17 hours. The predominant rhythm was sinus with an average rate of 56 bpm (range 42-132 bpm in sinus). There were rare PACs and occasional PVCs (PVC burden 3.6%). 10 atrial runs lasting up to 16 beats with a maximum rate of 179 bpm were observed. A single 6 beat run of nonsustained ventricular tachycardia also occurred. No sustained arrhythmia or prolonged pause was identified. There were 117 patient triggered events during the monitoring period, corresponding to sinus rhythm, PACs, PVCs (including ventricular bigeminy), PSVT, and NSVT.   Predominately sinus rhythm with occasional PVCs and rare PACs.  Brief episodes of PSVT and NSVT also noted.  Numerous patient triggered events recorded, many of which correspond to ventricular bigeminy.  EKG:  EKG is ordered today.  The ekg ordered  today demonstrates NSR 70bpm, nonspecific T wave changes  Recent Labs: 07/24/2022: TSH 1.290 08/01/2022: ALT 25 02/05/2023: BUN 18; Creatinine, Ser 1.20; Magnesium 2.1; Potassium 4.6; Sodium 143  Recent Lipid Panel    Component Value Date/Time   CHOL 173 02/07/2022 0959   CHOL 178 06/22/2011 0025   TRIG 108 02/07/2022 0959   TRIG 109 06/22/2011 0025   HDL 52 02/07/2022 0959   HDL 46 06/22/2011 0025   CHOLHDL 3.3 02/07/2022 0959   VLDL 22 06/22/2011 0025   LDLCALC 101 (H) 02/07/2022 0959   LDLCALC 110 (H) 06/22/2011 0025    Physical Exam:    VS:  BP 124/72 (BP Location: Left Arm, Patient Position: Sitting, Cuff Size: Normal)   Pulse 70   Ht 5\' 11"  (1.803 m)   Wt 235 lb 12.8 oz (107 kg)   SpO2 99%   BMI 32.89 kg/m     Wt Readings from Last 3 Encounters:  06/22/23 235 lb 12.8 oz (107 kg)  03/24/23 234 lb (106.1 kg)  02/05/23 231 lb (104.8 kg)     GEN:  Well nourished, well developed in no acute distress HEENT: Normal NECK: No JVD; No carotid bruits LYMPHATICS: No lymphadenopathy CARDIAC: RRR, no murmurs, rubs, gallops RESPIRATORY:  Clear to auscultation without rales, wheezing or rhonchi  ABDOMEN: Soft, non-tender, non-distended MUSCULOSKELETAL:  No edema; No deformity  SKIN: Warm and dry NEUROLOGIC:  Alert and oriented x 3 PSYCHIATRIC:  Normal affect   ASSESSMENT:    1. Essential hypertension    PLAN:    In order of problems listed above:  HTN Blood pressure is normal today. I will refill amlodipine today. Labs this year done by PCP. Continue amlodipine 10mg  daily, lisinopril 40mg  daily and Toprol 25mg  daily. Patient tries to stay active and eat healthy, sciatica is limiting function.  Disposition: Follow up in 1 year(s) with MD/APP    Signed, Ronda Rajkumar David Stall, PA-C  06/22/2023 9:28 AM    [facility]

## 2023-06-22 NOTE — Patient Instructions (Signed)
Medication Instructions:  Your physician recommends that you continue on your current medications as directed. Please refer to the Current Medication list given to you today.   *If you need a refill on your cardiac medications before your next appointment, please call your pharmacy*   Lab Work: No labs ordered today    Testing/Procedures: No test ordered today    Follow-Up: At [facility], you and your health needs are our priority.  As part of our continuing mission to provide you with exceptional heart care, we have created designated Provider Care Teams.  These Care Teams include your primary Cardiologist (physician) and Advanced Practice Providers (APPs -  Physician Assistants and Nurse Practitioners) who all work together to provide you with the care you need, when you need it.  We recommend signing up for the patient portal called "MyChart".  Sign up information is provided on this After Visit Summary.  MyChart is used to connect with patients for Virtual Visits (Telemedicine).  Patients are able to view lab/test results, encounter notes, upcoming appointments, etc.  Non-urgent messages can be sent to your provider as well.   To learn more about what you can do with MyChart, go to [email or web address].    Your next appointment:   1 year(s)  Provider:   You may see Yvonne Kendall, MD or one of the following Advanced Practice Providers on your designated Care Team:   Nicolasa Ducking, NP Eula Listen, PA-C Cadence Fransico Michael, PA-C Charlsie Quest, NP Carlos Levering, NP

## 2023-07-07 ENCOUNTER — Other Ambulatory Visit: Payer: Self-pay | Admitting: Internal Medicine

## 2023-07-08 DIAGNOSIS — M5416 Radiculopathy, lumbar region: Secondary | ICD-10-CM | POA: Diagnosis not present

## 2023-08-06 ENCOUNTER — Telehealth: Payer: Self-pay

## 2023-08-06 DIAGNOSIS — Z1211 Encounter for screening for malignant neoplasm of colon: Secondary | ICD-10-CM

## 2023-08-06 NOTE — Telephone Encounter (Signed)
 Copied from CRM [id number]. Topic: General - Other >> Aug 06, 2023  2:31 PM Emylou G wrote: Reason for CRM: adv patient of msg.. referral sent out and they will contact him

## 2023-08-06 NOTE — Addendum Note (Signed)
 Addended by: Thedora Hinders on: 08/06/2023 01:56 PM   Modules accepted: Orders

## 2023-08-06 NOTE — Telephone Encounter (Signed)
 Referral has been placed for a colonoscopy, called but was only able to leave a voice message informing the pt, someone should be contacting him to get scheduled to have that procedure done.

## 2023-08-06 NOTE — Telephone Encounter (Signed)
 Copied from CRM [id number]. Topic: Appointments - Scheduling Inquiry for Clinic >> Aug 05, 2023 11:01 AM Shon Hale wrote: Reason for CRM: Patient requesting an appointment for a colon screening. Patient states he has had one before believes it may have been around 2006.   Patient requesting assistance with getting this scheduled, [phone]

## 2023-08-13 ENCOUNTER — Other Ambulatory Visit: Payer: Self-pay

## 2023-08-13 ENCOUNTER — Telehealth: Payer: Self-pay

## 2023-08-13 DIAGNOSIS — Z1211 Encounter for screening for malignant neoplasm of colon: Secondary | ICD-10-CM

## 2023-08-13 MED ORDER — PEG 3350-KCL-NA BICARB-NACL 420 G PO SOLR: 4000.0000 mL | Freq: Once | ORAL | 0 refills | Status: AC

## 2023-08-13 NOTE — Telephone Encounter (Signed)
 Patient has cardiac history: AAA PVC  Clearance sent to heart care preop team.  Thanks,  Marcelino Duster, CMA

## 2023-08-13 NOTE — Telephone Encounter (Signed)
 Gastroenterology Pre-Procedure Review  Request Date: 09/17/23 Requesting Physician: Dr. Tobi Bastos  PATIENT REVIEW QUESTIONS: The patient responded to the following health history questions as indicated:    1. Are you having any GI issues? no 2. Do you have a personal history of Polyps? no 3. Do you have a family history of Colon Cancer or Polyps? no 4. Diabetes Mellitus? no 5. Joint replacements in the past 12 months?no 6. Major health problems in the past 3 months?no 7. Any artificial heart valves, MVP, or defibrillator?no    MEDICATIONS & ALLERGIES:    Patient reports the following regarding taking any anticoagulation/antiplatelet therapy:   Plavix, Coumadin, Eliquis, Xarelto, Lovenox, Pradaxa, Brilinta, or Effient? no Aspirin? no  Patient confirms/reports the following medications:  Current Outpatient Medications  Medication Sig Dispense Refill   polyethylene glycol-electrolytes (NULYTELY) 420 g solution Take 4,000 mLs by mouth once for 1 dose. 4000 mL 0   amLODipine (NORVASC) 10 MG tablet Take 1 tablet (10 mg total) by mouth daily. 90 tablet 3   fluticasone (FLONASE) 50 MCG/ACT nasal spray      gabapentin (NEURONTIN) 300 MG capsule gabapentin 300 mg capsule     lisinopril (ZESTRIL) 40 MG tablet TAKE 1 TABLET EVERY DAY 90 tablet 3   methocarbamol (ROBAXIN) 750 MG tablet Take 750 mg by mouth 2 (two) times daily.     metoprolol succinate (TOPROL-XL) 25 MG 24 hr tablet TAKE 1 TABLET EVERY DAY 90 tablet 3   Multiple Vitamin (MULTIVITAMIN WITH MINERALS) TABS tablet Take 1 tablet by mouth daily.     omeprazole (PRILOSEC) 40 MG capsule TAKE 1 CAPSULE DAILY AS NEEDED (ACID REFLUX, INDIGESTION) 90 capsule 3   No current facility-administered medications for this visit.    Patient confirms/reports the following allergies:  No Known Allergies  No orders of the defined types were placed in this encounter.   AUTHORIZATION INFORMATION Primary Insurance: 1D#: Group #:  Secondary  Insurance: 1D#: Group #:  SCHEDULE INFORMATION: Date: 09/17/23 Time: Location: [facility]

## 2023-08-14 ENCOUNTER — Telehealth: Payer: Self-pay

## 2023-08-14 NOTE — Telephone Encounter (Signed)
   Patient Name: Jack Weaver  DOB: [birth year] MRN: [id number]  Primary Cardiologist: Yvonne Kendall, MD  Chart reviewed as part of pre-operative protocol coverage. Patient was seen in the office on 06/22/2023 by Cadence Furth, PA, and was doing well.  He denied any new symptoms or concerns.  He was able to complete greater than 4 METS. Therefore, given past medical history and time since last visit, based on ACC/AHA guidelines, AXTEN PASCUCCI is at acceptable risk for the planned procedure without further cardiovascular testing.   I will route this recommendation to the requesting party via Epic fax function and remove from pre-op pool.  Please call with questions.  Joylene Grapes, NP 08/14/2023, 12:33 PM

## 2023-08-14 NOTE — Telephone Encounter (Signed)
   Pre-operative Risk Assessment    Patient Name: Jack Weaver  DOB: April 02, 1956 MRN: [id number]   Date of last office visit: 06/22/23 Marianne Sofia, PA-C Date of next office visit: NONE   Request for Surgical Clearance    Procedure:   COLONOSCOPY  Date of Surgery:  Clearance 09/17/23                                Surgeon:  DR Tobi Bastos Surgeon's Group or Practice Name:  [facility] GASTROENTEROLOGY Phone number:  [phone] ATTN: MICHELLE Fax number:  [phone]   Type of Clearance Requested:   - Medical    Type of Anesthesia:  General    Additional requests/questions:    SignedMarlow Baars   08/14/2023, 11:49 AM

## 2023-08-17 NOTE — Telephone Encounter (Signed)
  Chart reviewed as part of pre-operative protocol coverage. Patient was seen in the office on 06/22/2023 by Cadence Furth, PA, and was doing well.  He denied any new symptoms or concerns.  He was able to complete greater than 4 METS. Therefore, given past medical history and time since last visit, based on ACC/AHA guidelines, ZYQUAN CROTTY is at acceptable risk for the planned procedure without further cardiovascular testing.    I will route this recommendation to the requesting party via Epic fax function and remove from pre-op pool.   Please call with questions.   Joylene Grapes, NP 08/14/2023, 12:33 PM

## 2023-09-09 ENCOUNTER — Other Ambulatory Visit: Payer: Self-pay

## 2023-09-09 DIAGNOSIS — E279 Disorder of adrenal gland, unspecified: Secondary | ICD-10-CM

## 2023-09-09 DIAGNOSIS — C61 Malignant neoplasm of prostate: Secondary | ICD-10-CM

## 2023-09-09 DIAGNOSIS — Z716 Tobacco abuse counseling: Secondary | ICD-10-CM

## 2023-09-09 DIAGNOSIS — R3129 Other microscopic hematuria: Secondary | ICD-10-CM

## 2023-09-10 LAB — PSA: Prostate Specific Ag, Serum: <0.1 ng/mL (ref 0.0–4.0)

## 2023-09-11 ENCOUNTER — Other Ambulatory Visit: Payer: Self-pay | Admitting: Family Medicine

## 2023-09-11 ENCOUNTER — Other Ambulatory Visit: Payer: Medicare HMO

## 2023-09-11 DIAGNOSIS — K2970 Gastritis, unspecified, without bleeding: Secondary | ICD-10-CM

## 2023-09-11 NOTE — Telephone Encounter (Signed)
 Requested Prescriptions  Refused Prescriptions Disp Refills   omeprazole  (PRILOSEC) 40 MG capsule [Pharmacy Med Name: Omeprazole  Oral Capsule Delayed Release 40 MG] 90 capsule 3    Sig: TAKE 1 CAPSULE DAILY AS NEEDED (ACID REFLUX, INDIGESTION)     Gastroenterology: Proton Pump Inhibitors Failed - 09/11/2023  2:50 PM      Failed - Valid encounter within last 12 months    Recent Outpatient Visits   None     Future Appointments             In 4 days Dustin Gimenez, MD [facility] Urology [place]

## 2023-09-15 ENCOUNTER — Ambulatory Visit: Payer: Self-pay | Admitting: Urology

## 2023-09-16 ENCOUNTER — Encounter: Payer: Self-pay | Admitting: Gastroenterology

## 2023-09-17 ENCOUNTER — Ambulatory Visit: Admitting: Anesthesiology

## 2023-09-17 ENCOUNTER — Ambulatory Visit
Admission: RE | Admit: 2023-09-17 | Discharge: 2023-09-17 | Disposition: A | Discharge status: Home / Self Care | Attending: Gastroenterology | Admitting: Gastroenterology

## 2023-09-17 ENCOUNTER — Encounter
Admission: RE | Disposition: A | Discharge status: Home / Self Care | Payer: Self-pay | Source: Home / Self Care | Attending: Gastroenterology

## 2023-09-17 ENCOUNTER — Encounter: Payer: Self-pay | Admitting: Gastroenterology

## 2023-09-17 DIAGNOSIS — Z8546 Personal history of malignant neoplasm of prostate: Secondary | ICD-10-CM | POA: Diagnosis not present

## 2023-09-17 DIAGNOSIS — K5730 Diverticulosis of large intestine without perforation or abscess without bleeding: Secondary | ICD-10-CM | POA: Insufficient documentation

## 2023-09-17 DIAGNOSIS — I10 Essential (primary) hypertension: Secondary | ICD-10-CM | POA: Diagnosis not present

## 2023-09-17 DIAGNOSIS — Z1211 Encounter for screening for malignant neoplasm of colon: Secondary | ICD-10-CM

## 2023-09-17 DIAGNOSIS — D126 Benign neoplasm of colon, unspecified: Secondary | ICD-10-CM

## 2023-09-17 DIAGNOSIS — F17290 Nicotine dependence, other tobacco product, uncomplicated: Secondary | ICD-10-CM | POA: Diagnosis not present

## 2023-09-17 DIAGNOSIS — K635 Polyp of colon: Secondary | ICD-10-CM | POA: Diagnosis not present

## 2023-09-17 DIAGNOSIS — K219 Gastro-esophageal reflux disease without esophagitis: Secondary | ICD-10-CM | POA: Insufficient documentation

## 2023-09-17 HISTORY — DX: Hematuria, unspecified: R31.9

## 2023-09-17 HISTORY — DX: Trigeminal neuralgia: G50.0

## 2023-09-17 HISTORY — DX: Abdominal aortic aneurysm, without rupture, unspecified: I71.40

## 2023-09-17 SURGERY — COLONOSCOPY
Anesthesia: General

## 2023-09-17 MED ORDER — PROPOFOL 500 MG/50ML IV EMUL
INTRAVENOUS | Status: DC | PRN
  Administered 2023-09-17: 125 ug/kg/min via INTRAVENOUS

## 2023-09-17 MED ORDER — PROPOFOL 10 MG/ML IV BOLUS
INTRAVENOUS | Status: DC | PRN
  Administered 2023-09-17: 70 mg via INTRAVENOUS
  Administered 2023-09-17: 40 mg via INTRAVENOUS
  Administered 2023-09-17: 30 mg via INTRAVENOUS

## 2023-09-17 MED ORDER — SODIUM CHLORIDE 0.9 % IV SOLN: INTRAVENOUS | Status: DC

## 2023-09-17 MED ORDER — LIDOCAINE HCL (PF) 2 % IJ SOLN
INTRAMUSCULAR | Status: AC
  Filled 2023-09-17: qty 5

## 2023-09-17 MED ORDER — LIDOCAINE HCL (CARDIAC) PF 100 MG/5ML IV SOSY
PREFILLED_SYRINGE | INTRAVENOUS | Status: DC | PRN
  Administered 2023-09-17: 80 mg via INTRAVENOUS

## 2023-09-17 NOTE — Anesthesia Preprocedure Evaluation (Signed)
 Anesthesia Evaluation  Patient identified by MRN, date of birth, ID band Patient awake    Reviewed: Allergy & Precautions, NPO status , Patient's Chart, lab work & pertinent test results  History of Anesthesia Complications Negative for: history of anesthetic complications  Airway Mallampati: II  TM Distance: >3 FB Neck ROM: Full    Dental  (+) Poor Dentition, Dental Advidsory Given, Missing   Pulmonary neg shortness of breath, sleep apnea , neg COPD, neg recent URI, Current Smoker and Patient abstained from smoking.   breath sounds clear to auscultation- rhonchi (-) wheezing      Cardiovascular Exercise Tolerance: Good hypertension, Pt. on medications (-) angina (-) CAD, (-) Past MI, (-) Cardiac Stents and (-) CABG + dysrhythmias (palpitations) (-) Valvular Problems/Murmurs Rhythm:Regular Rate:Normal - Systolic murmurs and - Diastolic murmurs    Neuro/Psych neg Seizures negative neurological ROS  negative psych ROS   GI/Hepatic Neg liver ROS,GERD  ,,  Endo/Other  negative endocrine ROSneg diabetes    Renal/GU negative Renal ROS     Musculoskeletal  (+) Arthritis ,    Abdominal  (+) - obese  Peds  Hematology negative hematology ROS (+)   Anesthesia Other Findings Past Medical History: No date: Arthritis No date: Cancer (HCC) No date: Hypertension   Reproductive/Obstetrics                             Anesthesia Physical Anesthesia Plan  ASA: 2  Anesthesia Plan: General   Post-op Pain Management:    Induction: Intravenous  PONV Risk Score and Plan: 1 and Propofol  infusion, TIVA and Treatment may vary due to age or medical condition  Airway Management Planned: Natural Airway and Nasal Cannula  Additional Equipment:   Intra-op Plan:   Post-operative Plan:   Informed Consent: I have reviewed the patients History and Physical, chart, labs and discussed the procedure including  the risks, benefits and alternatives for the proposed anesthesia with the patient or authorized representative who has indicated his/her understanding and acceptance.     Dental advisory given  Plan Discussed with: CRNA and Anesthesiologist  Anesthesia Plan Comments:         Anesthesia Quick Evaluation

## 2023-09-17 NOTE — Op Note (Signed)
 [facility] Gastroenterology Patient Name: Jack Weaver Procedure Date: 09/17/2023 10:08 AM MRN: [id number] Account #: [id number] Date of Birth: [birth year] Admit Type: Outpatient Age: 68 Room: [facility] ENDO ROOM 3 Gender: Male Note Status: Finalized Instrument Name: Charlyn Cooley [id number] Procedure:             Colonoscopy Indications:           Screening for colorectal malignant neoplasm Providers:             Luke Salaam MD, MD Referring MD:          Dori Garbe (Referring MD) Medicines:             Monitored Anesthesia Care Complications:         No immediate complications. Procedure:             Pre-Anesthesia Assessment:                        - Prior to the procedure, a History and Physical was                         performed, and patient medications, allergies and                         sensitivities were reviewed. The patient's tolerance                         of previous anesthesia was reviewed.                        - The risks and benefits of the procedure and the                         sedation options and risks were discussed with the                         patient. All questions were answered and informed                         consent was obtained.                        - ASA Grade Assessment: II - A patient with mild                         systemic disease.                        After obtaining informed consent, the colonoscope was                         passed under direct vision. Throughout the procedure,                         the patient's blood pressure, pulse, and oxygen                         saturations were monitored continuously. The                         Colonoscope was introduced through  the anus and                         advanced to the the cecum, identified by the                         appendiceal orifice. The colonoscopy was performed                         with ease. The patient tolerated the procedure well.                          The quality of the bowel preparation was good. The                         ileocecal valve, appendiceal orifice, and rectum were                         photographed. Findings:      The perianal and digital rectal examinations were normal.      A 3 mm polyp was found in the ascending colon. The polyp was sessile.       The polyp was removed with a cold biopsy forceps. Resection and       retrieval were complete.      Multiple medium-mouthed diverticula were found in the sigmoid colon.      The exam was otherwise without abnormality on direct and retroflexion       views. Impression:            - One 3 mm polyp in the ascending colon, removed with                         a cold biopsy forceps. Resected and retrieved.                        - Diverticulosis in the sigmoid colon.                        - The examination was otherwise normal on direct and                         retroflexion views. Recommendation:        - Discharge patient to home (with escort).                        - Resume previous diet.                        - Continue present medications.                        - Await pathology results.                        - Repeat colonoscopy is not recommended due to current                         age (49 years or older) for surveillance. Procedure Code(s):     --- Professional ---  47829, Colonoscopy, flexible; with biopsy, single or                         multiple Diagnosis Code(s):     --- Professional ---                        Z12.11, Encounter for screening for malignant neoplasm                         of colon                        D12.2, Benign neoplasm of ascending colon                        K57.30, Diverticulosis of large intestine without                         perforation or abscess without bleeding CPT copyright 2022 American Medical Association. All rights reserved. The codes documented in this report are  preliminary and upon coder review may  be revised to meet current compliance requirements. Luke Salaam, MD Luke Salaam MD, MD 09/17/2023 10:37:33 AM This report has been signed electronically. Number of Addenda: 0 Note Initiated On: 09/17/2023 10:08 AM Scope Withdrawal Time: 0 hours 8 minutes 43 seconds  Total Procedure Duration: 0 hours 14 minutes 59 seconds  Estimated Blood Loss:  Estimated blood loss: none.      [facility]

## 2023-09-17 NOTE — H&P (Signed)
 Luke Salaam, MD [address], [place], [place], [address] [address], [address], [place], [place], [address] Phone: [phone]  Fax: [phone]  Primary Care Physician:  [name], MD   Pre-Procedure History & Physical: HPI:  Jack Weaver is a 68 y.o. male is here for an colonoscopy.   Past Medical History:  Diagnosis Date   AAA (abdominal aortic aneurysm) (HCC)    Arthritis    GERD (gastroesophageal reflux disease)    Hematuria    Hypertension    Prostate cancer (HCC)    Sleep apnea    no CPAP   Trigeminal neuralgia     Past Surgical History:  Procedure Laterality Date   APPENDECTOMY     At age 56   CATARACT EXTRACTION W/PHACO Left 08/08/2020   Procedure: CATARACT EXTRACTION PHACO AND INTRAOCULAR LENS PLACEMENT (IOC) LEFT 5.62 01:00.4 903%;  Surgeon: Annell Kidney, MD;  Location: [facility];  Service: Ophthalmology;  Laterality: Left;   HEMORROIDECTOMY  1930   PELVIC LYMPH NODE DISSECTION Bilateral 10/11/2018   Procedure: PELVIC LYMPH NODE DISSECTION;  Surgeon: Dustin Gimenez, MD;  Location: [facility] ORS;  Service: Urology;  Laterality: Bilateral;   QUADRICEPS TENDON REPAIR Left 09/09/2017   Procedure: REPAIR QUADRICEP TENDON;  Surgeon: Marlynn Singer, MD;  Location: [facility] OR[facility];  Service: Orthopedics;  Laterality: Left;   ROBOT ASSISTED LAPAROSCOPIC RADICAL PROSTATECTOMY N/A 10/11/2018   Procedure: ROBOTIC ASSISTED LAPAROSCOPIC RADICAL PROSTATECTOMY;  Surgeon: Dustin Gimenez, MD;  Location: [facility] ORS;  Service: Urology;  Laterality: N/A;    Prior to Admission medications   Medication Sig Start Date End Date Taking? Authorizing Provider  meloxicam  (MOBIC ) 15 MG tablet Take 15 mg by mouth daily.   Yes [provider]  amLODipine  (NORVASC ) 10 MG tablet Take 1 tablet (10 mg total) by mouth daily. 07/07/23   Furth, Cadence H, PA-C  fluticasone  (FLONASE ) 50 MCG/ACT nasal spray  09/30/20   [provider]   gabapentin  (NEURONTIN ) 300 MG capsule gabapentin  300 mg capsule    [provider]  lisinopril  (ZESTRIL ) 40 MG tablet TAKE 1 TABLET EVERY DAY 12/10/22   Simmons-Robinson, Makiera, MD  methocarbamol  (ROBAXIN ) 750 MG tablet Take 750 mg by mouth 2 (two) times daily. 04/04/22   [provider]  metoprolol  succinate (TOPROL -XL) 25 MG 24 hr tablet TAKE 1 TABLET EVERY DAY 06/22/23   Simmons-Robinson, Makiera, MD  Multiple Vitamin (MULTIVITAMIN WITH MINERALS) TABS tablet Take 1 tablet by mouth daily.    [provider]  omeprazole  (PRILOSEC) 40 MG capsule TAKE 1 CAPSULE DAILY AS NEEDED (ACID REFLUX, INDIGESTION) 11/06/22   Simmons-Robinson, Judyann Number, MD    Allergies as of 08/13/2023   (No Known Allergies)    Family History  Problem Relation Age of Onset   Heart attack Mother    Stroke Father    Asthma Father    Cancer Brother    Diabetes Brother    Prostate cancer Neg Hx    Kidney cancer Neg Hx    Bladder Cancer Neg Hx     Social History   Socioeconomic History   Marital status: Married    Spouse name: Not on file   Number of children: Not on file   Years of education: Not on file   Highest education level: Not on file  Occupational History   Not on file  Tobacco Use   Smoking status: Some Days    Types: Cigars   Smokeless tobacco: Never  Vaping Use  Vaping status: Never Used  Substance and Sexual Activity   Alcohol use: Yes    Alcohol/week: 2.0 standard drinks of alcohol    Types: 2 Cans of beer per week    Comment: occasionally   Drug use: No   Sexual activity: Not on file  Other Topics Concern   Not on file  Social History Narrative   Not on file   Social Drivers of Health   Financial Resource Strain: Low Risk  (09/23/2022)   Overall Financial Resource Strain (CARDIA)    Difficulty of Paying Living Expenses: Not hard at all  Food Insecurity: No Food Insecurity (09/23/2022)   Hunger Vital Sign    Worried About Running Out of Food in the  Last Year: Never true    Ran Out of Food in the Last Year: Never true  Transportation Needs: No Transportation Needs (09/23/2022)   PRAPARE - Administrator, Civil Service (Medical): No    Lack of Transportation (Non-Medical): No  Physical Activity: Sufficiently Active (09/23/2022)   Exercise Vital Sign    Days of Exercise per Week: 6 days    Minutes of Exercise per Session: 70 min  Stress: No Stress Concern Present (09/23/2022)   [employer] of Occupational Health - Occupational Stress Questionnaire    Feeling of Stress : Not at all  Social Connections: Moderately Integrated (09/23/2022)   Social Connection and Isolation Panel [NHANES]    Frequency of Communication with Friends and Family: More than three times a week    Frequency of Social Gatherings with Friends and Family: Twice a week    Attends Religious Services: 1 to 4 times per year    Active Member of [employer] or Organizations: No    Attends Banker Meetings: Never    Marital Status: Married  Catering manager Violence: Not At Risk (09/23/2022)   Humiliation, Afraid, Rape, and Kick questionnaire    Fear of Current or Ex-Partner: No    Emotionally Abused: No    Physically Abused: No    Sexually Abused: No    Review of Systems: See HPI, otherwise negative ROS  Physical Exam: There were no vitals taken for this visit. General:   Alert,  pleasant and cooperative in NAD Head:  Normocephalic and atraumatic. Neck:  Supple; no masses or thyromegaly. Lungs:  Clear throughout to auscultation, normal respiratory effort.    Heart:  +S1, +S2, Regular rate and rhythm, No edema. Abdomen:  Soft, nontender and nondistended. Normal bowel sounds, without guarding, and without rebound.   Neurologic:  Alert and  oriented x4;  grossly normal neurologically.  Impression/Plan: Jack Weaver is here for an colonoscopy to be performed for Screening colonoscopy average risk   Risks, benefits, limitations, and  alternatives regarding  colonoscopy have been reviewed with the patient.  Questions have been answered.  All parties agreeable.   Luke Salaam, MD  09/17/2023, 9:39 AM

## 2023-09-17 NOTE — Transfer of Care (Signed)
 Immediate Anesthesia Transfer of Care Note  Patient: TALAL FRITCHMAN  Procedure(s) Performed: COLONOSCOPY  Patient Location: PACU and Endoscopy Unit  Anesthesia Type:General  Level of Consciousness: sedated  Airway & Oxygen Therapy: Patient Spontanous Breathing  Post-op Assessment: Report given to RN and Post -op Vital signs reviewed and stable  Post vital signs: Reviewed and stable  Last Vitals:  Vitals Value Taken Time  BP 116/71 09/17/23 1038  Temp 35.7 C 09/17/23 1037  Pulse 58 09/17/23 1038  Resp 20 09/17/23 1038  SpO2 99 % 09/17/23 1038  Vitals shown include unfiled device data.  Last Pain:  Vitals:   09/17/23 1037  TempSrc: Temporal  PainSc: Asleep         Complications: No notable events documented.

## 2023-09-18 ENCOUNTER — Encounter: Payer: Self-pay | Admitting: Gastroenterology

## 2023-09-18 LAB — SURGICAL PATHOLOGY

## 2023-09-22 ENCOUNTER — Encounter: Payer: Self-pay | Admitting: Gastroenterology

## 2023-09-22 NOTE — Anesthesia Postprocedure Evaluation (Signed)
 Anesthesia Post Note  Patient: Jack Weaver  Procedure(s) Performed: COLONOSCOPY  Patient location during evaluation: Endoscopy Anesthesia Type: General Level of consciousness: awake and alert Pain management: pain level controlled Vital Signs Assessment: post-procedure vital signs reviewed and stable Respiratory status: spontaneous breathing, nonlabored ventilation, respiratory function stable and patient connected to nasal cannula oxygen Cardiovascular status: blood pressure returned to baseline and stable Postop Assessment: no apparent nausea or vomiting Anesthetic complications: no   No notable events documented.   Last Vitals:  Vitals:   09/17/23 0941 09/17/23 1037  BP: (!) 160/95 116/71  Pulse: (!) 56 (!) 56  Resp: 17 20  Temp: (!) 35.9 C (!) 35.7 C  SpO2: 100% 100%    Last Pain:  Vitals:   09/17/23 1057  TempSrc:   PainSc: 0-No pain                 Vanice Genre

## 2023-09-23 ENCOUNTER — Other Ambulatory Visit: Payer: Self-pay | Admitting: Family Medicine

## 2023-09-23 DIAGNOSIS — K2970 Gastritis, unspecified, without bleeding: Secondary | ICD-10-CM

## 2023-09-26 NOTE — Telephone Encounter (Signed)
 Too soon for refill, last refill 11/06/22 for 90 and 3 RF. OV needed for additional refills.  Requested Prescriptions  Pending Prescriptions Disp Refills   omeprazole  (PRILOSEC) 40 MG capsule [Pharmacy Med Name: Omeprazole  Oral Capsule Delayed Release 40 MG] 90 capsule 3    Sig: TAKE 1 CAPSULE DAILY AS NEEDED (ACID REFLUX, INDIGESTION)     Gastroenterology: Proton Pump Inhibitors Failed - 09/26/2023  8:12 AM      Failed - Valid encounter within last 12 months    Recent Outpatient Visits   None     Future Appointments             In 3 weeks Dustin Gimenez, MD [facility] Urology [place]

## 2023-09-30 ENCOUNTER — Other Ambulatory Visit: Payer: Self-pay | Admitting: Family Medicine

## 2023-09-30 ENCOUNTER — Ambulatory Visit: Admitting: Urology

## 2023-09-30 VITALS — BP 129/81 | HR 61 | Ht 72.0 in | Wt 230.0 lb

## 2023-09-30 DIAGNOSIS — C61 Malignant neoplasm of prostate: Secondary | ICD-10-CM | POA: Diagnosis not present

## 2023-09-30 DIAGNOSIS — I10 Essential (primary) hypertension: Secondary | ICD-10-CM

## 2023-09-30 DIAGNOSIS — R3129 Other microscopic hematuria: Secondary | ICD-10-CM

## 2023-09-30 MED ORDER — LEUPROLIDE ACETATE (6 MONTH) 45 MG ~~LOC~~ KIT
45.0000 mg | PACK | Freq: Once | SUBCUTANEOUS | Status: AC
  Administered 2023-09-30: 45 mg via SUBCUTANEOUS

## 2023-09-30 NOTE — Progress Notes (Signed)
 Eligard  SubQ Injection    Due to Prostate Cancer patient is present today for a Eligard  Injection.   Medication: Eligard  6 month Dose: 45 mg  Location: LUOQ Lot: [id number] Exp: 12/2024   Patient tolerated well, no complications were noted   Performed by: Delane Fear RMA   Per Dr. Ace Holder patient is to continue therapy for indefinitely. Patient's next follow up was scheduled for 03/22/2024. This appointment was scheduled using wheel and given to patient today along with reminder continue on Vitamin D  800-1000iu and Calcium 1000-1200mg  daily while on Androgen Deprivation Therapy.  PA approval dates: NO PA required

## 2023-09-30 NOTE — Progress Notes (Signed)
 Elfrieda Grise Plume,acting as a scribe for Dustin Gimenez, MD.,have documented all relevant documentation on the behalf of Dustin Gimenez, MD,as directed by  Dustin Gimenez, MD while in the presence of Dustin Gimenez, MD.  09/30/23 12:25 PM   Jack Weaver 12-Dec-1955 [id number]  Referring provider: [name], MD [address] [place],  [place] [address]  Chief Complaint  Patient presents with   Prostate Cancer    HPI:  68 year old male with a personal history of prostate cancer presents today for a six-month follow-up.   He underwent radical prostatectomy non-nerve sparing with bilateral pelvic lymph node dissection on 10/01/2018.  Surgical pathology was consistent with Gleason 3+5 disease, pT3a N1.  He was noted to have extraprostatic extension with positive posterior margins both left and right.  He is also noted to have 2 of 3 lymph nodes on the left with focal metastatic disease.  0 of 3 lymph nodes on the right.  No bladder neck involvement.  No seminal vesicle involvement.   He underwent staging axumin  PET scan on 11/08/2018 that was negative for metastatic disease and was started on Lupron . He declined whole pelvic radiation.    Testosterone indicated he did not achieve castrate levels in 2021 and a second dose of ADT was administered.  His PSA had risen to 0.7 In 2021.    He did not tolerate apalutamide  as additional adjuvant treatment to his ADT.    He has been on Eligard  for prostate cancer management, and his PSA remains undetectable as of 09/09/2023.   He reports doing well overall, except for experiencing sciatica. He mentions having difficulty keeping warm, particularly in cooler weather, which he attributes to the medication.  PMH: Past Medical History:  Diagnosis Date   AAA (abdominal aortic aneurysm) (HCC)    Arthritis    GERD (gastroesophageal reflux disease)    Hematuria    Hypertension    Prostate cancer (HCC)    Sleep apnea     no CPAP   Trigeminal neuralgia     Surgical History: Past Surgical History:  Procedure Laterality Date   APPENDECTOMY     At age 30   CATARACT EXTRACTION W/PHACO Left 08/08/2020   Procedure: CATARACT EXTRACTION PHACO AND INTRAOCULAR LENS PLACEMENT (IOC) LEFT 5.62 01:00.4 903%;  Surgeon: Annell Kidney, MD;  Location: [facility];  Service: Ophthalmology;  Laterality: Left;   COLONOSCOPY N/A 09/17/2023   Procedure: COLONOSCOPY;  Surgeon: Luke Salaam, MD;  Location: [facility];  Service: Gastroenterology;  Laterality: N/A;   HEMORROIDECTOMY  1930   PELVIC LYMPH NODE DISSECTION Bilateral 10/11/2018   Procedure: PELVIC LYMPH NODE DISSECTION;  Surgeon: Dustin Gimenez, MD;  Location: [facility];  Service: Urology;  Laterality: Bilateral;   QUADRICEPS TENDON REPAIR Left 09/09/2017   Procedure: REPAIR QUADRICEP TENDON;  Surgeon: Marlynn Singer, MD;  Location: [facility];  Service: Orthopedics;  Laterality: Left;   ROBOT ASSISTED LAPAROSCOPIC RADICAL PROSTATECTOMY N/A 10/11/2018   Procedure: ROBOTIC ASSISTED LAPAROSCOPIC RADICAL PROSTATECTOMY;  Surgeon: Dustin Gimenez, MD;  Location: [facility];  Service: Urology;  Laterality: N/A;    Home Medications:  Allergies as of 09/30/2023   No Known Allergies      Medication List        Accurate as of Sep 30, 2023 12:25 PM. If you have any questions, ask your nurse or doctor.          STOP taking these medications    methocarbamol  750 MG tablet Commonly known as:  ROBAXIN  Stopped by: Dustin Gimenez       TAKE these medications    amLODipine  10 MG tablet Commonly known as: NORVASC  Take 1 tablet (10 mg total) by mouth daily.   fluticasone  50 MCG/ACT nasal spray Commonly known as: FLONASE    gabapentin  300 MG capsule Commonly known as: NEURONTIN  gabapentin  300 mg capsule   lisinopril  40 MG tablet Commonly known as: ZESTRIL  TAKE 1 TABLET EVERY DAY   meloxicam  15 MG tablet Commonly known as: MOBIC  Take 15 mg by mouth  daily.   metoprolol  succinate 25 MG 24 hr tablet Commonly known as: TOPROL -XL TAKE 1 TABLET EVERY DAY   multivitamin with minerals Tabs tablet Take 1 tablet by mouth daily.   omeprazole  40 MG capsule Commonly known as: PRILOSEC TAKE 1 CAPSULE DAILY AS NEEDED (ACID REFLUX, INDIGESTION)        Family History: Family History  Problem Relation Age of Onset   Heart attack Mother    Stroke Father    Asthma Father    Cancer Brother    Diabetes Brother    Prostate cancer Neg Hx    Kidney cancer Neg Hx    Bladder Cancer Neg Hx     Social History:  reports that he has been smoking cigars. He has never used smokeless tobacco. He reports current alcohol use of about 2.0 standard drinks of alcohol per week. He reports that he does not use drugs.   Physical Exam: BP 129/81   Pulse 61   Ht 6' (1.829 m)   Wt 230 lb (104.3 kg)   BMI 31.19 kg/m   Constitutional:  Alert and oriented, No acute distress. HEENT: Central AT, moist mucus membranes.  Trachea midline, no masses. Neurologic: Grossly intact, no focal deficits, moving all 4 extremities. Psychiatric: Normal mood and affect.   Assessment & Plan:    1. Prostate Cancer - His prostate cancer remains undetectable on ADT- biochemical recurrence after prostatectomy with N1 diease, declined XRT managed on ADT only (declined additional oral therapy) - He is currently on Eligard , and he reports having adapted to the treatment, although he experiences difficulty keeping warm. - -  - Continue Eligard  therapy as it is effectively managing the prostate cancer.  - Eligard  injection today - Encouraged him to maintain regular exercise and focus on bone health, including calcium supplementation.  2. Microscopic Hematuria - Previous evaluation for microscopic hematuria was unremarkable.  - No further action is required at this time unless new symptoms arise.  Return in about 6 months (around 04/01/2024) for PA visit for follow up and repeat  PSA.  I have reviewed the above documentation for accuracy and completeness, and I agree with the above.   Dustin Gimenez, MD    [facility] [address] [place], [place] [address] [phone]

## 2023-10-09 ENCOUNTER — Other Ambulatory Visit: Payer: Self-pay | Admitting: Family Medicine

## 2023-10-09 DIAGNOSIS — I10 Essential (primary) hypertension: Secondary | ICD-10-CM

## 2023-10-20 ENCOUNTER — Ambulatory Visit: Admitting: Urology

## 2023-10-20 ENCOUNTER — Ambulatory Visit: Payer: Self-pay

## 2023-10-20 NOTE — Progress Notes (Signed)
 "  BP 136/82   Pulse 68   Temp 98.2 F (36.8 C) (Oral)   Ht 6' (1.829 m)   Wt 230 lb 1.6 oz (104.4 kg)   SpO2 100%   BMI 31.21 kg/m    Subjective:    Patient ID: Jack Weaver, male    DOB: 08-25-1955, 69 y.o.   MRN: [id number]  HPI: Jack Weaver is a 68 y.o. male  Chief Complaint  Patient presents with   Neck Pain    R side, onset over a year, worsening and radiating to back of head    Leg Pain    Cramping after long periods of movement/ standing. Patient says he read that lisinopril  can cause leg cramps and wants to know if that could cause this    Discussed the use of AI scribe software for clinical note transcription with the patient, who gave verbal consent to proceed.  History of Present Illness Jack Weaver is a 68 year old male with hypertension who presents with right-sided neck pain and leg cramps.  He has been experiencing right-sided neck pain for over a year, which has progressively worsened and now radiates to the back of his head. The pain is persistent and exacerbated by movement. He has tried using a lidocaine  patch, which provides some relief, and is currently taking meloxicam , which helps to some extent. Methocarbamol, a muscle relaxer, was initially effective but no longer provides relief. An x-ray indicated arthritis in his neck. Physical therapy has not been helpful as it worsens his symptoms. He has not had any recent imaging studies like an MRI.  He also experiences leg cramps, primarily in the right leg, for about six months. The cramps occur after prolonged walking or standing and are severe enough to feel like his foot might break. He uses a muscle cramp cream before and after walking, which sometimes helps. He has a history of knee surgery on the affected leg and suspects that lisinopril , which he has been taking for years, might be contributing to the cramps. He has not had recent blood work to check for electrolyte imbalances.  His current medication  regimen for hypertension includes amlodipine  10 mg daily, lisinopril  40 mg daily, and metoprolol  25 mg daily.         10/21/2023    9:00 AM 09/23/2022    2:40 PM 07/24/2022    2:05 PM  Depression screen PHQ 2/9  Decreased Interest 0 0 0  Down, Depressed, Hopeless 0 0 0  PHQ - 2 Score 0 0 0  Altered sleeping 0    Tired, decreased energy 0    Change in appetite 0    Feeling bad or failure about yourself  0    Trouble concentrating 0    Moving slowly or fidgety/restless 0    Suicidal thoughts 0    PHQ-9 Score 0    Difficult doing work/chores Not difficult at all      Relevant past medical, surgical, family and social history reviewed and updated as indicated. Interim medical history since our last visit reviewed. Allergies and medications reviewed and updated.  Review of Systems  Ten systems reviewed and is negative except as mentioned in HPI      Objective:      BP 136/82   Pulse 68   Temp 98.2 F (36.8 C) (Oral)   Ht 6' (1.829 m)   Wt 230 lb 1.6 oz (104.4 kg)   SpO2 100%   BMI 31.21  kg/m    Wt Readings from Last 3 Encounters:  10/21/23 230 lb 1.6 oz (104.4 kg)  09/30/23 230 lb (104.3 kg)  09/17/23 220 lb (99.8 kg)    Physical Exam Vitals reviewed.  Constitutional:      Appearance: Normal appearance.  HENT:     Head: Normocephalic.  Cardiovascular:     Rate and Rhythm: Normal rate and regular rhythm.  Pulmonary:     Effort: Pulmonary effort is normal.     Breath sounds: Normal breath sounds.  Musculoskeletal:        General: Normal range of motion.  Skin:    General: Skin is warm and dry.  Neurological:     General: No focal deficit present.     Mental Status: He is alert and oriented to person, place, and time. Mental status is at baseline.  Psychiatric:        Mood and Affect: Mood normal.        Behavior: Behavior normal.        Thought Content: Thought content normal.        Judgment: Judgment normal.        Results for orders placed or  performed during the hospital encounter of 09/17/23  Surgical pathology   Collection Time: 09/17/23 12:00 AM  Result Value Ref Range   SURGICAL PATHOLOGY      SURGICAL PATHOLOGY [facility] [address], [address] [place], [place] [address] Telephone [phone] or [phone] Fax [phone]  REPORT OF SURGICAL PATHOLOGY   Accession #: [id number] Patient Name: Jack Weaver, Jack Weaver Visit # : [id number]  MRN: [id number] Physician: Therisa Bi DOB/Age 01-28-1956 (Age: 39) Gender: M Collected Date: 09/17/2023 Received Date: 09/17/2023  FINAL DIAGNOSIS       1. Ascending  Colon Polyp, cbx :       - COLONIC MUCOSA WITH PROMINENT LYMPHOID AGGREGATE AND FOCAL HYPERPLASTIC      EPITHELIAL CHANGE.      - NEGATIVE FOR DYSPLASIA AND MALIGNANCY.       DATE SIGNED OUT: 09/18/2023 ELECTRONIC SIGNATURE : Janel Md, Rexene , Pathologist, Electronic Signature  MICROSCOPIC DESCRIPTION  CASE COMMENTS STAINS USED IN DIAGNOSIS: H&E    CLINICAL HISTORY  SPECIMEN(S) OBTAINED 1. Ascending  Colon Polyp, Cbx  SPECIMEN COMMENTS: SPECIMEN CLINICAL INFORMATION: 1. Screening colonoscopy    Gross Descripti on 1. CBX polyp ascending colon, received in formalin is a single 0.5 x 0.4 x 0.1 cm tan-gray tissue fragment. The specimen is submitted in toto in 1 block (1A).      AMG 09/17/2023        Report signed out from the following location(s) [facility] [address] [place], [place] [address] CLIA #: [id number]  [facility] [address] [place], [place] [address] CLIA #: [id number]           Assessment & Plan:   Problem List Items Addressed This Visit       Cardiovascular and Mediastinum   Essential hypertension   Relevant Medications   losartan  (COZAAR ) 50 MG tablet   Other Visit Diagnoses       Leg cramps    -  Primary   Relevant Orders   Comprehensive metabolic panel with GFR   Magnesium      Neck pain       Relevant Medications   tiZANidine  (ZANAFLEX ) 4 MG tablet        Assessment and Plan Assessment & Plan Cervical  spondylosis Chronic right-sided neck pain for over a year, worsening and radiating to the back of the head. Imaging suggests arthritis. Current treatment with meloxicam  and lidocaine  provides some relief. Methocarbamol was previously beneficial but is no longer effective. Celebrex was ineffective. Physical therapy is not preferred due to worsening symptoms. Injections in the neck were considered too early by previous providers. The condition is expected to worsen without intervention, with potential treatments including steroid injections or surgery depending on severity. - Prescribe a different muscle relaxant tizanidine  with dosing instructions of half a tablet to one and a half tablets as needed for pain. - Advise follow-up with orthopedic specialist for further evaluation and management options, including potential steroid injections   Essential hypertension Blood pressure is elevated at 142/88 mmHg. Current medications include amlodipine , lisinopril , and metoprolol . Lisinopril  is suspected to contribute to leg cramps. Switching to losartan  is planned to address this issue. - Discontinue lisinopril . - Prescribe losartan  as a replacement for lisinopril . - Continue amlodipine  and metoprolol . - Advise follow-up with Dr. Lang to monitor blood pressure control after medication change.  Leg cramps Leg cramps occur after prolonged movement and standing, primarily affecting the right leg, with cramping in the front of the leg and pulling sensation in the foot. Lisinopril  is suspected to be a contributing factor. Blood work is needed to rule out electrolyte imbalances. - Order blood work to assess sodium, potassium, and other potential underlying causes of leg cramps. - Discontinue lisinopril  and switch to losartan  to address potential medication-related  cramping.        Follow up plan: Return in about 2 weeks (around 11/04/2023) for follow up with pcp.      "

## 2023-10-20 NOTE — Telephone Encounter (Signed)
 Appt made at [facility] for 10/21/2023 at 9am

## 2023-10-20 NOTE — Telephone Encounter (Signed)
 Chief Complaint: Headache Symptoms: Neck pain Frequency: yesterday evening, "all night" Pertinent Negatives: Patient denies vision changes, N/V, weakness, numbness, changes in speech Disposition: [] ED /[] Urgent Care (no appt availability in office) / [] Appointment(In office/virtual)/ []  [place] [facility] [facility]/ [] Home Care/ [] Refused Recommended Disposition /[] [place] [facility]/ [x]  Follow-up with PCP Additional Notes: Pt reports he has been experiencing chronic neck pain x 1 year, previously diagnosed as arthritis. Pt notes yesterday evening he developed a severe HA with referred pain up the back of his head from neck. Pt notes it kept him up much of the night but has since begun to resolve since taking OTC Motrin . Pt is requesting appt for possible BP medication adjustments. This RN educated pt on home care, new-worsening symptoms, when to call back/seek emergent care. Pt verbalized understanding and agrees to plan.     Copied from CRM [id number]. Topic: Clinical - Red Word Triage >> Oct 20, 2023  9:19 AM Tiffany B wrote: Red Word that prompted transfer to Nurse Triage: Patient experiencing severe neck and head pain but last night it was throbbing and patient  was unable to sleep due to pain. Reason for Disposition  Headache  Answer Assessment - Initial Assessment Questions 1. LOCATION: "Where does it hurt?"      Still there "just a little" back of head 2. ONSET: "When did the headache start?" (Minutes, hours or days)      Neck pain x 1 year, shooting pain into head was new last night 3. PATTERN: "Does the pain come and go, or has it been constant since it started?"     Resolving, intermittent 4. SEVERITY: "How bad is the pain?" and "What does it keep you from doing?"  (e.g., Scale 1-10; mild, moderate, or severe)   - MILD (1-3): doesn't interfere with normal activities    - MODERATE (4-7): interferes with normal activities or awakens from sleep    - SEVERE (8-10): excruciating pain,  unable to do any normal activities        5/10 in neck, no pain in head 5. RECURRENT SYMPTOM: "Have you ever had headaches before?" If Yes, ask: "When was the last time?" and "What happened that time?"      No 6. CAUSE: "What do you think is causing the headache?"     Unknown  8. HEAD INJURY: "Has there been any recent injury to the head?"      None 9. OTHER SYMPTOMS: "Do you have any other symptoms?" (fever, stiff neck, eye pain, sore throat, cold symptoms)     Neck pain, chronic  Protocols used: Headache-A-AH

## 2023-10-21 ENCOUNTER — Encounter: Payer: Self-pay | Admitting: Nurse Practitioner

## 2023-10-21 ENCOUNTER — Ambulatory Visit (INDEPENDENT_AMBULATORY_CARE_PROVIDER_SITE_OTHER): Admitting: Nurse Practitioner

## 2023-10-21 VITALS — BP 136/82 | HR 68 | Temp 98.2°F | Ht 72.0 in | Wt 230.1 lb

## 2023-10-21 DIAGNOSIS — I10 Essential (primary) hypertension: Secondary | ICD-10-CM | POA: Diagnosis not present

## 2023-10-21 DIAGNOSIS — M542 Cervicalgia: Secondary | ICD-10-CM | POA: Diagnosis not present

## 2023-10-21 DIAGNOSIS — R252 Cramp and spasm: Secondary | ICD-10-CM | POA: Diagnosis not present

## 2023-10-21 MED ORDER — LOSARTAN POTASSIUM 50 MG PO TABS: 50.0000 mg | ORAL_TABLET | Freq: Every day | ORAL | 0 refills | Status: DC

## 2023-10-21 MED ORDER — TIZANIDINE HCL 4 MG PO TABS: 2.0000 mg | ORAL_TABLET | Freq: Three times a day (TID) | ORAL | 2 refills | Status: DC | PRN

## 2023-10-22 ENCOUNTER — Ambulatory Visit: Payer: Self-pay | Admitting: Nurse Practitioner

## 2023-10-22 LAB — COMPREHENSIVE METABOLIC PANEL WITH GFR
AG Ratio: 1.9 (calc) (ref 1.0–2.5)
ALT: 16 U/L (ref 9–46)
AST: 18 U/L (ref 10–35)
Albumin: 4.4 g/dL (ref 3.6–5.1)
Alkaline phosphatase (APISO): 127 U/L (ref 35–144)
BUN: 21 mg/dL (ref 7–25)
CO2: 27 mmol/L (ref 20–32)
Calcium: 9.6 mg/dL (ref 8.6–10.3)
Chloride: 109 mmol/L (ref 98–110)
Creat: 1.11 mg/dL (ref 0.70–1.28)
Globulin: 2.3 g/dL (ref 1.9–3.7)
Glucose, Bld: 103 mg/dL — ABNORMAL HIGH (ref 65–99)
Potassium: 4 mmol/L (ref 3.5–5.3)
Sodium: 142 mmol/L (ref 135–146)
Total Bilirubin: 0.3 mg/dL (ref 0.2–1.2)
Total Protein: 6.7 g/dL (ref 6.1–8.1)
eGFR: 71 mL/min/{1.73_m2} (ref >=60)

## 2023-10-22 LAB — MAGNESIUM: Magnesium: 2 mg/dL (ref 1.5–2.5)

## 2023-11-10 ENCOUNTER — Other Ambulatory Visit: Payer: Self-pay | Admitting: Family Medicine

## 2023-11-10 DIAGNOSIS — I10 Essential (primary) hypertension: Secondary | ICD-10-CM

## 2023-11-10 NOTE — Telephone Encounter (Signed)
 [facility] is asking for refills on Lisinopril  40 mg.   I do not see this on his list of meds

## 2023-11-10 NOTE — Telephone Encounter (Signed)
 LOV 9*12*24 NOV [id number] LRF [id number] LABS 3*24*40

## 2023-11-10 NOTE — Addendum Note (Signed)
 Addended by: Bart Lieu on: 11/10/2023 11:02 AM   Modules accepted: Orders

## 2023-11-11 MED ORDER — LISINOPRIL 40 MG PO TABS: 40.0000 mg | ORAL_TABLET | Freq: Every day | ORAL | 0 refills | Status: DC

## 2023-11-11 NOTE — Telephone Encounter (Signed)
 Called and spoke to the pt, he stated he was seen downstairs at [facility] the provider started him on Losartan , he stated it made is Bp go up so he stopped taking it and started back taking lisinopril , which is why he requested the medication please advise

## 2023-11-11 NOTE — Addendum Note (Signed)
 Addended by: SIMMONS-ROBINSON, Renny Gunnarson L on: 11/11/2023 02:48 PM   Modules accepted: Orders

## 2023-11-13 ENCOUNTER — Telehealth: Payer: Self-pay

## 2023-11-13 NOTE — Telephone Encounter (Signed)
 Copied from CRM [id number]. Topic: Clinical - Medication Question >> Nov 12, 2023  8:48 AM Lizabeth Riggs wrote: Reason for CRM:  Josealberto has a question about lisinopril  (ZESTRIL ) 40 MG tablet medication. Does he need to continue to take lisinopril  (ZESTRIL ) 40 MG tablet and does he need to take tiZANidine  (ZANAFLEX ) 4 MG tablet? Please call Jadon at [phone] to discuss medications. Thanks

## 2023-11-17 NOTE — Telephone Encounter (Signed)
 Reviewed. Pt to continue lisinopril  40mg  daily for blood pressure management with goal of less than 140/90 BP. If BP elevated above goal consistently with discussed method of measuring BP,please schedule for next available visit to adjust medication regimen

## 2023-11-18 NOTE — Telephone Encounter (Signed)
 Previous messages have stated that losartan  made his BP become elevated and so he started back on lisinopril .   I would to have a virtual appt with Jack Weaver to review the medications if possible in the next 1-3 weeks.   Please double book an 8a or 1p slot  to focus on the blood pressure management and get the correct medications ordered

## 2023-11-18 NOTE — Telephone Encounter (Signed)
 Scheduled 07/16/ @ 1:00

## 2023-11-21 ENCOUNTER — Other Ambulatory Visit: Payer: Self-pay | Admitting: Nurse Practitioner

## 2023-11-21 DIAGNOSIS — I10 Essential (primary) hypertension: Secondary | ICD-10-CM

## 2023-11-24 ENCOUNTER — Telehealth: Payer: Self-pay | Admitting: Family Medicine

## 2023-11-24 NOTE — Telephone Encounter (Signed)
 Copied from CRM [id number]. Topic: Clinical - Prescription Issue >> Nov 24, 2023  3:24 PM Essie A wrote: Reason for CRM: Patient's pcp is Dr. Sharma.  She prescribed lisinopril  (ZESTRIL ) 40 MG tablet for patient.  He stopped taking this because it gave him bad leg cramps.  He saw NP Mliss Spray on 10/21/23 and she prescribed losartan  (COZAAR ) 50 MG tablet.  He has a weeks work left.  His pharmacy denied the prescription because he needs to get it from the prescriber which is Mliss Spray.  Please let the patient know if Dr. Sharma can take over the prescription for Losartan  by calling him [phone].

## 2023-11-24 NOTE — Telephone Encounter (Signed)
 noted

## 2023-11-24 NOTE — Telephone Encounter (Signed)
 Patient called and advised of Dr. Feliberto message to schedule a visit to discuss medications, advised an available appointment on tomorrow. He says he can come tomorrow because he will be out of medication soon. Advised I will cancel the one on 12/09/23 so he can come tomorrow instead. He verbalized understanding.

## 2023-11-24 NOTE — Telephone Encounter (Signed)
 Unable to refill per protocol, Rx discontinued on 11/11/23 due to side effects.  Requested Prescriptions  Pending Prescriptions Disp Refills   losartan  (COZAAR ) 50 MG tablet [Pharmacy Med Name: LOSARTAN  POTASSIUM 50 MG TAB] 30 tablet 0    Sig: TAKE 1 TABLET BY MOUTH EVERY DAY     Cardiovascular:  Angiotensin Receptor Blockers Failed - 11/24/2023  8:17 AM      Failed - Valid encounter within last 6 months    Recent Outpatient Visits           1 month ago Leg cramps   [facility] Gareth Mliss FALCON, FNP       Future Appointments             In 3 months Maurine Lukes, PA-C [facility] [place]            Passed - Cr in normal range and within 180 days    Creat  Date Value Ref Range Status  10/21/2023 1.11 0.70 - 1.28 mg/dL Final         Passed - K in normal range and within 180 days    Potassium  Date Value Ref Range Status  10/21/2023 4.0 3.5 - 5.3 mmol/L Final  06/22/2011 3.6 3.5 - 5.1 mmol/L Final         Passed - Patient is not pregnant      Passed - Last BP in normal range    BP Readings from Last 1 Encounters:  10/21/23 136/82

## 2023-11-25 ENCOUNTER — Telehealth: Payer: Self-pay | Admitting: Family Medicine

## 2023-11-25 ENCOUNTER — Encounter: Payer: Self-pay | Admitting: Family Medicine

## 2023-11-25 ENCOUNTER — Ambulatory Visit (INDEPENDENT_AMBULATORY_CARE_PROVIDER_SITE_OTHER): Admitting: Family Medicine

## 2023-11-25 VITALS — BP 137/80 | HR 56 | Ht 72.0 in | Wt 233.0 lb

## 2023-11-25 DIAGNOSIS — Z5321 Procedure and treatment not carried out due to patient leaving prior to being seen by health care provider: Secondary | ICD-10-CM

## 2023-11-25 NOTE — Telephone Encounter (Signed)
 error

## 2023-11-25 NOTE — Telephone Encounter (Signed)
 Patient left appt today without being seen stated that he had somewhere else he was pose to be @ 3pm so checkin of the appt was cancelled and he was rescheduled for 12/14/23. He is still in need of a medication refill BP medication but since he did not stay at appt and be seen by provider prescription was not filled. Patient will need to wait until that rescheduled appt on 7/21 be receiving next refill as stated by the provider

## 2023-11-26 ENCOUNTER — Telehealth: Payer: Self-pay

## 2023-11-26 MED ORDER — LOSARTAN POTASSIUM 50 MG PO TABS: 50.0000 mg | ORAL_TABLET | Freq: Every day | ORAL | 0 refills | Status: DC

## 2023-11-26 NOTE — Telephone Encounter (Signed)
 Copied from CRM [id number]. Topic: Clinical - Prescription Issue >> Nov 26, 2023  2:28 PM Tiffini S wrote: Reason for CRM: Chrissie [phone] with [facility] called for Mliss Spray. The  patient is requesting a medication refill for losartan  50mg .

## 2023-11-26 NOTE — Progress Notes (Signed)
 Pt arrived and left prior to my evaluation due to wait time and a prior appt scheduled for 3PM

## 2023-11-26 NOTE — Telephone Encounter (Signed)
 LOSARTAN  50MG  SENT TO [facility]

## 2023-11-26 NOTE — Addendum Note (Signed)
 Addended by: SIMMONS-ROBINSON, Amen Staszak L on: 11/26/2023 04:48 PM   Modules accepted: Orders

## 2023-12-09 ENCOUNTER — Ambulatory Visit: Admitting: Family Medicine

## 2023-12-14 ENCOUNTER — Ambulatory Visit: Admitting: Family Medicine

## 2023-12-20 ENCOUNTER — Other Ambulatory Visit: Payer: Self-pay | Admitting: Nurse Practitioner

## 2023-12-22 NOTE — Telephone Encounter (Signed)
 Requested Prescriptions  Pending Prescriptions Disp Refills   losartan  (COZAAR ) 50 MG tablet [Pharmacy Med Name: LOSARTAN  POTASSIUM 50 MG TAB] 30 tablet 0    Sig: TAKE 1 TABLET BY MOUTH EVERY DAY     Cardiovascular:  Angiotensin Receptor Blockers Failed - 12/22/2023 10:21 AM      Failed - Valid encounter within last 6 months    Recent Outpatient Visits           3 weeks ago Patient left without being seen   [facility], [place], MD   2 months ago Leg cramps   [facility] Gareth Clarity F, FNP       Future Appointments             In 3 months Maurine Lukes, PA-C [facility] [place]            Passed - Cr in normal range and within 180 days    Creat  Date Value Ref Range Status  10/21/2023 1.11 0.70 - 1.28 mg/dL Final         Passed - K in normal range and within 180 days    Potassium  Date Value Ref Range Status  10/21/2023 4.0 3.5 - 5.3 mmol/L Final  06/22/2011 3.6 3.5 - 5.1 mmol/L Final         Passed - Patient is not pregnant      Passed - Last BP in normal range    BP Readings from Last 1 Encounters:  11/25/23 137/80

## 2024-01-01 ENCOUNTER — Ambulatory Visit: Admitting: Family Medicine

## 2024-01-05 ENCOUNTER — Ambulatory Visit: Admitting: Family Medicine

## 2024-01-05 ENCOUNTER — Encounter: Payer: Self-pay | Admitting: Family Medicine

## 2024-01-05 VITALS — BP 131/74 | Resp 16 | Ht 72.0 in | Wt 227.0 lb

## 2024-01-05 DIAGNOSIS — I10 Essential (primary) hypertension: Secondary | ICD-10-CM

## 2024-01-05 DIAGNOSIS — K219 Gastro-esophageal reflux disease without esophagitis: Secondary | ICD-10-CM | POA: Diagnosis not present

## 2024-01-05 DIAGNOSIS — H00015 Hordeolum externum left lower eyelid: Secondary | ICD-10-CM

## 2024-01-05 DIAGNOSIS — R635 Abnormal weight gain: Secondary | ICD-10-CM

## 2024-01-05 DIAGNOSIS — E669 Obesity, unspecified: Secondary | ICD-10-CM | POA: Diagnosis not present

## 2024-01-05 DIAGNOSIS — R6889 Other general symptoms and signs: Secondary | ICD-10-CM

## 2024-01-05 MED ORDER — METFORMIN HCL ER 500 MG PO TB24: 500.0000 mg | ORAL_TABLET | Freq: Every day | ORAL | 1 refills | Status: DC

## 2024-01-05 NOTE — Assessment & Plan Note (Signed)
 Hypertension Chronic Blood pressure is well-controlled with current medication regimen. Current reading is 131/74 mmHg, which is the best it has been in almost 30 years. - Continue current medications: amlodipine  10 mg daily, metoprolol  25 mg daily, and losartan  50 mg daily  - Follow up with cardiology as scheduled

## 2024-01-05 NOTE — Progress Notes (Signed)
 Established patient visit   Patient: Jack Weaver   DOB: 10/25/1955   68 y.o. Male  MRN: [id number] Visit Date: 01/05/2024  Today's healthcare provider: Rockie Agent, MD   Chief Complaint  Patient presents with   Follow-up    F/U ON MEDS.Pt has eye concerns   Subjective     HPI     Follow-up    Additional comments: F/U ON MEDS.Pt has eye concerns      Last edited by Marylen Odella LITTIE, CMA on 01/05/2024  2:13 PM.       Discussed the use of AI scribe software for clinical note transcription with the patient, who gave verbal consent to proceed.  History of Present Illness Jack Weaver is a 68 year old male with hypertension who presents for follow-up and eye concerns.  He is here for follow-up regarding his hypertension. His current medication regimen includes amlodipine  10 mg, losartan  50 mg, and metoprolol  25 mg daily. He reports that these medications do not cause cramps, unlike previous medications such as lisinopril . He also sees a cardiologist, with his last visit in March, and has a wellness visit scheduled for November.  He has eye concerns, specifically a sore area under his left eye that started yesterday. He describes it as a 'blood bite' and notes it is sore but not affecting his vision. He recalls a similar issue previously that lasted about two and a half weeks. He uses warm compresses for relief. No fever, chills, or significant discharge from the eye.  He experiences cold intolerance and suspects he might be anemic, as suggested by his wife. No fatigue, but he cannot tolerate cold air. His last hemoglobin check in September 2023 showed a slightly low level of 11. He does not take a multivitamin.  He is concerned about weight gain, having gained approximately 10 pounds following a sciatica attack that limited his ability to exercise. He currently weighs around 227 pounds, up from his usual 205-206 pounds. He exercises regularly, walking three  miles daily and biking 10-15 miles on weekends, but struggles to lose the weight. He does not track calories but wants to eat less by 'nibbling.'  He has a history of heartburn and reflux, which is managed with Prilosec 40 mg as needed. He notes improvement compared to previous symptoms.  He has reduced his smoking from two cigars to one cigar, typically smoking at home rather than throughout the day.     Past Medical History:  Diagnosis Date   AAA (abdominal aortic aneurysm) (HCC)    Arthritis    GERD (gastroesophageal reflux disease)    Hematuria    Hypertension    Prostate cancer (HCC)    Sleep apnea    no CPAP   Trigeminal neuralgia     Medications: Outpatient Medications Prior to Visit  Medication Sig   amLODipine  (NORVASC ) 10 MG tablet Take 1 tablet (10 mg total) by mouth daily.   fluticasone  (FLONASE ) 50 MCG/ACT nasal spray    losartan  (COZAAR ) 50 MG tablet TAKE 1 TABLET BY MOUTH EVERY DAY   meloxicam  (MOBIC ) 15 MG tablet Take 15 mg by mouth daily.   metoprolol  succinate (TOPROL -XL) 25 MG 24 hr tablet TAKE 1 TABLET EVERY DAY   Multiple Vitamin (MULTIVITAMIN WITH MINERALS) TABS tablet Take 1 tablet by mouth daily.   omeprazole  (PRILOSEC) 40 MG capsule TAKE 1 CAPSULE DAILY AS NEEDED (ACID REFLUX, INDIGESTION)   gabapentin  (NEURONTIN ) 300 MG capsule  (Patient not taking:  Reported on 01/05/2024)   tiZANidine  (ZANAFLEX ) 4 MG tablet Take 0.5-1.5 tablets (2-6 mg total) by mouth every 8 (eight) hours as needed for muscle spasms (muscle tightness). (Patient not taking: Reported on 01/05/2024)   No facility-administered medications prior to visit.    Review of Systems  Last CBC Lab Results  Component Value Date   WBC 3.4 02/07/2022   HGB 11.7 (L) 02/07/2022   HCT 36.1 (L) 02/07/2022   MCV 84 02/07/2022   MCH 27.1 02/07/2022   RDW 13.1 02/07/2022   PLT 193 02/07/2022   Last metabolic panel Lab Results  Component Value Date   GLUCOSE 103 (H) 10/21/2023   NA 142  10/21/2023   K 4.0 10/21/2023   CL 109 10/21/2023   CO2 27 10/21/2023   BUN 21 10/21/2023   CREATININE 1.11 10/21/2023   EGFR 71 10/21/2023   CALCIUM 9.6 10/21/2023   PHOS 3.8 03/27/2021   PROT 6.7 10/21/2023   ALBUMIN  4.5 08/01/2022   LABGLOB 2.4 08/01/2022   AGRATIO 1.9 08/01/2022   BILITOT 0.3 10/21/2023   ALKPHOS 115 08/01/2022   AST 18 10/21/2023   ALT 16 10/21/2023   ANIONGAP 7 07/23/2021   Last lipids Lab Results  Component Value Date   CHOL 173 02/07/2022   HDL 52 02/07/2022   LDLCALC 101 (H) 02/07/2022   TRIG 108 02/07/2022   CHOLHDL 3.3 02/07/2022   Last hemoglobin A1c Lab Results  Component Value Date   HGBA1C 6.4 (H) 07/24/2022   Last thyroid  functions Lab Results  Component Value Date   TSH 1.290 07/24/2022   Last vitamin D  Lab Results  Component Value Date   VD25OH 35.9 08/01/2022   Last vitamin B12 and Folate Lab Results  Component Value Date   VITAMINB12 772 07/02/2016        Objective    BP 131/74 (BP Location: Right Arm, Patient Position: Sitting, Cuff Size: Large)   Resp 16   Ht 6' (1.829 m)   Wt 227 lb (103 kg)   SpO2 98%   BMI 30.79 kg/m  BP Readings from Last 3 Encounters:  01/05/24 131/74  11/25/23 137/80  10/21/23 136/82   Wt Readings from Last 3 Encounters:  01/05/24 227 lb (103 kg)  11/25/23 233 lb (105.7 kg)  10/21/23 230 lb 1.6 oz (104.4 kg)        Physical Exam Vitals reviewed.  Constitutional:      General: He is not in acute distress.    Appearance: Normal appearance. He is not ill-appearing.  Cardiovascular:     Rate and Rhythm: Normal rate and regular rhythm.  Pulmonary:     Effort: Pulmonary effort is normal. No respiratory distress.     Breath sounds: No wheezing, rhonchi or rales.     Comments: Coarse breath sounds on the right  Neurological:     Mental Status: He is alert and oriented to person, place, and time.  Psychiatric:        Mood and Affect: Mood normal.        Behavior: Behavior  normal.       No results found for any visits on 01/05/24.  Assessment & Plan     Problem List Items Addressed This Visit       Cardiovascular and Mediastinum   Essential hypertension - Primary   Hypertension Chronic Blood pressure is well-controlled with current medication regimen. Current reading is 131/74 mmHg, which is the best it has been in almost 30 years. - Continue  current medications: amlodipine  10 mg daily, metoprolol  25 mg daily, and losartan  50 mg daily  - Follow up with cardiology as scheduled        Digestive   Acid reflux     Other   Weight gain   Relevant Medications   metFORMIN  (GLUCOPHAGE -XR) 500 MG 24 hr tablet   Other Visit Diagnoses       Cold intolerance       Relevant Orders   CBC   Fe+TIBC+Fer     Obesity (BMI 30-39.9)       Relevant Medications   metFORMIN  (GLUCOPHAGE -XR) 500 MG 24 hr tablet   Other Relevant Orders   TSH+T4F+T3Free   Hemoglobin A1c     Hordeolum externum of left lower eyelid           Assessment & Plan  Obesity Weight is currently 227 lbs, with a goal to reduce to 200-206 lbs. Despite regular exercise, weight loss has been challenging. Discussed potential pharmacological options for weight loss, including metformin , Topamax, and a combination of Wellbutrin and naltrexone. Metformin  is considered due to its potential to aid in weight loss and improve A1c levels. Discussed potential side effects of metformin , including gastrointestinal issues such as diarrhea. - Prescribe metformin  500 mg extended release daily - Monitor weight and dietary habits - Consider Topamax or Wellbutrin/naltrexone combination if metformin  is not tolerated or effective  Left lower eyelid lesion acute Lesion under the left lower eyelid, likely an insect bite, is sore but not affecting vision. No signs of spreading or significant inflammation. - Apply warm compresses to the affected area - Monitor for changes in vision, fever, chills, or  increased discharge  Cold intolerance Chronic  Reports cold intolerance with a history of low hemoglobin (11 g/dL in September 7976). Possible anemia or thyroid  disorder considered. No fatigue reported. - Order CBC to assess for anemia - Order thyroid  panel (TSH, T4, T3) - Order ferritin level  Gastroesophageal reflux disease without esophagitis Chronic  GERD symptoms are well-managed with intermittent use of Prilosec 40 mg. Symptoms are not as severe as previously experienced. - Continue Prilosec 40 mg as needed  Tobacco use, current cigar smoker Currently smokes one cigar per day, reduced from two. Encouraged to continue reducing tobacco use. Discussed the benefits of cessation. - Encourage further reduction and cessation of tobacco use     Return in about 6 months (around 07/07/2024) for Chronic F/U.         Rockie Agent, MD  [facility] [phone] (phone) [phone] (fax)  [facility]

## 2024-01-06 LAB — CBC
Hematocrit: 38.5 % (ref 37.5–51.0)
Hemoglobin: 12.3 g/dL — ABNORMAL LOW (ref 13.0–17.7)
MCH: 27.7 pg (ref 26.6–33.0)
MCHC: 31.9 g/dL (ref 31.5–35.7)
MCV: 87 fL (ref 79–97)
Platelets: 204 x10E3/uL (ref 150–450)
RBC: 4.44 x10E6/uL (ref 4.14–5.80)
RDW: 13.3 % (ref 11.6–15.4)
WBC: 3.7 x10E3/uL (ref 3.4–10.8)

## 2024-01-06 LAB — HEMOGLOBIN A1C
Est. average glucose Bld gHb Est-mCnc: 128 mg/dL
Hgb A1c MFr Bld: 6.1 % — ABNORMAL HIGH (ref 4.8–5.6)

## 2024-01-06 LAB — IRON,TIBC AND FERRITIN PANEL
Ferritin: 294 ng/mL (ref 30–400)
Iron Saturation: 25 % (ref 15–55)
Iron: 65 ug/dL (ref 38–169)
Total Iron Binding Capacity: 265 ug/dL (ref 250–450)
UIBC: 200 ug/dL (ref 111–343)

## 2024-01-06 LAB — TSH+T4F+T3FREE
Free T4: 1.31 ng/dL (ref 0.82–1.77)
T3, Free: 3.2 pg/mL (ref 2.0–4.4)
TSH: 1.22 u[IU]/mL (ref 0.450–4.500)

## 2024-01-08 ENCOUNTER — Ambulatory Visit: Payer: Self-pay | Admitting: Family Medicine

## 2024-02-06 ENCOUNTER — Other Ambulatory Visit: Payer: Self-pay | Admitting: Family Medicine

## 2024-02-15 DIAGNOSIS — R350 Frequency of micturition: Secondary | ICD-10-CM | POA: Diagnosis not present

## 2024-02-15 DIAGNOSIS — N390 Urinary tract infection, site not specified: Secondary | ICD-10-CM | POA: Diagnosis not present

## 2024-02-19 DIAGNOSIS — M5412 Radiculopathy, cervical region: Secondary | ICD-10-CM | POA: Diagnosis not present

## 2024-02-22 DIAGNOSIS — H2511 Age-related nuclear cataract, right eye: Secondary | ICD-10-CM | POA: Diagnosis not present

## 2024-02-22 DIAGNOSIS — H43813 Vitreous degeneration, bilateral: Secondary | ICD-10-CM | POA: Diagnosis not present

## 2024-02-22 DIAGNOSIS — H538 Other visual disturbances: Secondary | ICD-10-CM | POA: Diagnosis not present

## 2024-02-22 DIAGNOSIS — Z961 Presence of intraocular lens: Secondary | ICD-10-CM | POA: Diagnosis not present

## 2024-03-01 DIAGNOSIS — H2511 Age-related nuclear cataract, right eye: Secondary | ICD-10-CM | POA: Diagnosis not present

## 2024-03-07 ENCOUNTER — Encounter: Payer: Self-pay | Admitting: Ophthalmology

## 2024-03-07 NOTE — Anesthesia Preprocedure Evaluation (Addendum)
 Anesthesia Evaluation  Patient identified by MRN, date of birth, ID band Patient awake    Reviewed: Allergy & Precautions, H&P , NPO status , Patient's Chart, lab work & pertinent test results  Airway Mallampati: III  TM Distance: >3 FB Neck ROM: Full    Dental no notable dental hx.    Pulmonary sleep apnea , Current Smoker and Patient abstained from smoking.   Pulmonary exam normal breath sounds clear to auscultation       Cardiovascular hypertension, Normal cardiovascular exam Rhythm:Regular Rate:Normal  04-17-20 echo 1. Left ventricular ejection fraction, by estimation, is 55 to 60%. The  left ventricle has normal function. The left ventricle has no regional  wall motion abnormalities. There is moderate left ventricular hypertrophy.  Left ventricular diastolic  parameters are consistent with Grade II diastolic dysfunction  (pseudonormalization). The average left ventricular global longitudinal  strain is -16.3 %. The global longitudinal strain is abnormal.   2. Right ventricular systolic function is normal. The right ventricular  size is mildly enlarged. There is normal pulmonary artery systolic  pressure.   3. Left atrial size was moderately dilated.   4. Right atrial size was mildly dilated.   5. The mitral valve is normal in structure. Mild to moderate mitral valve  regurgitation. No evidence of mitral stenosis.   6. Tricuspid valve regurgitation is mild to moderate.   7. The aortic valve is tricuspid. There is mild calcification of the  aortic valve. There is moderate thickening of the aortic valve. Aortic  valve regurgitation is not visualized. Mild to moderate aortic valve  sclerosis/calcification is present, without  any evidence of aortic stenosis.   8. The inferior vena cava is normal in size with <50% respiratory  variability, suggesting right atrial pressure of 8 mmHg.   03-18-23 There is a 3.8 cm infrarenal  abdominal aortic aneurysm, mildly increased since prior where it measured 3.4 cm. Recommend surveillance ultrasound in 3 years. (2027)    Neuro/Psych  Neuromuscular disease  negative psych ROS   GI/Hepatic Neg liver ROS,GERD  ,,  Endo/Other  negative endocrine ROS    Renal/GU negative Renal ROSradical prostatectomy non-nerve sparing with bilateral pelvic lymph node dissection on 10/01/2018.  Surgical pathology was consistent with Gleason 3+5 disease, pT3a N1.  He was noted to have extraprostatic extension with positive posterior margins both left and right.  He is also noted to have 2 of 3 lymph nodes on the left with focal metastatic disease.  0 of 3 lymph nodes on the right.  No bladder neck involvement.  No seminal vesicle involvement.   He underwent staging axumin  PET scan on 11/08/2018 that was negative for metastatic disease and was started on Lupron . He declined whole pelvic radiation.    negative genitourinary   Musculoskeletal  (+) Arthritis ,    Abdominal   Peds negative pediatric ROS (+)  Hematology negative hematology ROS (+)   Anesthesia Other Findings Hypertension  Arthritis GERD (gastroesophageal reflux disease) Hematuria  Pre-diabetes Gastritis without bleeding  Leg cramps Infrarenal abdominal aortic aneurysm (AAA) without rupture  Cervical radiculopathy Sleep apnea  Trigeminal neuralgia Prostate cancer   Grade II diastolic dysfunction 04-17-20 Moderate left ventricular hypertrophy Enlarged LA (left atrium) Moderate mitral regurgitation  Moderate tricuspid regurgitation by prior echocardiogram    Reproductive/Obstetrics negative OB ROS                              Anesthesia Physical Anesthesia  Plan  ASA: 3  Anesthesia Plan: MAC   Post-op Pain Management:    Induction: Intravenous  PONV Risk Score and Plan:   Airway Management Planned: Natural Airway and Nasal Cannula  Additional Equipment:   Intra-op Plan:    Post-operative Plan:   Informed Consent: I have reviewed the patients History and Physical, chart, labs and discussed the procedure including the risks, benefits and alternatives for the proposed anesthesia with the patient or authorized representative who has indicated his/her understanding and acceptance.     Dental Advisory Given  Plan Discussed with: Anesthesiologist, CRNA and Surgeon  Anesthesia Plan Comments: (Patient consented for risks of anesthesia including but not limited to:  - adverse reactions to medications - damage to eyes, teeth, lips or other oral mucosa - nerve damage due to positioning  - sore throat or hoarseness - Damage to heart, brain, nerves, lungs, other parts of body or loss of life  Patient voiced understanding and assent.)         Anesthesia Quick Evaluation

## 2024-03-07 NOTE — Discharge Instructions (Signed)

## 2024-03-09 ENCOUNTER — Encounter
Admission: RE | Disposition: A | Discharge status: Home / Self Care | Payer: Self-pay | Source: Home / Self Care | Attending: Ophthalmology

## 2024-03-09 ENCOUNTER — Ambulatory Visit
Admission: RE | Admit: 2024-03-09 | Discharge: 2024-03-09 | Disposition: A | Discharge status: Home / Self Care | Attending: Ophthalmology | Admitting: Ophthalmology

## 2024-03-09 ENCOUNTER — Other Ambulatory Visit: Payer: Self-pay

## 2024-03-09 ENCOUNTER — Ambulatory Visit: Payer: Self-pay | Admitting: Anesthesiology

## 2024-03-09 ENCOUNTER — Encounter: Payer: Self-pay | Admitting: Anesthesiology

## 2024-03-09 ENCOUNTER — Encounter: Payer: Self-pay | Admitting: Ophthalmology

## 2024-03-09 DIAGNOSIS — M1990 Unspecified osteoarthritis, unspecified site: Secondary | ICD-10-CM | POA: Insufficient documentation

## 2024-03-09 DIAGNOSIS — K219 Gastro-esophageal reflux disease without esophagitis: Secondary | ICD-10-CM | POA: Insufficient documentation

## 2024-03-09 DIAGNOSIS — Z961 Presence of intraocular lens: Secondary | ICD-10-CM | POA: Insufficient documentation

## 2024-03-09 DIAGNOSIS — H2511 Age-related nuclear cataract, right eye: Secondary | ICD-10-CM | POA: Diagnosis not present

## 2024-03-09 DIAGNOSIS — G4730 Sleep apnea, unspecified: Secondary | ICD-10-CM | POA: Insufficient documentation

## 2024-03-09 DIAGNOSIS — Z79899 Other long term (current) drug therapy: Secondary | ICD-10-CM | POA: Insufficient documentation

## 2024-03-09 DIAGNOSIS — I10 Essential (primary) hypertension: Secondary | ICD-10-CM | POA: Diagnosis not present

## 2024-03-09 DIAGNOSIS — I081 Rheumatic disorders of both mitral and tricuspid valves: Secondary | ICD-10-CM | POA: Diagnosis not present

## 2024-03-09 DIAGNOSIS — F17200 Nicotine dependence, unspecified, uncomplicated: Secondary | ICD-10-CM | POA: Diagnosis not present

## 2024-03-09 DIAGNOSIS — Z9842 Cataract extraction status, left eye: Secondary | ICD-10-CM | POA: Insufficient documentation

## 2024-03-09 DIAGNOSIS — F17290 Nicotine dependence, other tobacco product, uncomplicated: Secondary | ICD-10-CM | POA: Diagnosis not present

## 2024-03-09 HISTORY — DX: Radiculopathy, cervical region: M54.12

## 2024-03-09 HISTORY — DX: Cramp and spasm: R25.2

## 2024-03-09 HISTORY — DX: Prediabetes: R73.03

## 2024-03-09 HISTORY — DX: Gastritis, unspecified, without bleeding: K29.70

## 2024-03-09 HISTORY — DX: Nonrheumatic mitral (valve) insufficiency: I34.0

## 2024-03-09 HISTORY — DX: Cardiomegaly: I51.7

## 2024-03-09 HISTORY — DX: Rheumatic tricuspid insufficiency: I07.1

## 2024-03-09 SURGERY — PHACOEMULSIFICATION, CATARACT, WITH IOL INSERTION
Anesthesia: Monitor Anesthesia Care | Site: Eye | Laterality: Right

## 2024-03-09 MED ORDER — ARMC OPHTHALMIC DILATING DROPS
1.0000 | OPHTHALMIC | Status: DC | PRN
  Administered 2024-03-09 (×3): 1 via OPHTHALMIC

## 2024-03-09 MED ORDER — ARMC OPHTHALMIC DILATING DROPS
OPHTHALMIC | Status: AC
  Filled 2024-03-09: qty 0.5

## 2024-03-09 MED ORDER — LACTATED RINGERS IV SOLN: INTRAVENOUS | Status: DC

## 2024-03-09 MED ORDER — DEXMEDETOMIDINE HCL IN NACL 80 MCG/20ML IV SOLN
INTRAVENOUS | Status: AC
  Filled 2024-03-09: qty 20

## 2024-03-09 MED ORDER — FENTANYL CITRATE (PF) 100 MCG/2ML IJ SOLN
INTRAMUSCULAR | Status: AC
  Filled 2024-03-09: qty 2

## 2024-03-09 MED ORDER — MIDAZOLAM HCL 2 MG/2ML IJ SOLN
INTRAMUSCULAR | Status: AC
  Filled 2024-03-09: qty 2

## 2024-03-09 MED ORDER — MOXIFLOXACIN HCL 0.5 % OP SOLN: OPHTHALMIC | Status: DC | PRN

## 2024-03-09 MED ORDER — MIDAZOLAM HCL 2 MG/2ML IJ SOLN
INTRAMUSCULAR | Status: DC | PRN
  Administered 2024-03-09: 2 mg via INTRAVENOUS

## 2024-03-09 MED ORDER — SIGHTPATH DOSE#1 BSS IO SOLN
INTRAOCULAR | Status: DC | PRN
  Administered 2024-03-09: 15 mL via INTRAOCULAR

## 2024-03-09 MED ORDER — TETRACAINE HCL 0.5 % OP SOLN
OPHTHALMIC | Status: AC
  Filled 2024-03-09: qty 4

## 2024-03-09 MED ORDER — LIDOCAINE HCL (PF) 2 % IJ SOLN
INTRAOCULAR | Status: DC | PRN
  Administered 2024-03-09: 2 mL

## 2024-03-09 MED ORDER — CEFUROXIME OPHTHALMIC INJECTION 1 MG/0.1 ML
INJECTION | OPHTHALMIC | Status: DC | PRN
  Administered 2024-03-09: 1 mg via INTRACAMERAL

## 2024-03-09 MED ORDER — DEXMEDETOMIDINE HCL IN NACL 200 MCG/50ML IV SOLN
INTRAVENOUS | Status: DC | PRN
Start: 2024-03-09 — End: 2024-03-09
  Administered 2024-03-09: 12 ug via INTRAVENOUS
  Administered 2024-03-09: 8 ug via INTRAVENOUS

## 2024-03-09 MED ORDER — SIGHTPATH DOSE#1 BSS IO SOLN
INTRAOCULAR | Status: DC | PRN
  Administered 2024-03-09: 64 mL via OPHTHALMIC

## 2024-03-09 MED ORDER — SIGHTPATH DOSE#1 NA HYALUR & NA CHOND-NA HYALUR IO KIT
PACK | INTRAOCULAR | Status: DC | PRN
  Administered 2024-03-09: 1 via OPHTHALMIC

## 2024-03-09 MED ORDER — FENTANYL CITRATE (PF) 100 MCG/2ML IJ SOLN
INTRAMUSCULAR | Status: DC | PRN
  Administered 2024-03-09 (×2): 50 ug via INTRAVENOUS

## 2024-03-09 MED ORDER — TETRACAINE HCL 0.5 % OP SOLN
1.0000 [drp] | OPHTHALMIC | Status: DC | PRN
  Administered 2024-03-09 (×3): 1 [drp] via OPHTHALMIC

## 2024-03-09 MED ORDER — BRIMONIDINE TARTRATE-TIMOLOL 0.2-0.5 % OP SOLN
OPHTHALMIC | Status: DC | PRN
  Administered 2024-03-09: 1 [drp] via OPHTHALMIC

## 2024-03-09 SURGICAL SUPPLY — 8 items
FEE CATARACT SUITE SIGHTPATH (MISCELLANEOUS) ×1 IMPLANT
GLOVE BIOGEL PI IND STRL 8 (GLOVE) ×1 IMPLANT
GLOVE SURG LX STRL 7.5 STRW (GLOVE) ×1 IMPLANT
GLOVE SURG SYN 6.5 PF PI BL (GLOVE) ×1 IMPLANT
LENS IOL TECNIS EYHANCE 20.0 (Intraocular Lens) IMPLANT
NDL FILTER BLUNT 18X1 1/2 (NEEDLE) ×1 IMPLANT
NEEDLE FILTER BLUNT 18X1 1/2 (NEEDLE) ×1 IMPLANT
SYR 3ML LL SCALE MARK (SYRINGE) ×1 IMPLANT

## 2024-03-09 NOTE — Op Note (Signed)
 LOCATION:  [facility]   PREOPERATIVE DIAGNOSIS:    Nuclear sclerotic cataract right eye. H25.11   POSTOPERATIVE DIAGNOSIS:  Nuclear sclerotic cataract right eye.     PROCEDURE:  Phacoemusification with posterior chamber intraocular lens placement of the right eye   ULTRASOUND TIME: Procedure(s): PHACOEMULSIFICATION, CATARACT, WITH IOL INSERTION 3.69 00:33.2 (Right)  LENS:   Implant Name Type Inv. Item Serial No. Manufacturer Lot No. LRB No. Used Action  LENS IOL TECNIS EYHANCE 20.0 - [id number] Intraocular Lens LENS IOL TECNIS EYHANCE 20.0 [id number] [employer]  Right 1 Implanted         SURGEON:  Dene FABIENE Etienne, MD   ANESTHESIA:  Topical with tetracaine  drops and 2% Xylocaine  jelly, augmented with 1% preservative-free intracameral lidocaine .    COMPLICATIONS:  None.   DESCRIPTION OF PROCEDURE:  The patient was identified in the holding room and transported to the operating room and placed in the supine position under the operating microscope.  The right eye was identified as the operative eye and it was prepped and draped in the usual sterile ophthalmic fashion.   A 1 millimeter clear-corneal paracentesis was made at the 12:00 position.  0.5 ml of preservative-free 1% lidocaine  was injected into the anterior chamber. The anterior chamber was filled with Viscoat viscoelastic.  A 2.4 millimeter keratome was used to make a near-clear corneal incision at the 9:00 position.  A curvilinear capsulorrhexis was made with a cystotome and capsulorrhexis forceps.  Balanced salt  solution was used to hydrodissect and hydrodelineate the nucleus.   Phacoemulsification was then used in stop and chop fashion to remove the lens nucleus and epinucleus.  The remaining cortex was then removed using the irrigation and aspiration handpiece. Provisc was then placed into the capsular bag to distend it for lens placement.  A lens was then injected into the capsular bag.  The remaining  viscoelastic was aspirated.   Wounds were hydrated with balanced salt  solution.  The anterior chamber was inflated to a physiologic pressure with balanced salt  solution.  No wound leaks were noted. Cefuroxime  0.1 ml of a 10mg /ml solution was injected into the anterior chamber for a dose of 1 mg of intracameral antibiotic at the completion of the case.   Timolol  and Brimonidine  drops were applied to the eye.  The patient was taken to the recovery room in stable condition without complications of anesthesia or surgery.   Cabe Lashley 03/09/2024, 8:49 AM

## 2024-03-09 NOTE — H&P (Signed)
 [facility]   Primary Care Physician:  Sharma Coyer, MD Ophthalmologist: Dr. Dene Etienne  Pre-Procedure History & Physical: HPI:  Jack Weaver is a 68 y.o. male here for ophthalmic surgery.   Past Medical History:  Diagnosis Date   AAA (abdominal aortic aneurysm)    Arthritis    Cervical radiculopathy    Enlarged LA (left atrium)    Gastritis without bleeding    GERD (gastroesophageal reflux disease)    Grade II diastolic dysfunction 04/17/2020   Hematuria    Hypertension    Infrarenal abdominal aortic aneurysm (AAA) without rupture 03/18/2023   3.8 cm infrarenal abdominal aortic aneurysm. Recommend surveillance ultrasound in 3 years.(2027)   Leg cramps    Moderate left ventricular hypertrophy 04/17/2020   Moderate mitral regurgitation    Moderate tricuspid regurgitation by prior echocardiogram    Pre-diabetes    Prostate cancer [facility])    Sleep apnea    Trigeminal neuralgia     Past Surgical History:  Procedure Laterality Date   APPENDECTOMY     At age 24   CATARACT EXTRACTION W/PHACO Left 08/08/2020   Procedure: CATARACT EXTRACTION PHACO AND INTRAOCULAR LENS PLACEMENT (IOC) LEFT 5.62 01:00.4 903%;  Surgeon: Etienne Dene, MD;  Location: [facility];  Service: Ophthalmology;  Laterality: Left;   COLONOSCOPY N/A 09/17/2023   Procedure: COLONOSCOPY;  Surgeon: Therisa Bi, MD;  Location: [facility];  Service: Gastroenterology;  Laterality: N/A;   HEMORROIDECTOMY  1930   PELVIC LYMPH NODE DISSECTION Bilateral 10/11/2018   Procedure: PELVIC LYMPH NODE DISSECTION;  Surgeon: Jack Knee, MD;  Location: [facility];  Service: Urology;  Laterality: Bilateral;   QUADRICEPS TENDON REPAIR Left 09/09/2017   Procedure: REPAIR QUADRICEP TENDON;  Surgeon: Cleotilde Barrio, MD;  Location: [facility] OR[facility];  Service: Orthopedics;  Laterality: Left;   ROBOT ASSISTED LAPAROSCOPIC RADICAL PROSTATECTOMY N/A 10/11/2018   Procedure: ROBOTIC ASSISTED  LAPAROSCOPIC RADICAL PROSTATECTOMY;  Surgeon: Jack Knee, MD;  Location: [facility] OR[facility];  Service: Urology;  Laterality: N/A;    Prior to Admission medications   Medication Sig Start Date End Date Taking? Authorizing Provider  amLODipine  (NORVASC ) 10 MG tablet Take 1 tablet (10 mg total) by mouth daily. 07/07/23  Yes Furth, Cadence H, PA-C  fluticasone  (FLONASE ) 50 MCG/ACT nasal spray  09/30/20  Yes [provider]  ibuprofen  (ADVIL ) 600 MG tablet Take 600 mg by mouth every 6 (six) hours as needed.   Yes [provider]  losartan  (COZAAR ) 50 MG tablet TAKE 1 TABLET EVERY DAY 02/08/24  Yes Simmons-Robinson, Makiera, MD  metoprolol  succinate (TOPROL -XL) 25 MG 24 hr tablet TAKE 1 TABLET EVERY DAY 06/22/23  Yes Simmons-Robinson, Makiera, MD  Multiple Vitamin (MULTIVITAMIN WITH MINERALS) TABS tablet Take 1 tablet by mouth daily.   Yes [provider]  omeprazole  (PRILOSEC) 40 MG capsule TAKE 1 CAPSULE DAILY AS NEEDED (ACID REFLUX, INDIGESTION) 11/06/22  Yes Simmons-Robinson, Makiera, MD  gabapentin  (NEURONTIN ) 300 MG capsule     [provider]    Allergies as of 02/24/2024   (No Known Allergies)    Family History  Problem Relation Age of Onset   Heart attack Mother    Stroke Father    Asthma Father    Cancer Brother    Diabetes Brother    Prostate cancer Neg Hx    Kidney cancer Neg Hx    Bladder Cancer Neg Hx     Social History   Socioeconomic History   Marital status: Married    Spouse name:  Not on file   Number of children: Not on file   Years of education: Not on file   Highest education level: Not on file  Occupational History   Not on file  Tobacco Use   Smoking status: Every Day    Types: Cigars   Smokeless tobacco: Never  Vaping Use   Vaping status: Never Used  Substance and Sexual Activity   Alcohol use: Yes    Alcohol/week: 2.0 standard drinks of alcohol    Types: 2 Cans of beer per week    Comment: occasionally   Drug use: Never    Sexual activity: Not on file  Other Topics Concern   Not on file  Social History Narrative   Not on file   Social Drivers of Health   Financial Resource Strain: Low Risk  (09/23/2022)   Overall Financial Resource Strain (CARDIA)    Difficulty of Paying Living Expenses: Not hard at all  Food Insecurity: No Food Insecurity (09/23/2022)   Hunger Vital Sign    Worried About Running Out of Food in the Last Year: Never true    Ran Out of Food in the Last Year: Never true  Transportation Needs: No Transportation Needs (09/23/2022)   PRAPARE - Administrator, Civil Service (Medical): No    Lack of Transportation (Non-Medical): No  Physical Activity: Sufficiently Active (09/23/2022)   Exercise Vital Sign    Days of Exercise per Week: 6 days    Minutes of Exercise per Session: 70 min  Stress: No Stress Concern Present (09/23/2022)   [employer] of Occupational Health - Occupational Stress Questionnaire    Feeling of Stress : Not at all  Social Connections: Moderately Integrated (09/23/2022)   Social Connection and Isolation Panel    Frequency of Communication with Friends and Family: More than three times a week    Frequency of Social Gatherings with Friends and Family: Twice a week    Attends Religious Services: 1 to 4 times per year    Active Member of [employer] or Organizations: No    Attends Banker Meetings: Never    Marital Status: Married  Catering manager Violence: Not At Risk (09/23/2022)   Humiliation, Afraid, Rape, and Kick questionnaire    Fear of Current or Ex-Partner: No    Emotionally Abused: No    Physically Abused: No    Sexually Abused: No    Review of Systems: See HPI, otherwise negative ROS  Physical Exam: BP (!) 150/81   Pulse (!) 53   Temp 98.2 F (36.8 C) (Temporal)   Resp 13   Ht 6' (1.829 m)   Wt 106.1 kg   SpO2 100%   BMI 31.72 kg/m  General:   Alert,  pleasant and cooperative in NAD Head:  Normocephalic and  atraumatic. Lungs:  Clear to auscultation.    Heart:  Regular rate and rhythm.   Impression/Plan: Jack Weaver is here for ophthalmic surgery.  Risks, benefits, limitations, and alternatives regarding ophthalmic surgery have been reviewed with the patient.  Questions have been answered.  All parties agreeable.   MITTIE GASKIN, MD  03/09/2024, 8:06 AM

## 2024-03-09 NOTE — Transfer of Care (Signed)
 Immediate Anesthesia Transfer of Care Note  Patient: Jack Weaver  Procedure(s) Performed: PHACOEMULSIFICATION, CATARACT, WITH IOL INSERTION 3.69 00:33.2 (Right: Eye)  Patient Location: PACU  Anesthesia Type: MAC  Level of Consciousness: awake, alert  and patient cooperative  Airway and Oxygen Therapy: Patient Spontanous Breathing   Post-op Assessment: Post-op Vital signs reviewed, Patient's Cardiovascular Status Stable, Respiratory Function Stable, Patent Airway and No signs of Nausea or vomiting  Post-op Vital Signs: Reviewed and stable  Complications: No notable events documented.

## 2024-03-09 NOTE — Anesthesia Postprocedure Evaluation (Signed)
 Anesthesia Post Note  Patient: Jack Weaver  Procedure(s) Performed: PHACOEMULSIFICATION, CATARACT, WITH IOL INSERTION 3.69 00:33.2 (Right: Eye)  Patient location during evaluation: PACU Anesthesia Type: MAC Level of consciousness: awake and alert Pain management: pain level controlled Vital Signs Assessment: post-procedure vital signs reviewed and stable Respiratory status: spontaneous breathing, nonlabored ventilation, respiratory function stable and patient connected to nasal cannula oxygen Cardiovascular status: stable and blood pressure returned to baseline Postop Assessment: no apparent nausea or vomiting Anesthetic complications: no   No notable events documented.   Last Vitals:  Vitals:   03/09/24 0850 03/09/24 0856  BP: 106/69 108/72  Pulse: (!) 53 (!) 51  Resp: 11 13  Temp: 36.6 C 36.6 C  SpO2: 99% 98%    Last Pain:  Vitals:   03/09/24 0856  TempSrc:   PainSc: 0-No pain                 Chozen Latulippe C Makynli Stills

## 2024-03-16 ENCOUNTER — Other Ambulatory Visit: Payer: Self-pay

## 2024-03-16 DIAGNOSIS — C61 Malignant neoplasm of prostate: Secondary | ICD-10-CM

## 2024-03-17 ENCOUNTER — Other Ambulatory Visit

## 2024-03-18 ENCOUNTER — Other Ambulatory Visit

## 2024-03-18 DIAGNOSIS — C61 Malignant neoplasm of prostate: Secondary | ICD-10-CM

## 2024-03-18 DIAGNOSIS — M5416 Radiculopathy, lumbar region: Secondary | ICD-10-CM | POA: Diagnosis not present

## 2024-03-19 LAB — PSA: Prostate Specific Ag, Serum: <0.1 ng/mL (ref 0.0–4.0)

## 2024-03-22 ENCOUNTER — Ambulatory Visit: Admitting: Physician Assistant

## 2024-03-22 VITALS — BP 157/96 | HR 63 | Ht 72.0 in | Wt 227.0 lb

## 2024-03-22 DIAGNOSIS — N50812 Left testicular pain: Secondary | ICD-10-CM | POA: Diagnosis not present

## 2024-03-22 DIAGNOSIS — C61 Malignant neoplasm of prostate: Secondary | ICD-10-CM | POA: Diagnosis not present

## 2024-03-22 MED ORDER — LEUPROLIDE ACETATE (6 MONTH) 45 MG ~~LOC~~ KIT
45.0000 mg | PACK | Freq: Once | SUBCUTANEOUS | Status: AC
  Administered 2024-03-22: 45 mg via SUBCUTANEOUS

## 2024-03-22 NOTE — Progress Notes (Addendum)
 Eligard  SubQ Injection   Due to Prostate Cancer patient is present today for a Eligard  Injection.  Medication: Eligard  6 month Dose: 45 mg  Location: left arm Lot: [id number] Exp: 01/2025  Patient tolerated well, no complications were noted  Performed by: Beauford Browner, CCMA  Per Lucie Hones patient is to continue therapy indefinitely. Patient's next follow up was scheduled for 09/15/2024. This appointment was scheduled using wheel and given to patient today along with reminder continue on Vitamin D  800-1000iu and Calcium 1000-1200mg  daily while on Androgen Deprivation Therapy.  PA approval dates:

## 2024-03-22 NOTE — Patient Instructions (Signed)
 Please take the following dietary supplements for the duration of your hormone suppression therapy to reduce your risk for bone loss: -Calcium 1000-1200mg  daily -Vitamin D 800-1000IU daily

## 2024-03-22 NOTE — Progress Notes (Signed)
 03/22/2024 10:06 AM   Jack Weaver 1955/11/19 [id number]  CC: Chief Complaint  Patient presents with   Follow-up   Prostate Cancer   HPI: Jack Weaver is a 68 y.o. male with PMH metastatic prostate cancer on Eligard  who presents today for follow-up.   Today he reports overall he is doing well and tolerating ADT without difficulty.  He is taking a daily men's multivitamin, which he thinks contains calcium and vitamin D .  He describes about a week of intermittent left testicular pain without dysuria or swelling.  Most recent PSA from 4 days ago remains undetectable.  PMH: Past Medical History:  Diagnosis Date   AAA (abdominal aortic aneurysm)    Arthritis    Cervical radiculopathy    Enlarged LA (left atrium)    Gastritis without bleeding    GERD (gastroesophageal reflux disease)    Grade II diastolic dysfunction 04/17/2020   Hematuria    Hypertension    Infrarenal abdominal aortic aneurysm (AAA) without rupture 03/18/2023   3.8 cm infrarenal abdominal aortic aneurysm. Recommend surveillance ultrasound in 3 years.(2027)   Leg cramps    Moderate left ventricular hypertrophy 04/17/2020   Moderate mitral regurgitation    Moderate tricuspid regurgitation by prior echocardiogram    Pre-diabetes    Prostate cancer [facility])    Sleep apnea    Trigeminal neuralgia     Surgical History: Past Surgical History:  Procedure Laterality Date   APPENDECTOMY     At age 79   CATARACT EXTRACTION W/PHACO Left 08/08/2020   Procedure: CATARACT EXTRACTION PHACO AND INTRAOCULAR LENS PLACEMENT (IOC) LEFT 5.62 01:00.4 903%;  Surgeon: Mittie Gaskin, MD;  Location: [facility];  Service: Ophthalmology;  Laterality: Left;   CATARACT EXTRACTION W/PHACO Right 03/09/2024   Procedure: PHACOEMULSIFICATION, CATARACT, WITH IOL INSERTION 3.69 00:33.2;  Surgeon: Mittie Gaskin, MD;  Location: [facility];  Service: Ophthalmology;  Laterality: Right;   COLONOSCOPY N/A  09/17/2023   Procedure: COLONOSCOPY;  Surgeon: Therisa Bi, MD;  Location: [facility];  Service: Gastroenterology;  Laterality: N/A;   HEMORROIDECTOMY  1930   PELVIC LYMPH NODE DISSECTION Bilateral 10/11/2018   Procedure: PELVIC LYMPH NODE DISSECTION;  Surgeon: Penne Knee, MD;  Location: [facility] ORS;  Service: Urology;  Laterality: Bilateral;   QUADRICEPS TENDON REPAIR Left 09/09/2017   Procedure: REPAIR QUADRICEP TENDON;  Surgeon: Cleotilde Barrio, MD;  Location: [facility] ORS;  Service: Orthopedics;  Laterality: Left;   ROBOT ASSISTED LAPAROSCOPIC RADICAL PROSTATECTOMY N/A 10/11/2018   Procedure: ROBOTIC ASSISTED LAPAROSCOPIC RADICAL PROSTATECTOMY;  Surgeon: Penne Knee, MD;  Location: [facility] ORS;  Service: Urology;  Laterality: N/A;    Home Medications:  Allergies as of 03/22/2024   No Known Allergies      Medication List        Accurate as of March 22, 2024 10:06 AM. If you have any questions, ask your nurse or doctor.          STOP taking these medications    gabapentin  300 MG capsule Commonly known as: NEURONTIN    ibuprofen  600 MG tablet Commonly known as: ADVIL        TAKE these medications    amLODipine  10 MG tablet Commonly known as: NORVASC  Take 1 tablet (10 mg total) by mouth daily.   fluticasone  50 MCG/ACT nasal spray Commonly known as: FLONASE  What changed: See the new instructions.   losartan  50 MG tablet Commonly known as: COZAAR  TAKE 1 TABLET EVERY DAY   metoprolol  succinate 25 MG 24 hr tablet Commonly  known as: TOPROL -XL TAKE 1 TABLET EVERY DAY   multivitamin with minerals Tabs tablet Take 1 tablet by mouth daily.   omeprazole  40 MG capsule Commonly known as: PRILOSEC TAKE 1 CAPSULE DAILY AS NEEDED (ACID REFLUX, INDIGESTION) What changed:  how much to take how to take this when to take this reasons to take this        Allergies:  No Known Allergies  Family History: Family History  Problem Relation Age of Onset   Heart attack  Mother    Stroke Father    Asthma Father    Cancer Brother    Diabetes Brother    Prostate cancer Neg Hx    Kidney cancer Neg Hx    Bladder Cancer Neg Hx     Social History:   reports that he has been smoking cigars. He has never used smokeless tobacco. He reports current alcohol use of about 2.0 standard drinks of alcohol per week. He reports that he does not use drugs.  Physical Exam: BP (!) 157/96 (BP Location: Left Arm, Patient Position: Sitting, Cuff Size: Large)   Pulse 63   Ht 6' (1.829 m)   Wt 227 lb (103 kg)   SpO2 100%   BMI 30.79 kg/m   Constitutional:  Alert and oriented, no acute distress, nontoxic appearing HEENT: [place], AT Cardiovascular: No clubbing, cyanosis, or edema Respiratory: Normal respiratory effort, no increased work of breathing GU: Exam declined Skin: No rashes, bruises or suspicious lesions Neurologic: Grossly intact, no focal deficits, moving all 4 extremities Psychiatric: Normal mood and affect  Laboratory Data: Results for orders placed or performed in visit on 03/18/24  PSA   Collection Time: 03/18/24  8:16 AM  Result Value Ref Range   Prostate Specific Ag, Serum <0.1 0.0 - 4.0 ng/mL   Assessment & Plan:   1. Prostate cancer (HCC) (Primary) PSA remains undetectable, excellent biochemical control.  Will continue ADT.  I gave him the target doses of calcium and vitamin D  in his AVS today and asked him to double check that he is on the correct dose.  11-month Eligard  administered today, see separate note. - PSA; Future - leuprolide  (6 Month) (ELIGARD ) injection 45 mg  2. Left testicular pain He declined physical exam today.  NSAIDs contraindicated with his history of gastritis.  We discussed Tylenol  for pain relief and return to clinic if his symptoms worsen.  Return in about 6 months (around 09/20/2024) for Prostate cancer follow up with Dr. Georganne with PSA prior, Eligard .  Lucie Hones, PA-C  [facility] [place] [address] [place], [place] [address] [phone]

## 2024-03-28 ENCOUNTER — Ambulatory Visit: Payer: Self-pay

## 2024-03-28 NOTE — Telephone Encounter (Signed)
 FYI Only or Action Required?: FYI only for provider: appointment scheduled on 11.4/25.  Patient was last seen in primary care on 01/05/2024 by Sharma Coyer, MD.  Called Nurse Triage reporting Neurologic Problem.  Symptoms began a week ago.  Interventions attempted: Nothing.  Symptoms are: stable.  Triage Disposition: See PCP When Office is Open (Within 3 Days)  Patient/caregiver understands and will follow disposition?: Yes    Reason for Disposition  [1] Numbness or tingling on both sides of body AND [2] is a new symptom present > 24 hours  Answer Assessment - Initial Assessment Questions Patient states he had a lumbar injection on October 25th   1. SYMPTOM: What is the main symptom you are concerned about? (e.g., weakness, numbness)     Cramping, tingling, pain in both legs - mainly on the front part of legs. Patient describes this as twitching. 2. ONSET: When did this start? (e.g., minutes, hours, days; while sleeping)     A week ago 3. LAST NORMAL: When was the last time you (the patient) were normal (no symptoms)?     One week ago 4. PATTERN Does this come and go, or has it been constant since it started?  Is it present now?     Constant 5. CARDIAC SYMPTOMS: Have you had any of the following symptoms: chest pain, difficulty breathing, palpitations?     AAA 6. NEUROLOGIC SYMPTOMS: Have you had any of the following symptoms: headache, dizziness, vision loss, double vision, changes in speech, unsteady on your feet?     Numbness on bottom of feet 7. OTHER SYMPTOMS: Do you have any other symptoms?     no  Protocols used: Neurologic Deficit-A-AH

## 2024-03-28 NOTE — Telephone Encounter (Signed)
 Patient call note and symptoms reviewed. Agree with scheduled appt. Will evaluate during OV

## 2024-03-29 ENCOUNTER — Ambulatory Visit: Admitting: Family Medicine

## 2024-03-29 ENCOUNTER — Encounter: Payer: Self-pay | Admitting: Family Medicine

## 2024-03-29 VITALS — BP 138/84 | HR 58 | Ht 72.0 in | Wt 239.2 lb

## 2024-03-29 DIAGNOSIS — Z23 Encounter for immunization: Secondary | ICD-10-CM | POA: Diagnosis not present

## 2024-03-29 DIAGNOSIS — G2581 Restless legs syndrome: Secondary | ICD-10-CM

## 2024-03-29 DIAGNOSIS — M79605 Pain in left leg: Secondary | ICD-10-CM

## 2024-03-29 DIAGNOSIS — M79604 Pain in right leg: Secondary | ICD-10-CM

## 2024-03-29 DIAGNOSIS — M7122 Synovial cyst of popliteal space [Baker], left knee: Secondary | ICD-10-CM | POA: Diagnosis not present

## 2024-03-29 MED ORDER — ROPINIROLE HCL 0.5 MG PO TABS: 0.5000 mg | ORAL_TABLET | Freq: Every day | ORAL | 1 refills | Status: DC

## 2024-03-29 NOTE — Progress Notes (Signed)
 Established patient visit   Patient: Jack Weaver   DOB: [birth year]   68 y.o. Male  MRN: [id number] Visit Date: 03/29/2024  Today's healthcare provider: Rockie Agent, MD   Chief Complaint  Patient presents with   Numbness    Patient reports cramping, tingling pain in both legs onset x one week, constant. Feels as if this is happening in both feet as well.  For relief patient will walk it off or massage the area    Subjective     HPI     Numbness    Additional comments: Patient reports cramping, tingling pain in both legs onset x one week, constant. Feels as if this is happening in both feet as well.  For relief patient will walk it off or massage the area       Last edited by Cherry Chiquita HERO, CMA on 03/29/2024  4:00 PM.       Discussed the use of AI scribe software for clinical note transcription with the patient, who gave verbal consent to proceed.  History of Present Illness Jack Weaver is a 68 year old male with degenerative arthritis who presents with paresthesias in his legs.  He has experienced cramping and tingling pain in both legs for the past week, which he describes as constant and also affecting his feet. Walking or massaging the area provides some relief. The symptoms are worse in the mornings upon getting out of bed and have been severe enough to disturb his sleep recently. He describes the sensation as feeling like 'something is crawling up under my leg' and his feet feel like he is 'walking on marbles'.  He has a history of degenerative arthritis and arthropathy, and mentions having had parasitics of the hips. Similar symptoms have occurred off and on over the years, but have worsened recently. He also mentions having had a prostate removed, which affects his ability to hold his bladder.  He currently takes a men's one-a-day vitamin. No numbness in areas where his underwear would touch or issues with bowel control. He has had a compressed  nerve treated with an injection in his back about two weeks ago.     Past Medical History:  Diagnosis Date   AAA (abdominal aortic aneurysm)    Arthritis    Cervical radiculopathy    Enlarged LA (left atrium)    Gastritis without bleeding    GERD (gastroesophageal reflux disease)    Grade II diastolic dysfunction 04/17/2020   Hematuria    Hypertension    Infrarenal abdominal aortic aneurysm (AAA) without rupture 03/18/2023   3.8 cm infrarenal abdominal aortic aneurysm. Recommend surveillance ultrasound in 3 years.(2027)   Leg cramps    Moderate left ventricular hypertrophy 04/17/2020   Moderate mitral regurgitation    Moderate tricuspid regurgitation by prior echocardiogram    Pre-diabetes    Prostate cancer [facility])    Sleep apnea    Trigeminal neuralgia     Medications: Outpatient Medications Prior to Visit  Medication Sig   amLODipine  (NORVASC ) 10 MG tablet Take 1 tablet (10 mg total) by mouth daily.   fluticasone  (FLONASE ) 50 MCG/ACT nasal spray  (Patient taking differently: Place 1 spray into both nostrils as needed.)   losartan  (COZAAR ) 50 MG tablet TAKE 1 TABLET EVERY DAY   metoprolol  succinate (TOPROL -XL) 25 MG 24 hr tablet TAKE 1 TABLET EVERY DAY   Multiple Vitamin (MULTIVITAMIN WITH MINERALS) TABS tablet Take 1 tablet by mouth daily.   omeprazole  (  PRILOSEC) 40 MG capsule TAKE 1 CAPSULE DAILY AS NEEDED (ACID REFLUX, INDIGESTION) (Patient taking differently: Take 40 mg by mouth as needed. TAKE 1 CAPSULE DAILY AS NEEDED (ACID REFLUX, INDIGESTION))   No facility-administered medications prior to visit.    Review of Systems      Objective    BP 138/84 (BP Location: Right Arm, Patient Position: Sitting, Cuff Size: Normal)   Pulse (!) 58   Ht 6' (1.829 m)   Wt 239 lb 3.2 oz (108.5 kg)   SpO2 100%   BMI 32.44 kg/m      Physical Exam  Physical Exam VITALS: P- 58, BP- 138/84 MEASUREMENTS: BMI- 32.0. MUSCULOSKELETAL: Normal range of motion and 5/5 strength in  bilateral hips and knees. No cystic swelling in right popliteal region. Soft, cystic, non-tender swelling in left popliteal region. Crampy pain in right lower extremity with extension while supine. NEUROLOGICAL: Normal gait.    No results found for any visits on 03/29/24.  Assessment & Plan     Problem List Items Addressed This Visit   None Visit Diagnoses       Restless legs    -  Primary   Relevant Medications   rOPINIRole (REQUIP) 0.5 MG tablet     Immunization due       Relevant Orders   Flu vaccine HIGH DOSE PF(Fluzone Trivalent) (Completed)     Bilateral leg pain         Popliteal cyst, left       Relevant Orders   US  Venous Img Lower Bilateral (DVT)       Assessment and Plan Assessment & Plan Restless legs syndrome and peripheral neuropathy Acute problem  Reports cramp and tingling pain in both legs for one week, constant and worse in the mornings. Symptoms include feeling of walking on marbles and cramping in feet. Differential diagnosis includes restless legs syndrome and peripheral neuropathy. Consideration of vitamin deficiencies (B12, B6) and thyroid  issues, though thyroid  function was normal three months ago. Peripheral arterial disease considered but unlikely due to symptom pattern. Recent back injection for compressed nerve noted. - Start B complex vitamins to address potential vitamin deficiencies. - Prescribed Requip 0.5 mg, to be taken at night initially, with potential to increase to three times a day if tolerated. - Ordered ultrasound of lower extremities to evaluate for vascular issues. - Will consider referral to vascular specialist or neurosurgery if ultrasound is negative and symptoms persist.  Pain in right leg Reports crampy pain in right lower extremity, particularly with extension while lying supine. Differential includes nerve compression or vascular issues. Recent back injection for compressed nerve noted. - Ordered ultrasound of lower extremities  to evaluate for vascular issues. - Will consider referral to vascular specialist or neurosurgery if ultrasound is negative and symptoms persist.  ?Synovial cyst of popliteal space, left knee Left popliteal swelling is soft, cystic, non-tender, and without erythema. Normal range of motion in bilateral knees and hips. No cystic swelling in right popliteal region. - Ordered ultrasound of lower extremities to evaluate for cystic structures.  Encounter for immunization Received flu shot today. - Documented flu shot administration.     Return in about 1 month (around 04/28/2024), or if symptoms worsen or fail to improve.         Rockie Agent, MD  [facility] [phone] (phone) [phone] (fax)  [facility]

## 2024-03-29 NOTE — Patient Instructions (Signed)
 To keep you healthy, please keep in mind the following health maintenance items that you are due for:   Health Maintenance Due  Topic Date Due   Medicare Annual Wellness (AWV)  09/23/2023     Best Wishes,   Dr. Lang

## 2024-04-01 DIAGNOSIS — M5416 Radiculopathy, lumbar region: Secondary | ICD-10-CM | POA: Diagnosis not present

## 2024-04-04 ENCOUNTER — Other Ambulatory Visit: Payer: Self-pay | Admitting: Family Medicine

## 2024-04-05 ENCOUNTER — Ambulatory Visit

## 2024-04-06 ENCOUNTER — Ambulatory Visit (INDEPENDENT_AMBULATORY_CARE_PROVIDER_SITE_OTHER): Admitting: Emergency Medicine

## 2024-04-06 ENCOUNTER — Ambulatory Visit
Admission: RE | Admit: 2024-04-06 | Discharge: 2024-04-06 | Disposition: A | Discharge status: Home / Self Care | Source: Ambulatory Visit | Attending: Family Medicine | Admitting: Family Medicine

## 2024-04-06 VITALS — Ht 72.0 in | Wt 227.0 lb

## 2024-04-06 DIAGNOSIS — M7989 Other specified soft tissue disorders: Secondary | ICD-10-CM | POA: Diagnosis not present

## 2024-04-06 DIAGNOSIS — Z0000 Encounter for general adult medical examination without abnormal findings: Secondary | ICD-10-CM | POA: Diagnosis not present

## 2024-04-06 DIAGNOSIS — M7122 Synovial cyst of popliteal space [Baker], left knee: Secondary | ICD-10-CM | POA: Insufficient documentation

## 2024-04-06 DIAGNOSIS — R600 Localized edema: Secondary | ICD-10-CM | POA: Diagnosis not present

## 2024-04-06 DIAGNOSIS — M79605 Pain in left leg: Secondary | ICD-10-CM | POA: Diagnosis not present

## 2024-04-06 DIAGNOSIS — M79604 Pain in right leg: Secondary | ICD-10-CM | POA: Diagnosis not present

## 2024-04-06 NOTE — Progress Notes (Signed)
 Chief Complaint  Patient presents with   Medicare Wellness     Subjective:   Jack Weaver is a 68 y.o. male who presents for a Medicare Annual Wellness Visit.  Allergies (verified) Patient has no known allergies.   History: Past Medical History:  Diagnosis Date   AAA (abdominal aortic aneurysm)    Arthritis    Cervical radiculopathy    Enlarged LA (left atrium)    Gastritis without bleeding    GERD (gastroesophageal reflux disease)    Grade II diastolic dysfunction 04/17/2020   Hematuria    Hypertension    Infrarenal abdominal aortic aneurysm (AAA) without rupture 03/18/2023   3.8 cm infrarenal abdominal aortic aneurysm. Recommend surveillance ultrasound in 3 years.(2027)   Leg cramps    Moderate left ventricular hypertrophy 04/17/2020   Moderate mitral regurgitation    Moderate tricuspid regurgitation by prior echocardiogram    Pre-diabetes    Prostate cancer [facility])    Sleep apnea    Trigeminal neuralgia    Past Surgical History:  Procedure Laterality Date   APPENDECTOMY     At age 89   CATARACT EXTRACTION W/PHACO Left 08/08/2020   Procedure: CATARACT EXTRACTION PHACO AND INTRAOCULAR LENS PLACEMENT (IOC) LEFT 5.62 01:00.4 903%;  Surgeon: Mittie Gaskin, MD;  Location: [facility];  Service: Ophthalmology;  Laterality: Left;   CATARACT EXTRACTION W/PHACO Right 03/09/2024   Procedure: PHACOEMULSIFICATION, CATARACT, WITH IOL INSERTION 3.69 00:33.2;  Surgeon: Mittie Gaskin, MD;  Location: [facility];  Service: Ophthalmology;  Laterality: Right;   COLONOSCOPY N/A 09/17/2023   Procedure: COLONOSCOPY;  Surgeon: Therisa Bi, MD;  Location: [facility] ENDOSCOPY;  Service: Gastroenterology;  Laterality: N/A;   HEMORROIDECTOMY  1930   PELVIC LYMPH NODE DISSECTION Bilateral 10/11/2018   Procedure: PELVIC LYMPH NODE DISSECTION;  Surgeon: Penne Knee, MD;  Location: [facility] ORS;  Service: Urology;  Laterality: Bilateral;   QUADRICEPS TENDON REPAIR Left  09/09/2017   Procedure: REPAIR QUADRICEP TENDON;  Surgeon: Cleotilde Barrio, MD;  Location: [facility] ORS;  Service: Orthopedics;  Laterality: Left;   ROBOT ASSISTED LAPAROSCOPIC RADICAL PROSTATECTOMY N/A 10/11/2018   Procedure: ROBOTIC ASSISTED LAPAROSCOPIC RADICAL PROSTATECTOMY;  Surgeon: Penne Knee, MD;  Location: [facility] ORS;  Service: Urology;  Laterality: N/A;   Family History  Problem Relation Age of Onset   Heart attack Mother    Stroke Father    Asthma Father    Cancer Brother    Diabetes Brother    Prostate cancer Neg Hx    Kidney cancer Neg Hx    Bladder Cancer Neg Hx    Social History   Occupational History   Not on file  Tobacco Use   Smoking status: Some Days    Types: Cigars   Smokeless tobacco: Never  Vaping Use   Vaping status: Never Used  Substance and Sexual Activity   Alcohol use: Yes    Alcohol/week: 3.0 standard drinks of alcohol    Types: 3 Cans of beer per week    Comment: 1 beer qod   Drug use: Never   Sexual activity: Not on file   Tobacco Counseling Ready to quit: Not Answered Counseling given: Not Answered  SDOH Screenings   Food Insecurity: No Food Insecurity (04/06/2024)  Housing: Low Risk  (04/06/2024)  Transportation Needs: No Transportation Needs (04/06/2024)  Utilities: Not At Risk (04/06/2024)  Alcohol Screen: Low Risk  (07/24/2022)  Depression (PHQ2-9): Low Risk  (04/06/2024)  Financial Resource Strain: Low Risk  (09/23/2022)  Physical Activity: Sufficiently Active (04/06/2024)  Social Connections: Moderately Integrated (04/06/2024)  Stress: No Stress Concern Present (04/06/2024)  Tobacco Use: High Risk (04/06/2024)  Health Literacy: Adequate Health Literacy (04/06/2024)   See flowsheets for full screening details  Depression Screen PHQ 2 & 9 Depression Scale- Over the past 2 weeks, how often have you been bothered by any of the following problems? Little interest or pleasure in doing things: 0 Feeling down, depressed, or hopeless  (PHQ Adolescent also includes...irritable): 0 PHQ-2 Total Score: 0 Trouble falling or staying asleep, or sleeping too much: 2 (due to leg cramps) Feeling tired or having little energy: 0 Poor appetite or overeating (PHQ Adolescent also includes...weight loss): 0 Feeling bad about yourself - or that you are a failure or have let yourself or your family down: 0 Trouble concentrating on things, such as reading the newspaper or watching television (PHQ Adolescent also includes...like school work): 0 Moving or speaking so slowly that other people could have noticed. Or the opposite - being so fidgety or restless that you have been moving around a lot more than usual: 0 Thoughts that you would be better off dead, or of hurting yourself in some way: 0 PHQ-9 Total Score: 2 If you checked off any problems, how difficult have these problems made it for you to do your work, take care of things at home, or get along with other people?: Not difficult at all  Depression Treatment Depression Interventions/Treatment : EYV7-0 Score <4 Follow-up Not Indicated     Goals Addressed               This Visit's Progress     Increase physical activity (pt-stated)         Visit info / Clinical Intake: Medicare Wellness Visit Type:: Subsequent Annual Wellness Visit Persons participating in visit:: patient Medicare Wellness Visit Mode:: Telephone If telephone:: video declined Because this visit was a virtual/telehealth visit:: pt reported vitals If Telephone or Video please confirm:: I connected with the patient using audio enabled telemedicine application and verified that I am speaking with the correct person using two identifiers; I discussed the limitations of evaluation and management by telemedicine; The patient expressed understanding and agreed to proceed Patient Location:: home Provider Location:: home Information given by:: patient Interpreter Needed?: No Pre-visit prep was completed: yes AWV  questionnaire completed by patient prior to visit?: no Living arrangements:: lives with spouse/significant other Patient's Overall Health Status Rating: good Typical amount of pain: some (leg cramps) Does pain affect daily life?: (!) yes (affects sleeping) Are you currently prescribed opioids?: no  Dietary Habits and Nutritional Risks How many meals a day?: 2 Eats fruit and vegetables daily?: yes Most meals are obtained by: eating out In the last 2 weeks, have you had any of the following?: (!) nausea, vomiting, diarrhea (due to Metformin ) Diabetic:: no  Functional Status Activities of Daily Living (to include ambulation/medication): Independent Ambulation: Independent Medication Administration: Independent Home Management: Independent Manage your own finances?: yes Primary transportation is: driving Concerns about vision?: no *vision screening is required for WTM* Concerns about hearing?: no  Fall Screening Falls in the past year?: 0 Number of falls in past year: 0 Was there an injury with Fall?: 0 Fall Risk Category Calculator: 0 Patient Fall Risk Level: Low Fall Risk  Fall Risk Patient at Risk for Falls Due to: No Fall Risks Fall risk Follow up: Falls evaluation completed  Home and Transportation Safety: All rugs have non-skid backing?: yes All stairs or steps have railings?: (!) no (1 step  without railing) Grab bars in the bathtub or shower?: yes Have non-skid surface in bathtub or shower?: yes Good home lighting?: yes Regular seat belt use?: yes Hospital stays in the last year:: no  Cognitive Assessment Difficulty concentrating, remembering, or making decisions? : no Will 6CIT or Mini Cog be Completed: yes What year is it?: 0 points What month is it?: 0 points Give patient an address phrase to remember (5 components): [address] [place] About what time is it?: 0 points Count backwards from 20 to 1: 0 points Say the months of the year in reverse: 4  points Repeat the address phrase from earlier: 0 points 6 CIT Score: 4 points  Advance Directives (For Healthcare) Does Patient Have a Medical Advance Directive?: Yes Does patient want to make changes to medical advance directive?: No - Patient declined Type of Advance Directive: Living will Copy of Healthcare Power of Attorney in Chart?: No - copy requested Copy of Living Will in Chart?: Yes - validated most recent copy scanned in chart (See row information)  Reviewed/Updated  Reviewed/Updated: Reviewed All (Medical, Surgical, Family, Medications, Allergies, Care Teams, Patient Goals)        Objective:    Today's Vitals   04/06/24 1436  Weight: 227 lb (103 kg)  Height: 6' (1.829 m)   Body mass index is 30.79 kg/m.  Current Medications (verified) Outpatient Encounter Medications as of 04/06/2024  Medication Sig   amLODipine  (NORVASC ) 10 MG tablet Take 1 tablet (10 mg total) by mouth daily.   fluticasone  (FLONASE ) 50 MCG/ACT nasal spray  (Patient taking differently: Taking PRN)   losartan  (COZAAR ) 50 MG tablet TAKE 1 TABLET EVERY DAY   metoprolol  succinate (TOPROL -XL) 25 MG 24 hr tablet TAKE 1 TABLET EVERY DAY   Multiple Vitamin (MULTIVITAMIN WITH MINERALS) TABS tablet Take 1 tablet by mouth daily.   omeprazole  (PRILOSEC) 40 MG capsule TAKE 1 CAPSULE DAILY AS NEEDED (ACID REFLUX, INDIGESTION)   metFORMIN  (GLUCOPHAGE -XR) 500 MG 24 hr tablet Take 500 mg by mouth daily. (Patient not taking: Reported on 04/06/2024)   rOPINIRole (REQUIP) 0.5 MG tablet Take 1 tablet (0.5 mg total) by mouth at bedtime. (Patient not taking: Reported on 04/06/2024)   No facility-administered encounter medications on file as of 04/06/2024.   Hearing/Vision screen Hearing Screening - Comments:: Denies hearing loss  Vision Screening - Comments:: UTD @ [facility] [place], Dr. Mittie Immunizations and Health Maintenance Health Maintenance  Topic Date Due   COVID-19 Vaccine (5 - 2025-26  season) 04/14/2024 (Originally 01/25/2024)   Zoster Vaccines- Shingrix (1 of 2) 06/29/2024 (Originally 08/27/1974)   Pneumococcal Vaccine: 50+ Years (2 of 2 - PCV) 03/29/2025 (Originally 11/24/2019)   Medicare Annual Wellness (AWV)  04/06/2025   DTaP/Tdap/Td (5 - Td or Tdap) 01/01/2031   Colonoscopy  09/16/2033   Influenza Vaccine  Completed   Hepatitis C Screening  Completed   Meningococcal B Vaccine  Aged Out        Assessment/Plan:  This is a routine wellness examination for Jack Weaver.  Patient Care Team: Sharma Coyer, MD as PCP - General (Family Medicine) End, Lonni, MD as PCP - Cardiology (Cardiology) Mittie Gaskin, MD as Referring Physician (Ophthalmology) Maurine Lukes, PA-C as Physician Assistant (Urology) Therisa Bi, MD as Consulting Physician (Gastroenterology)  I have personally reviewed and noted the following in the patient's chart:   Medical and social history Use of alcohol, tobacco or illicit drugs  Current medications and supplements including opioid prescriptions. Functional ability and status Nutritional  status Physical activity Advanced directives List of other physicians Hospitalizations, surgeries, and ER visits in previous 12 months Vitals Screenings to include cognitive, depression, and falls Referrals and appointments  No orders of the defined types were placed in this encounter.  In addition, I have reviewed and discussed with patient certain preventive protocols, quality metrics, and best practice recommendations. A written personalized care plan for preventive services as well as general preventive health recommendations were provided to patient.   Vina Ned, CMA   04/06/2024   Return in 53 weeks (on 04/12/2025).  After Visit Summary: (MyChart) Due to this being a telephonic visit, the after visit summary with patients personalized plan was offered to patient via MyChart   Nurse Notes:  6 CIT Score - 4 Needs  pneumonia vaccine Needs Covid vaccine (pharmacy) Declined shingles vaccine Patient stopped Metformin  due to GI upset. Patient stopped Requip due to it not helping with his leg cramps.

## 2024-04-06 NOTE — Patient Instructions (Signed)
 Jack Weaver,  Thank you for taking the time for your Medicare Wellness Visit. I appreciate your continued commitment to your health goals. Please review the care plan we discussed, and feel free to reach out if I can assist you further.  Please note that Annual Wellness Visits do not include a physical exam. Some assessments may be limited, especially if the visit was conducted virtually. If needed, we may recommend an in-person follow-up with your provider.  Ongoing Care Seeing your primary care provider every 3 to 6 months helps us  monitor your health and provide consistent, personalized care.   Referrals If a referral was made during today's visit and you haven't received any updates within two weeks, please contact the referred provider directly to check on the status.  Recommended Screenings:    You may get the covid vaccine at your local pharmacy at your convenience.  You may get the pneumonia vaccine at your doctor's office or at your local pharmacy at your convenience.   Health Maintenance  Topic Date Due   Medicare Annual Wellness Visit  09/23/2023   COVID-19 Vaccine (5 - 2025-26 season) 04/14/2024*   Zoster (Shingles) Vaccine (1 of 2) 06/29/2024*   Pneumococcal Vaccine for age over 70 (2 of 2 - PCV) 03/29/2025*   DTaP/Tdap/Td vaccine (5 - Td or Tdap) 01/01/2031   Colon Cancer Screening  09/16/2033   Flu Shot  Completed   Hepatitis C Screening  Completed   Meningitis B Vaccine  Aged Out  *Topic was postponed. The date shown is not the original due date.       04/06/2024    2:43 PM  Advanced Directives  Does Patient Have a Medical Advance Directive? Yes  Type of Advance Directive Living will  Does patient want to make changes to medical advance directive? No - Patient declined    Vision: Annual vision screenings are recommended for early detection of glaucoma, cataracts, and diabetic retinopathy. These exams can also reveal signs of chronic conditions such as diabetes  and high blood pressure.  Dental: Annual dental screenings help detect early signs of oral cancer, gum disease, and other conditions linked to overall health, including heart disease and diabetes.  Please see the attached documents for additional preventive care recommendations.   Fall Prevention in the Home, Adult Falls can cause injuries and affect people of all ages. There are many simple things that you can do to make your home safe and to help prevent falls. If you need it, ask for help making these changes. What actions can I take to prevent falls? General information Use good lighting in all rooms. Make sure to: Replace any light bulbs that burn out. Turn on lights if it is dark and use night-lights. Keep items that you use often in easy-to-reach places. Lower the shelves around your home if needed. Move furniture so that there are clear paths around it. Do not keep throw rugs or other things on the floor that can make you trip. If any of your floors are uneven, fix them. Add color or contrast paint or tape to clearly mark and help you see: Grab bars or handrails. First and last steps of staircases. Where the edge of each step is. If you use a ladder or stepladder: Make sure that it is fully opened. Do not climb a closed ladder. Make sure the sides of the ladder are locked in place. Have someone hold the ladder while you use it. Know where your pets are as you  move through your home. What can I do in the bathroom?     Keep the floor dry. Clean up any water that is on the floor right away. Remove soap buildup in the bathtub or shower. Buildup makes bathtubs and showers slippery. Use non-skid mats or decals on the floor of the bathtub or shower. Attach bath mats securely with double-sided, non-slip rug tape. If you need to sit down while you are in the shower, use a non-slip stool. Install grab bars by the toilet and in the bathtub and shower. Do not use towel bars as grab  bars. What can I do in the bedroom? Make sure that you have a light by your bed that is easy to reach. Do not use any sheets or blankets on your bed that hang to the floor. Have a firm bench or chair with side arms that you can use for support when you get dressed. What can I do in the kitchen? Clean up any spills right away. If you need to reach something above you, use a sturdy step stool that has a grab bar. Keep electrical cables out of the way. Do not use floor polish or wax that makes floors slippery. What can I do with my stairs? Do not leave anything on the stairs. Make sure that you have a light switch at the top and the bottom of the stairs. Have them installed if you do not have them. Make sure that there are handrails on both sides of the stairs. Fix handrails that are broken or loose. Make sure that handrails are as long as the staircases. Install non-slip stair treads on all stairs in your home if they do not have carpet. Avoid having throw rugs at the top or bottom of stairs, or secure the rugs with carpet tape to prevent them from moving. Choose a carpet design that does not hide the edge of steps on the stairs. Make sure that carpet is firmly attached to the stairs. Fix any carpet that is loose or worn. What can I do on the outside of my home? Use bright outdoor lighting. Repair the edges of walkways and driveways and fix any cracks. Clear paths of anything that can make you trip, such as tools or rocks. Add color or contrast paint or tape to clearly mark and help you see high doorway thresholds. Trim any bushes or trees on the main path into your home. Check that handrails are securely fastened and in good repair. Both sides of all steps should have handrails. Install guardrails along the edges of any raised decks or porches. Have leaves, snow, and ice cleared regularly. Use sand, salt, or ice melt on walkways during winter months if you live where there is ice and snow. In  the garage, clean up any spills right away, including grease or oil spills. What other actions can I take? Review your medicines with your health care provider. Some medicines can make you confused or feel dizzy. This can increase your chance of falling. Wear closed-toe shoes that fit well and support your feet. Wear shoes that have rubber soles and low heels. Use a cane, walker, scooter, or crutches that help you move around if needed. Talk with your provider about other ways that you can decrease your risk of falls. This may include seeing a physical therapist to learn to do exercises to improve movement and strength. Where to find more information Centers for Disease Control and Prevention, STEADI: tonerpromos.no [employer] on Aging:  baseringtones.pl [employer] on Aging: baseringtones.pl Contact a health care provider if: You are afraid of falling at home. You feel weak, drowsy, or dizzy at home. You fall at home. Get help right away if you: Lose consciousness or have trouble moving after a fall. Have a fall that causes a head injury. These symptoms may be an emergency. Get help right away. Call 911. Do not wait to see if the symptoms will go away. Do not drive yourself to the hospital. This information is not intended to replace advice given to you by your health care provider. Make sure you discuss any questions you have with your health care provider. Document Revised: 01/13/2022 Document Reviewed: 01/13/2022 Elsevier Patient Education  2024 [employer].

## 2024-04-13 ENCOUNTER — Ambulatory Visit: Payer: Self-pay | Admitting: Family Medicine

## 2024-04-21 ENCOUNTER — Other Ambulatory Visit: Payer: Self-pay | Admitting: Medical

## 2024-04-21 DIAGNOSIS — I10 Essential (primary) hypertension: Secondary | ICD-10-CM

## 2024-05-02 ENCOUNTER — Telehealth: Payer: Self-pay | Admitting: Family Medicine

## 2024-05-02 NOTE — Telephone Encounter (Signed)
 Created in error

## 2024-05-05 ENCOUNTER — Telehealth: Payer: Self-pay

## 2024-05-05 NOTE — Telephone Encounter (Signed)
 Please inform them form has not been received. Please re-send

## 2024-05-05 NOTE — Telephone Encounter (Signed)
 Copied from CRM #[id number]. Topic: General - Other >> May 05, 2024  8:12 AM Myrick T wrote: Reason for CRM: Sabrina from [facility] called to verify if the HEDIS STAR MEASURE STATIN REQUEST was recvd. She is asking that the form be completed and sent back asap to let them know to leave therapy as is or send in the change. Please f/u to the phn# listed on the form or fax completed form back with instructions

## 2024-06-29 ENCOUNTER — Encounter: Payer: Self-pay | Admitting: Internal Medicine

## 2024-06-29 ENCOUNTER — Ambulatory Visit: Admitting: Internal Medicine

## 2024-06-29 VITALS — BP 154/88 | HR 59 | Ht 71.0 in | Wt 245.2 lb

## 2024-06-29 DIAGNOSIS — I10 Essential (primary) hypertension: Secondary | ICD-10-CM

## 2024-06-29 DIAGNOSIS — R002 Palpitations: Secondary | ICD-10-CM

## 2024-06-29 MED ORDER — LOSARTAN POTASSIUM 100 MG PO TABS: 100.0000 mg | ORAL_TABLET | Freq: Every day | ORAL | 3 refills | Status: AC

## 2024-06-29 NOTE — Patient Instructions (Signed)
 Medication Instructions:  Your physician recommends the following medication changes.  INCREASE: Losartan  100 mg by mouth daily    *If you need a refill on your cardiac medications before your next appointment, please call your pharmacy*  Lab Work: Your provider would like for you to have following labs drawn today BMP.    Your provider would like for you to return in 2 weeks to have the following labs drawn: BMP.   Please go to [facility] [address] [address], [place] [address] You do not need an appointment.  They are open from 8 am- 4:30 pm.  Lunch from 1:00 pm- 2:00 pm You will not need to be fasting.    Testing/Procedures: No test ordered today   Follow-Up: At [facility], you and your health needs are our priority.  As part of our continuing mission to provide you with exceptional heart care, our providers are all part of one team.  This team includes your primary Cardiologist (physician) and Advanced Practice Providers or APPs (Physician Assistants and Nurse Practitioners) who all work together to provide you with the care you need, when you need it.  Your next appointment:   3 month(s)  Provider:   You may see Lonni Hanson, MD or one of the following Advanced Practice Providers on your designated Care Team:   Lonni Meager, NP Lesley Maffucci, PA-C Bernardino Bring, PA-C Cadence Darling, PA-C Tylene Lunch, NP Barnie Hila, NP

## 2024-06-29 NOTE — Progress Notes (Unsigned)
" °  Cardiology Office Note:  .   Date:  06/30/2024  ID:  Jack Weaver, DOB [birth year], MRN [id number] PCP: Sharma Coyer, MD  [facility] Providers Cardiologist:  Lonni Hanson, MD     History of Present Illness: .   Jack Weaver is a 69 y.o. male with history of hypertension and prostate cancer status post prostatectomy, who presents for follow-up of hypertension.  He was last seen in our office a year ago by [employer], PA, at which time he was doing well from a heart standpoint but was dealing with sciatica.  His blood pressure was well-controlled.  No further testing or intervention was undertaken.  Today, Mr. Poffenberger reports that he has been feeling fairly well.  He has had some dizziness recently, which he attributes to a sinus infection that began a week or 2 ago.  He is scheduled to see his PCP next week for further evaluation.  He denies fevers or chills.  He has not had any chest pain, shortness of breath, palpitations, or edema.  He notes that his weight has been going up.  He is trying to minimize his sodium intake.  ROS: See HPI  Studies Reviewed: SABRA   EKG Interpretation Date/Time:  Wednesday June 29 2024 09:13:12 EST Ventricular Rate:  59 PR Interval:  160 QRS Duration:  96 QT Interval:  408 QTC Calculation: 403 R Axis:   44  Text Interpretation: Sinus bradycardia Nonspecific T wave abnormality Abnormal ECG When compared with ECG of 22-Jun-2023 08:55, No significant change was found Confirmed by Darcell Sabino ([id number]) on 06/30/2024 11:55:45 AM    Risk Assessment/Calculations:     HYPERTENSION CONTROL Vitals:   06/29/24 0906 06/29/24 0953  BP: (!) 148/88 (!) 154/88    The patient's blood pressure is elevated above target today.  In order to address the patient's elevated BP: A current anti-hypertensive medication was adjusted today.          Physical Exam:   VS:  BP (!) 154/88 (BP Location: Left Arm, Cuff Size: Normal)   Pulse (!) 59  Comment: 64 oximeter  Ht 5' 11 (1.803 m)   Wt 245 lb 3.2 oz (111.2 kg)   SpO2 99%   BMI 34.20 kg/m    Wt Readings from Last 3 Encounters:  06/29/24 245 lb 3.2 oz (111.2 kg)  04/06/24 227 lb (103 kg)  03/29/24 239 lb 3.2 oz (108.5 kg)    General:  NAD. Neck: No JVD or HJR. Lungs: Clear to auscultation bilaterally without wheezes or crackles. Heart: Regular rate and rhythm without murmurs, rubs, or gallops. Abdomen: Soft, nontender, nondistended. Extremities: No lower extremity edema.  ASSESSMENT AND PLAN: .    Hypertension: Blood pressure suboptimally controlled today.  We have agreed to increase losartan  to 100 mg daily.  I will check a BMP today and again in about 2 weeks to ensure stable renal function and potassium.  Continued sodium restriction encouraged.    Dispo: Return to clinic in 3 months.  Signed, Lonni Hanson, MD  "

## 2024-06-30 ENCOUNTER — Other Ambulatory Visit: Payer: Self-pay | Admitting: Family Medicine

## 2024-06-30 ENCOUNTER — Ambulatory Visit: Payer: Self-pay | Admitting: Internal Medicine

## 2024-06-30 ENCOUNTER — Encounter: Payer: Self-pay | Admitting: Internal Medicine

## 2024-06-30 DIAGNOSIS — E669 Obesity, unspecified: Secondary | ICD-10-CM

## 2024-06-30 DIAGNOSIS — R635 Abnormal weight gain: Secondary | ICD-10-CM

## 2024-06-30 LAB — BASIC METABOLIC PANEL WITH GFR
BUN/Creatinine Ratio: 15 (ref 10–24)
BUN: 15 mg/dL (ref 8–27)
CO2: 21 mmol/L (ref 20–29)
Calcium: 9.5 mg/dL (ref 8.6–10.2)
Chloride: 105 mmol/L (ref 96–106)
Creatinine, Ser: 0.98 mg/dL (ref 0.76–1.27)
Glucose: 89 mg/dL (ref 70–99)
Potassium: 4.6 mmol/L (ref 3.5–5.2)
Sodium: 139 mmol/L (ref 134–144)
eGFR: 83 mL/min/{1.73_m2}

## 2024-07-07 ENCOUNTER — Ambulatory Visit: Admitting: Family Medicine

## 2024-09-15 ENCOUNTER — Ambulatory Visit: Admitting: Urology

## 2024-09-28 ENCOUNTER — Ambulatory Visit: Admitting: Internal Medicine

## 2025-04-12 ENCOUNTER — Ambulatory Visit
